# Patient Record
Sex: Female | Born: 1985 | Race: Black or African American | Hispanic: No | Marital: Single | Smoking: Never smoker
Health system: Southern US, Community
[De-identification: ages and names within clinical notes are randomized; demographics above are authoritative.]

## PROBLEM LIST (undated history)

## (undated) ENCOUNTER — Inpatient Hospital Stay (HOSPITAL_COMMUNITY): Payer: Self-pay

## (undated) DIAGNOSIS — R51 Headache: Secondary | ICD-10-CM

## (undated) DIAGNOSIS — J45909 Unspecified asthma, uncomplicated: Secondary | ICD-10-CM

## (undated) DIAGNOSIS — F32A Depression, unspecified: Secondary | ICD-10-CM

## (undated) DIAGNOSIS — F3181 Bipolar II disorder: Secondary | ICD-10-CM

## (undated) DIAGNOSIS — F419 Anxiety disorder, unspecified: Secondary | ICD-10-CM

## (undated) HISTORY — DX: Anxiety disorder, unspecified: F41.9

## (undated) HISTORY — DX: Bipolar II disorder: F31.81

## (undated) HISTORY — DX: Depression, unspecified: F32.A

## (undated) HISTORY — PX: WISDOM TOOTH EXTRACTION: SHX21

---
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
## 2006-01-21 ENCOUNTER — Ambulatory Visit: Payer: Self-pay | Admitting: Sports Medicine

## 2006-01-24 ENCOUNTER — Ambulatory Visit: Payer: Self-pay | Admitting: Family Medicine

## 2006-01-25 ENCOUNTER — Ambulatory Visit: Payer: Self-pay | Admitting: Family Medicine

## 2006-11-14 DIAGNOSIS — N912 Amenorrhea, unspecified: Secondary | ICD-10-CM | POA: Insufficient documentation

## 2007-01-01 ENCOUNTER — Telehealth: Payer: Self-pay | Admitting: *Deleted

## 2007-01-03 ENCOUNTER — Encounter (INDEPENDENT_AMBULATORY_CARE_PROVIDER_SITE_OTHER): Payer: Self-pay | Admitting: Family Medicine

## 2007-01-03 ENCOUNTER — Ambulatory Visit: Payer: Self-pay | Admitting: Family Medicine

## 2007-01-03 LAB — CONVERTED CEMR LAB
Basophils Absolute: 0 10*3/uL (ref 0.0–0.1)
Beta hcg, urine, semiquantitative: POSITIVE
Eosinophils Absolute: 0.1 10*3/uL (ref 0.0–0.7)
Hepatitis B Surface Ag: NEGATIVE
Lymphocytes Relative: 16 % (ref 12–46)
Lymphs Abs: 1.4 10*3/uL (ref 0.7–3.3)
Neutrophils Relative %: 76 % (ref 43–77)
Platelets: 277 10*3/uL (ref 150–400)
Rh Type: POSITIVE
WBC: 8.7 10*3/uL (ref 4.0–10.5)

## 2007-01-04 ENCOUNTER — Encounter (INDEPENDENT_AMBULATORY_CARE_PROVIDER_SITE_OTHER): Payer: Self-pay | Admitting: Family Medicine

## 2007-01-06 ENCOUNTER — Encounter (INDEPENDENT_AMBULATORY_CARE_PROVIDER_SITE_OTHER): Payer: Self-pay | Admitting: Family Medicine

## 2007-01-07 ENCOUNTER — Ambulatory Visit (HOSPITAL_COMMUNITY): Admission: RE | Admit: 2007-01-07 | Discharge: 2007-01-07 | Payer: Self-pay | Admitting: Family Medicine

## 2007-01-07 ENCOUNTER — Encounter (INDEPENDENT_AMBULATORY_CARE_PROVIDER_SITE_OTHER): Payer: Self-pay | Admitting: Family Medicine

## 2007-01-21 ENCOUNTER — Ambulatory Visit: Payer: Self-pay | Admitting: Family Medicine

## 2007-01-21 ENCOUNTER — Encounter (INDEPENDENT_AMBULATORY_CARE_PROVIDER_SITE_OTHER): Payer: Self-pay | Admitting: Family Medicine

## 2007-01-21 ENCOUNTER — Other Ambulatory Visit: Admission: RE | Admit: 2007-01-21 | Discharge: 2007-01-21 | Payer: Self-pay | Admitting: Family Medicine

## 2007-01-23 ENCOUNTER — Ambulatory Visit: Payer: Self-pay | Admitting: Family Medicine

## 2007-01-23 DIAGNOSIS — A5400 Gonococcal infection of lower genitourinary tract, unspecified: Secondary | ICD-10-CM | POA: Insufficient documentation

## 2007-01-23 DIAGNOSIS — A5609 Other chlamydial infection of lower genitourinary tract: Secondary | ICD-10-CM

## 2007-01-23 LAB — CONVERTED CEMR LAB: GC Probe Amp, Genital: POSITIVE — AB

## 2007-01-27 ENCOUNTER — Encounter (INDEPENDENT_AMBULATORY_CARE_PROVIDER_SITE_OTHER): Payer: Self-pay | Admitting: Family Medicine

## 2007-01-27 LAB — CONVERTED CEMR LAB: Pap Smear: NORMAL

## 2007-03-07 ENCOUNTER — Telehealth: Payer: Self-pay | Admitting: *Deleted

## 2007-04-11 ENCOUNTER — Encounter (INDEPENDENT_AMBULATORY_CARE_PROVIDER_SITE_OTHER): Payer: Self-pay | Admitting: *Deleted

## 2007-04-11 ENCOUNTER — Ambulatory Visit: Payer: Self-pay | Admitting: Family Medicine

## 2007-04-11 LAB — CONVERTED CEMR LAB
GC Probe Amp, Genital: NEGATIVE
Glucose, Bld: 76 mg/dL (ref 70–99)
Potassium: 3.9 meq/L (ref 3.5–5.3)
Sodium: 138 meq/L (ref 135–145)

## 2007-04-14 ENCOUNTER — Encounter (INDEPENDENT_AMBULATORY_CARE_PROVIDER_SITE_OTHER): Payer: Self-pay | Admitting: Family Medicine

## 2007-04-14 ENCOUNTER — Ambulatory Visit (HOSPITAL_COMMUNITY): Admission: RE | Admit: 2007-04-14 | Discharge: 2007-04-14 | Payer: Self-pay | Admitting: Family Medicine

## 2007-04-23 ENCOUNTER — Ambulatory Visit: Payer: Self-pay | Admitting: Family Medicine

## 2007-04-29 ENCOUNTER — Ambulatory Visit: Payer: Self-pay | Admitting: Family Medicine

## 2007-05-05 ENCOUNTER — Ambulatory Visit: Payer: Self-pay | Admitting: Obstetrics and Gynecology

## 2007-05-05 ENCOUNTER — Telehealth: Payer: Self-pay | Admitting: Family Medicine

## 2007-05-05 ENCOUNTER — Inpatient Hospital Stay (HOSPITAL_COMMUNITY): Admission: AD | Admit: 2007-05-05 | Discharge: 2007-05-07 | Payer: Self-pay | Admitting: Family Medicine

## 2007-06-12 ENCOUNTER — Ambulatory Visit: Payer: Self-pay | Admitting: Sports Medicine

## 2009-01-18 IMAGING — US US OB FOLLOW-UP
1 series · 14 of 23 positions shown · non-contrast
Comparison: none

OBSTETRICAL ULTRASOUND:

 This ultrasound exam was performed in the [HOSPITAL] Ultrasound Department.  The OB US report was generated in the AS system, and faxed to the ordering physician.  This report is also available in [REDACTED] PACS.

[Series 1: us ob re-eval · 14 of 23 slices shown]
[im 1/23]
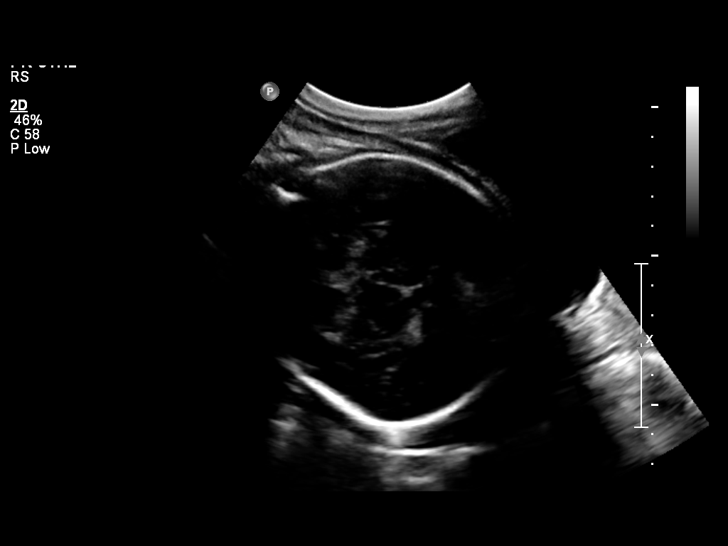
[im 3/23]
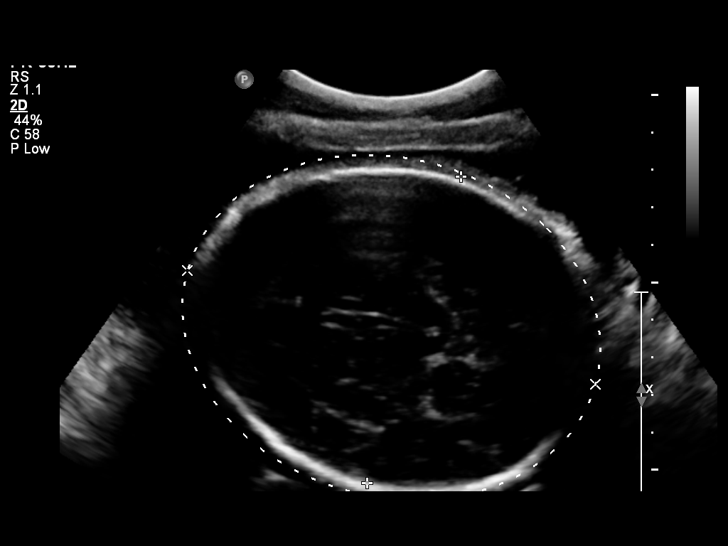
[im 5/23]
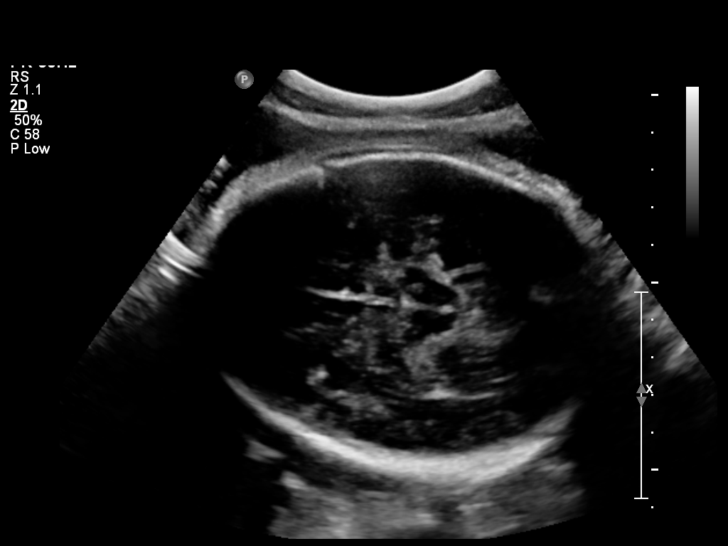
[im 6/23]
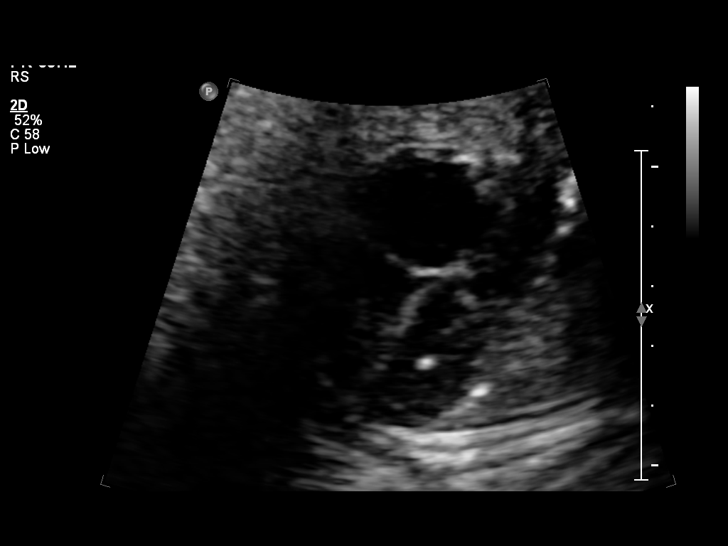
[im 8/23]
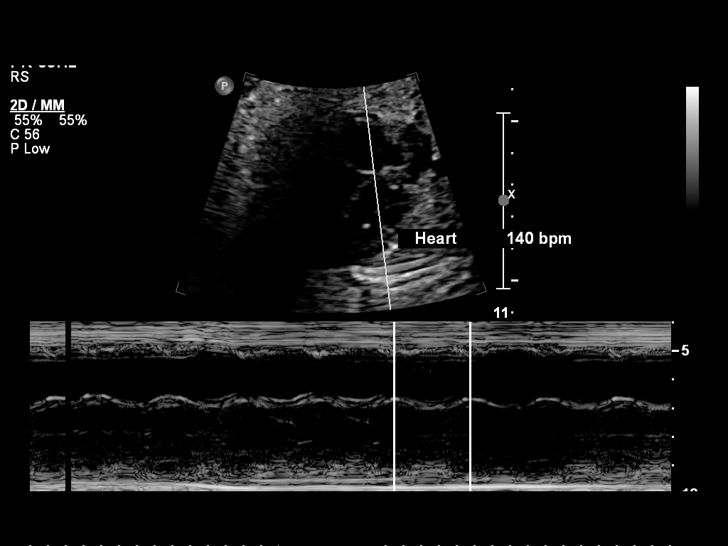
[im 10/23]
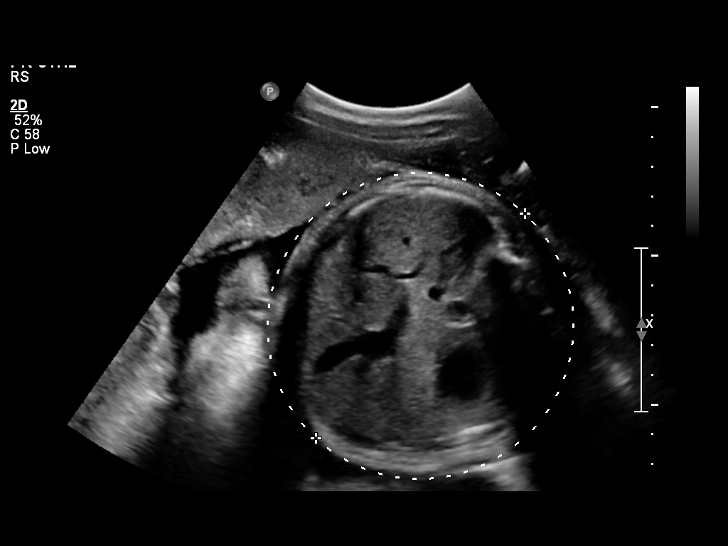
[im 11/23]
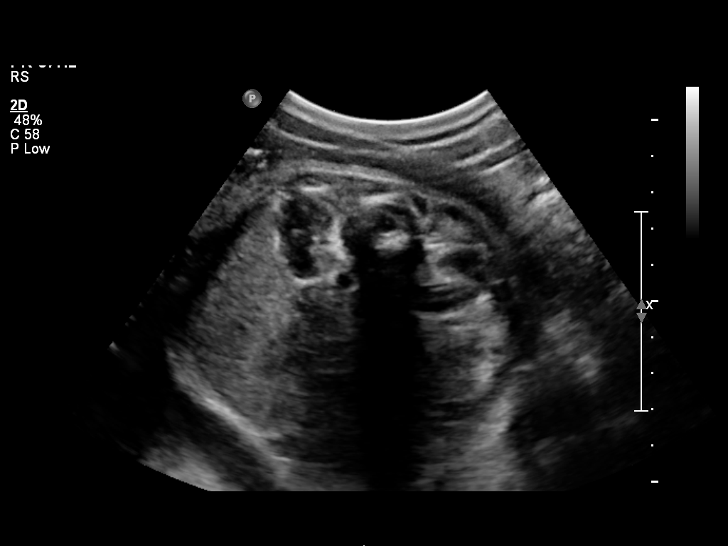
[im 13/23]
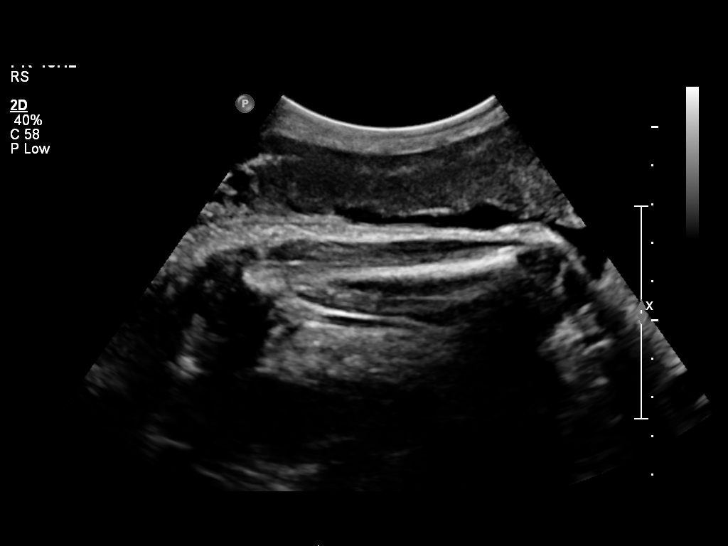
[im 14/23]
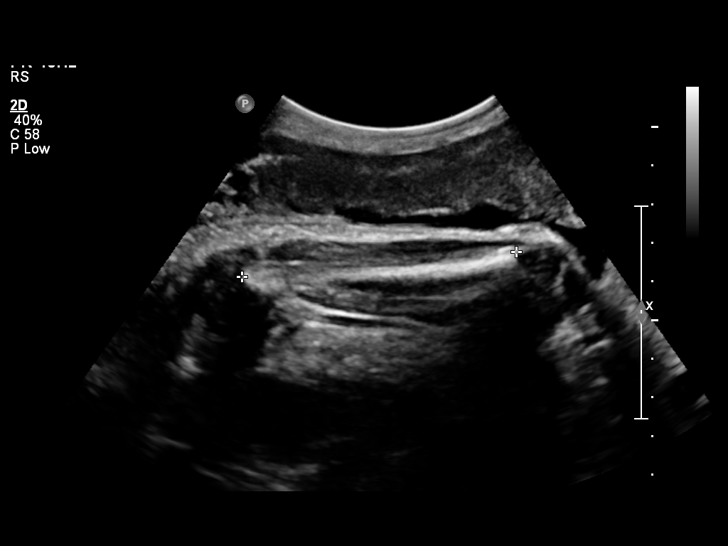
[im 16/23]
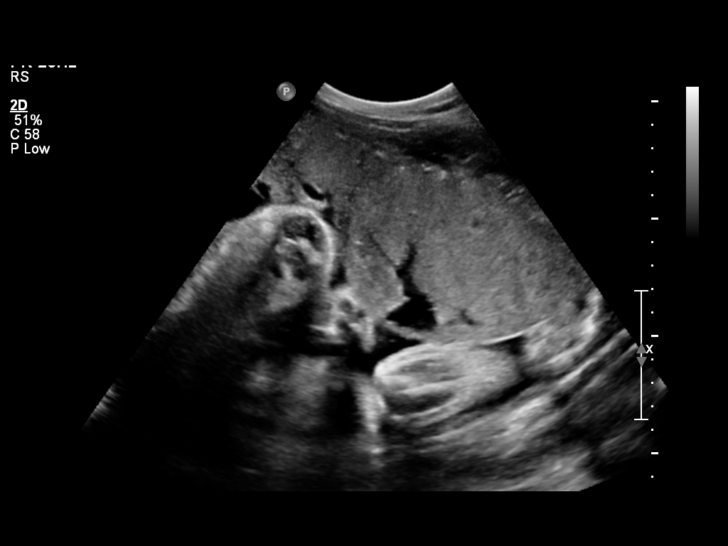
[im 18/23]
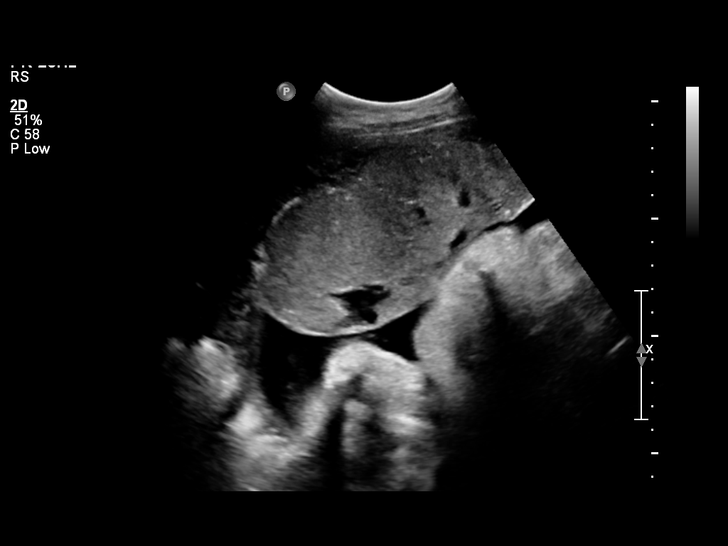
[im 19/23]
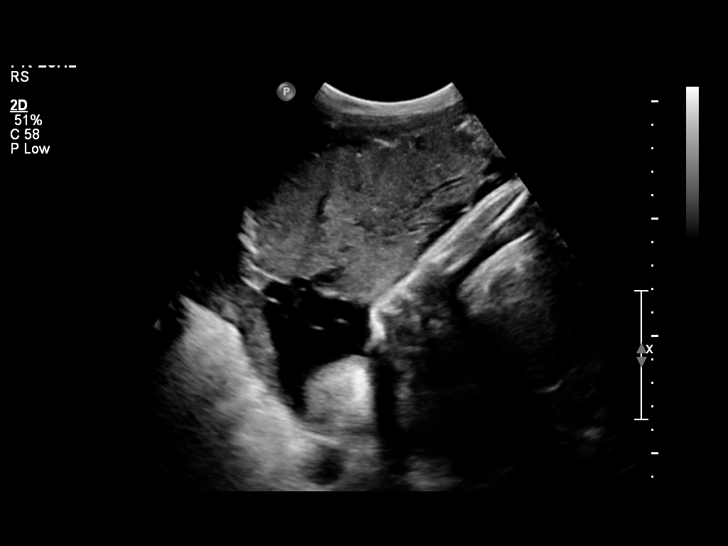
[im 21/23]
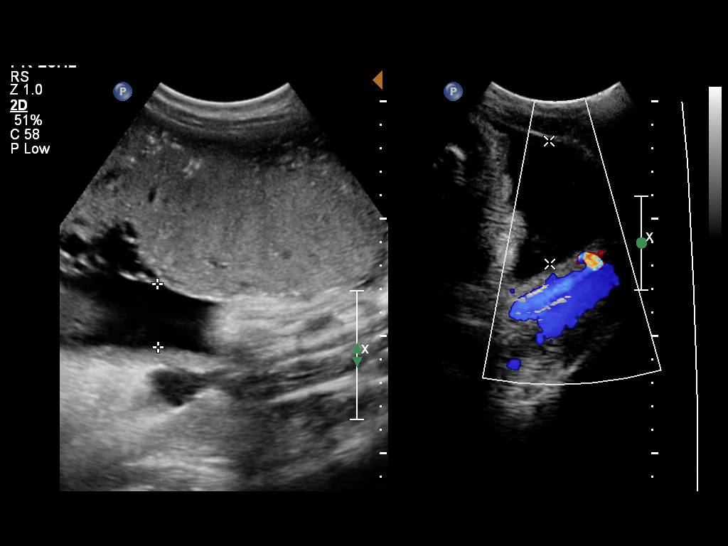
[im 23/23]
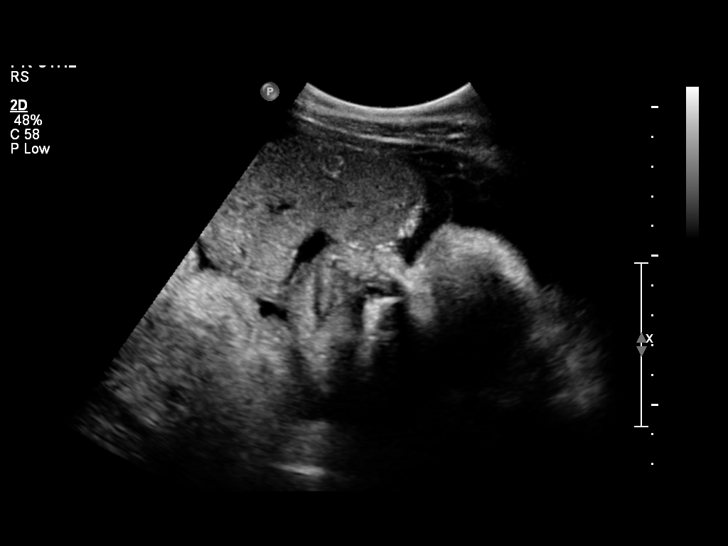

[14 of 23 positions shown; findings below may reference images not displayed]

IMPRESSION: See AS Obstetric US report.

## 2009-02-18 ENCOUNTER — Ambulatory Visit: Payer: Self-pay | Admitting: Family Medicine

## 2009-02-18 ENCOUNTER — Encounter: Payer: Self-pay | Admitting: Family Medicine

## 2009-02-18 LAB — CONVERTED CEMR LAB
Basophils Absolute: 0 10*3/uL (ref 0.0–0.1)
Beta hcg, urine, semiquantitative: POSITIVE
Eosinophils Relative: 1 % (ref 0–5)
HCT: 35.9 % — ABNORMAL LOW (ref 36.0–46.0)
Hepatitis B Surface Ag: NEGATIVE
Lymphocytes Relative: 25 % (ref 12–46)
Lymphs Abs: 1.5 10*3/uL (ref 0.7–4.0)
Neutro Abs: 3.8 10*3/uL (ref 1.7–7.7)
Neutrophils Relative %: 65 % (ref 43–77)
Platelets: 210 10*3/uL (ref 150–400)
RDW: 13.9 % (ref 11.5–15.5)
Rubella: 32.6 intl units/mL — ABNORMAL HIGH
WBC: 5.8 10*3/uL (ref 4.0–10.5)

## 2009-02-25 ENCOUNTER — Encounter (INDEPENDENT_AMBULATORY_CARE_PROVIDER_SITE_OTHER): Payer: Self-pay | Admitting: Family Medicine

## 2009-02-25 ENCOUNTER — Ambulatory Visit: Payer: Self-pay | Admitting: Family Medicine

## 2009-02-25 ENCOUNTER — Encounter: Payer: Self-pay | Admitting: Family Medicine

## 2009-02-25 LAB — CONVERTED CEMR LAB
Chlamydia, DNA Probe: NEGATIVE
Whiff Test: NEGATIVE

## 2009-03-01 ENCOUNTER — Encounter: Payer: Self-pay | Admitting: Family Medicine

## 2009-03-01 ENCOUNTER — Ambulatory Visit (HOSPITAL_COMMUNITY): Admission: RE | Admit: 2009-03-01 | Discharge: 2009-03-01 | Payer: Self-pay | Admitting: Family Medicine

## 2009-03-03 ENCOUNTER — Telehealth: Payer: Self-pay | Admitting: *Deleted

## 2009-03-17 ENCOUNTER — Ambulatory Visit: Payer: Self-pay | Admitting: Family Medicine

## 2009-03-17 ENCOUNTER — Encounter: Payer: Self-pay | Admitting: Family Medicine

## 2009-03-31 ENCOUNTER — Telehealth: Payer: Self-pay | Admitting: Family Medicine

## 2009-04-01 ENCOUNTER — Telehealth: Payer: Self-pay | Admitting: *Deleted

## 2009-04-29 ENCOUNTER — Encounter: Payer: Self-pay | Admitting: Family Medicine

## 2009-04-29 ENCOUNTER — Ambulatory Visit: Payer: Self-pay | Admitting: Family Medicine

## 2009-05-02 LAB — CONVERTED CEMR LAB
HCT: 37.3 % (ref 36.0–46.0)
MCHC: 33.5 g/dL (ref 30.0–36.0)
MCV: 87.4 fL (ref 78.0–100.0)
Platelets: 233 10*3/uL (ref 150–400)
WBC: 5.6 10*3/uL (ref 4.0–10.5)

## 2009-05-11 ENCOUNTER — Encounter: Payer: Self-pay | Admitting: Family Medicine

## 2009-05-11 ENCOUNTER — Ambulatory Visit: Payer: Self-pay | Admitting: Family Medicine

## 2009-05-24 ENCOUNTER — Encounter: Payer: Self-pay | Admitting: Family Medicine

## 2009-05-24 ENCOUNTER — Ambulatory Visit: Payer: Self-pay | Admitting: Family Medicine

## 2009-06-07 ENCOUNTER — Ambulatory Visit: Payer: Self-pay | Admitting: Family Medicine

## 2009-06-07 ENCOUNTER — Encounter: Payer: Self-pay | Admitting: Family Medicine

## 2009-07-01 ENCOUNTER — Ambulatory Visit: Payer: Self-pay | Admitting: Family Medicine

## 2009-07-01 ENCOUNTER — Encounter: Payer: Self-pay | Admitting: Family Medicine

## 2009-07-01 LAB — CONVERTED CEMR LAB: Chlamydia, DNA Probe: NEGATIVE

## 2009-07-06 ENCOUNTER — Inpatient Hospital Stay (HOSPITAL_COMMUNITY): Admission: AD | Admit: 2009-07-06 | Discharge: 2009-07-06 | Payer: Self-pay | Admitting: Family Medicine

## 2009-07-08 ENCOUNTER — Encounter: Payer: Self-pay | Admitting: Family Medicine

## 2009-07-08 ENCOUNTER — Ambulatory Visit: Payer: Self-pay | Admitting: Family Medicine

## 2009-07-10 ENCOUNTER — Inpatient Hospital Stay (HOSPITAL_COMMUNITY): Admission: AD | Admit: 2009-07-10 | Discharge: 2009-07-12 | Payer: Self-pay | Admitting: Family Medicine

## 2009-07-10 ENCOUNTER — Ambulatory Visit: Payer: Self-pay | Admitting: Obstetrics and Gynecology

## 2009-10-18 ENCOUNTER — Telehealth: Payer: Self-pay | Admitting: Family Medicine

## 2009-10-19 ENCOUNTER — Ambulatory Visit: Payer: Self-pay | Admitting: Family Medicine

## 2009-10-19 DIAGNOSIS — B86 Scabies: Secondary | ICD-10-CM

## 2010-03-04 ENCOUNTER — Emergency Department (HOSPITAL_COMMUNITY): Admission: EM | Admit: 2010-03-04 | Discharge: 2010-03-04 | Payer: Self-pay | Admitting: Family Medicine

## 2010-10-17 NOTE — Assessment & Plan Note (Signed)
Summary: rash/Abie/Alm   Vital Signs:  Patient profile:   25 year old female Height:      66 inches Weight:      150.56 pounds BMI:     24.39 Temp:     98.2 degrees F oral Pulse rate:   87 / minute BP sitting:   127 / 80  (right arm) Cuff size:   regular  Vitals Entered By: Garen Grams LPN (October 19, 2009 3:32 PM) CC: rash on arms and chest Is Patient Diabetic? No Pain Assessment Patient in pain? no        Primary Care Provider:  Ancil Boozer  MD  CC:  rash on arms and chest.  History of Present Illness: 1) Rash: Erythematous papules, itchy, diffuse on chest, arms, hands, web spaces, abdomen x 2 weeks. Son here with similar rash, but other household contacts w/o rash. Denies fever, change in lotion or detergent, URI symptoms, lymphadenopathy , weight change, oral lesions, new meds.   Current Medications (verified): 1)  Previte Rx 1 Mg Tabs (Prenatal Ca Carb-B6-B12-Fa) .Marland Kitchen.. 1 Prenatal Vitamin By Mouth Daily 2)  Permethrin 5 % Crea (Permethrin) .... Apply Cream From Head To Toe; Leave On For 8-14 Hours Before Washing Off With Water; May Reapply in 1 Week If Live Mites Appear. Disp 60g 3)  Hydroxyzine Hcl 10 Mg Tabs (Hydroxyzine Hcl) .... One Tab By Mouth Q 8 Hrs As Needed For Itching  Allergies (verified): No Known Drug Allergies  Physical Exam  General:  alert and no acute distress.   Eyes:  no conjunctivitis  Mouth:  no oral lesions  Neck:  no lymphadenopathy   Skin:  diffuse erythematous, excoriated papules on arms chest, abdomen in various stages of healing. few papules in webspaces. no obvious burrows.    Impression & Recommendations:  Problem # 1:  SCABIES (ICD-133.0) Assessment New  Concern for scabies given presentation, contact in home w/ similar symptoms. Will treat with permethrin. Follow up 2 weeks. Second application if not improving in 10 days. Hydroxyzine for itching. Advised regarding care of linens and clothes. HAndout given.   Orders: [facility]- Est  Level  3 ([address])  Complete Medication List: 1)  Previte Rx 1 Mg Tabs (Prenatal ca carb-b6-b12-fa) .Marland Kitchen.. 1 prenatal vitamin by mouth daily 2)  Permethrin 5 % Crea (Permethrin) .... Apply cream from head to toe; leave on for 8-14 hours before washing off with water; may reapply in 1 week if live mites appear. disp 60g 3)  Hydroxyzine Hcl 10 Mg Tabs (Hydroxyzine hcl) .... One tab by mouth q 8 hrs as needed for itching  Patient Instructions: 1)  Wash all clothes, linens etc in hot water and rinse in hot water. 2)  Apply cream to yourself and to [facility], as directed. Wash off in the morning. 3)  If not improved in 10 days can reapply.  4)  Follow up in 2 weeks if not improving  5)  You can take hydroxyzine for itching as needed.  Prescriptions: HYDROXYZINE HCL 10 MG TABS (HYDROXYZINE HCL) one tab by mouth q 8 hrs as needed for itching  #30 x 0   Entered and Authorized by:   Bobby Rumpf  MD   Signed by:   Bobby Rumpf  MD on 10/19/2009   Method used:   Electronically to        [facility]  [address] [phone]* (retail)       [address]  [name], [place]  [id number]       Ph: [phone] or [phone]       Fax: [phone]   RxID:   [id number] PERMETHRIN 5 % CREA (PERMETHRIN) Apply cream from head to toe; leave on for 8-14 hours before washing off with water; may reapply in 1 week if live mites appear. Disp 60g  #60 x 1   Entered and Authorized by:   Bobby Rumpf  MD   Signed by:   Bobby Rumpf  MD on 10/19/2009   Method used:   [facility]        [facility]  [address] [phone][facility] (retail)       [address]       [place], [place]  [id number]       Ph: [phone] or [phone]       Fax: [phone]   RxID:   [id number]

## 2010-10-17 NOTE — Progress Notes (Signed)
Summary: triage   Phone Note Call from Patient Call back at [phone]   Caller: Patient Summary of Call: Baby has rash and seems like everytime she picks up the baby she gets bumps has them on her arms and stomach & now on chest. Initial call taken by: Clydell Hakim,  October 18, 2009 1:53 PM  Follow-up for Phone Call        states she took the child to ED  Sat for this they could not tell what it was. told to see [place]. baby has appt tomorrow. she does nt want to wait until then. placed mom & child in 3pm work in slot. aware of wait Follow-up by: Golden Circle RN,  October 18, 2009 2:03 PM

## 2010-12-03 LAB — POCT PREGNANCY, URINE

## 2010-12-21 LAB — CBC
HCT: 42 % (ref 36.0–46.0)
Hemoglobin: 14.1 g/dL (ref 12.0–15.0)
MCV: 89.9 fL (ref 78.0–100.0)
Platelets: 257 10*3/uL (ref 150–400)
RDW: 13.6 % (ref 11.5–15.5)
WBC: 6.7 10*3/uL (ref 4.0–10.5)

## 2010-12-24 LAB — GLUCOSE, CAPILLARY: Glucose-Capillary: 119 mg/dL — ABNORMAL HIGH (ref 70–99)

## 2010-12-29 ENCOUNTER — Inpatient Hospital Stay (INDEPENDENT_AMBULATORY_CARE_PROVIDER_SITE_OTHER)
Admission: RE | Admit: 2010-12-29 | Discharge: 2010-12-29 | Disposition: A | Discharge status: Home / Self Care | Payer: Self-pay | Source: Ambulatory Visit | Attending: Emergency Medicine | Admitting: Emergency Medicine

## 2010-12-29 ENCOUNTER — Ambulatory Visit: Payer: Self-pay

## 2010-12-29 DIAGNOSIS — H1045 Other chronic allergic conjunctivitis: Secondary | ICD-10-CM

## 2011-01-02 ENCOUNTER — Emergency Department (HOSPITAL_COMMUNITY)
Admission: EM | Admit: 2011-01-02 | Discharge: 2011-01-03 | Disposition: A | Discharge status: Home / Self Care | Payer: Medicaid Other | Attending: Emergency Medicine | Admitting: Emergency Medicine

## 2011-01-02 DIAGNOSIS — H11419 Vascular abnormalities of conjunctiva, unspecified eye: Secondary | ICD-10-CM | POA: Insufficient documentation

## 2011-01-02 DIAGNOSIS — H5789 Other specified disorders of eye and adnexa: Secondary | ICD-10-CM | POA: Insufficient documentation

## 2011-01-02 DIAGNOSIS — H1010 Acute atopic conjunctivitis, unspecified eye: Secondary | ICD-10-CM | POA: Insufficient documentation

## 2011-04-27 ENCOUNTER — Ambulatory Visit (INDEPENDENT_AMBULATORY_CARE_PROVIDER_SITE_OTHER): Payer: Medicaid Other | Admitting: Family Medicine

## 2011-04-27 VITALS — BP 120/84 | HR 93 | Temp 98.6°F | Ht 66.0 in | Wt 144.0 lb

## 2011-04-27 DIAGNOSIS — K219 Gastro-esophageal reflux disease without esophagitis: Secondary | ICD-10-CM

## 2011-04-27 DIAGNOSIS — R079 Chest pain, unspecified: Secondary | ICD-10-CM | POA: Insufficient documentation

## 2011-04-27 HISTORY — DX: Chest pain, unspecified: R07.9

## 2011-04-27 MED ORDER — OMEPRAZOLE 40 MG PO CPDR
40.0000 mg | DELAYED_RELEASE_CAPSULE | Freq: Every day | ORAL | Status: AC
Start: 2011-04-27 — End: 2011-06-07

## 2011-04-27 NOTE — Progress Notes (Signed)
  Subjective:    Patient ID: Sara Manning, female    DOB: [birth year], 25 y.o.   MRN: [id number]  HPI Ms. Sara Manning is a 25 y.o. G2P2 mother who was referred to our clinic for work-up for chest pain that was discovered through a questionnaire the dentist's office. Ms. Sara Manning states that she has intermittent severe chest pain. The most recent episode was in June came on spontaneously as the patient was lying down to sleep. She rates is >10/10, lasting 2-3 minutes in duration with spontaneous remission followed by 2 episodes of short relapse. The pain is centrally located and non-radiating. She denies sweating, nausea, vomiting, jaw pain, back pain, anxiety, racing heart beat or any other symptoms. The episodes happen at night when lying in bed and have occurred a few times during the past year. The first episode was approximately 1 year ago. Of note the patient, did attempt to use her sister's inhaler of an unknown medication to relieve the symptoms. She cannot remember it is helped. She has never sought medical attention and usually notes that she goes to sleep to relieve it. She denies any family history of cardiac disease.    Review of Systems Positive for right sided jaw pain secondary to dental work yesterday and a sore throat of three days duration. Otherwise negative unless stated above.     Objective:   Physical Exam  Constitutional: She appears well-developed and well-nourished. No distress.  HENT:  Head: Normocephalic and atraumatic.  Right Ear: External ear normal.  Left Ear: External ear normal.  Nose: Nose normal.  Mouth/Throat: Oropharynx is clear and moist. No oropharyngeal exudate.       Right sided facial swelling near parotid gland, non-tender, decreased opening of mandible 2/2 swelling  Eyes: Conjunctivae and EOM are normal. Pupils are equal, round, and reactive to light.  Neck: Normal range of motion. No tracheal deviation present. No thyromegaly present.  Cardiovascular: Normal  rate, regular rhythm and normal heart sounds.  Exam reveals no gallop and no friction rub.   No murmur heard. Pulmonary/Chest: Effort normal and breath sounds normal. She has no wheezes. She exhibits no tenderness.  Abdominal: Soft. Bowel sounds are normal. She exhibits no distension and no mass. There is no tenderness. There is no rebound.  Skin: No rash noted.   BP 120/84  Pulse 93  Temp(Src) 98.6 F (37 C) (Oral)  Ht 5\' 6"  (1.676 m)  Wt 144 lb (65.318 kg)  BMI 23.24 kg/m2       Assessment & Plan:  Ms. Sara Manning is a 25 y.o. G33P2 F with intermittent chest pain that is most likely GERD and is medically stable for removal of dental caries. The pharyngitis is potentially related to acid reflux and gives no indication of a bacterial infection. The patient was also counseled on the dangers of using medications not prescribed to her.

## 2011-04-27 NOTE — Patient Instructions (Signed)
Dr. Ms. Dahlia Client,   Thank you for coming in today. I think that your chest pain is the result of acid reflux. Therefore, I am giving you a 4 week prescription of medication that I hope will help you. Please take them as instructed by the pharmacy. I am attaching information about reflux that is important for you to read. If you have sharp chest pain in the future, these are signs that should make you go to the Emergency Department: Pain lasting longer than 20 minutes, Associated sweating, nausea, vomiting, pain that makes you pass out, pain the prevents you from breathing.   Please come back in one month if the pain continues. If it does not return, then please return in 6 months for an annual physical.   Take Care,   Dr. Clinton Sawyer     .

## 2011-04-30 DIAGNOSIS — K219 Gastro-esophageal reflux disease without esophagitis: Secondary | ICD-10-CM | POA: Insufficient documentation

## 2011-04-30 NOTE — Assessment & Plan Note (Addendum)
Most likely cause based upon history and physical is GERD. She does not warrant further work-up for cardiac source at this time. The patient was told to seek medical care if chest pain does recur that lasts longer than 20 minutes, is associated with sweating, nausea, and vomiting or causes loss of consciousness.

## 2011-04-30 NOTE — Assessment & Plan Note (Signed)
We will start a 4 week prescription of omeprazole in an attempt to prevent any recurrent episodes of chest pain. The patient was given information on diet and behavior to reduce the impact of GERD. Please follow-up in 4 weeks if pain recurs.

## 2011-06-22 ENCOUNTER — Ambulatory Visit (INDEPENDENT_AMBULATORY_CARE_PROVIDER_SITE_OTHER): Payer: Self-pay | Admitting: Family Medicine

## 2011-06-22 ENCOUNTER — Encounter: Payer: Self-pay | Admitting: Family Medicine

## 2011-06-22 VITALS — BP 116/56 | HR 87 | Temp 98.2°F | Wt 144.0 lb

## 2011-06-22 DIAGNOSIS — F3132 Bipolar disorder, current episode depressed, moderate: Secondary | ICD-10-CM

## 2011-06-22 MED ORDER — QUETIAPINE FUMARATE 50 MG PO TABS: 50.0000 mg | ORAL_TABLET | Freq: Every day | ORAL | Status: DC

## 2011-06-22 NOTE — Patient Instructions (Signed)
Thank you for coming in today. Try the seroquel at night. It will help you sleep. Give me 1 week to put up with the side effects. The goal of taking medicines is to help you feel better and to deal better with you kids and family.  Make an appointment with Dr. Denyse Amass next week.  Make an appointment with the [facility] for November 21st.  If you feel like hurting yourself or others call 911 or call the clinic or go to the Emergency Room.

## 2011-06-22 NOTE — Assessment & Plan Note (Addendum)
I am strongly suspicious that Sara Manning has bipolar disorder. She is opposed to the idea that she may have bipolar disorder. She is fearful that this will make her "crazy". We had a lengthy discussion with more than 30 minutes of face-to-face time, where we agreed to call this "depression". She is fearful of many of the bipolared medications as her family has been on this and they act "drugged".  We discussed that the goal of medications is to help her feel better and take better care of her children.  She agrees to start Seroquel at a low dose in the evenings.  Warned her about sedation and she agrees to try for one week.  Plan to return to clinic with me in less than one week. Also will make an appointment with mood disorder clinic for November 21. Contract for safety verbally regarding homicidality. Will discuss social workers at next visit. If no show will call child protective services as I am concerned about Sara Manning.  Discussed this case and plan with Dr. Pascal Lux

## 2011-06-22 NOTE — Progress Notes (Signed)
Ms. Dorsainvil presents to clinic today for vitamins and irritability. This is been present for many months but worsened over the past 4-5 months.  She came to clinic today because she is so irritable that she cannot get along with her mother and a short tempered with her children.  She is fearful of bipolar disorder and states that she does not have it.   PHQ9 was extremely positive with 27 with extremely difficult.  Notes some passive homicidal ideation with no plans and has coping mechanisms such as removing herself from a room.  Mood disorder questionnaire was positive for bipolar and she notes that this is a serious problem.  Bipolar spectrum disorder scale is positive.    PMH reviewed. FH Reviewed. Mother and one sister have bipolar. Another sister has schizophrenia SH: No tobacco alcohol or drug use. ROS as above otherwise neg Medications reviewed.   Exam:  BP 116/56  Pulse 87  Temp(Src) 98.2 F (36.8 C) (Oral)  Wt 144 lb (65.318 kg) Gen: Well NAD Psych: Flat affect, tearful at times, poor eye contact. Speech a bit slow. Thought process is linear and goal-directed. Judgment is fair. No delusions or hallucinations expressed. No suicidal ideation. As noted above some passive homicidal ideation. No plan and has reasons not to hurt anyone.

## 2011-06-29 ENCOUNTER — Ambulatory Visit (INDEPENDENT_AMBULATORY_CARE_PROVIDER_SITE_OTHER): Payer: Self-pay | Admitting: Family Medicine

## 2011-06-29 ENCOUNTER — Encounter: Payer: Self-pay | Admitting: Family Medicine

## 2011-06-29 VITALS — BP 120/78 | HR 88 | Wt 146.5 lb

## 2011-06-29 DIAGNOSIS — F3132 Bipolar disorder, current episode depressed, moderate: Secondary | ICD-10-CM

## 2011-06-29 LAB — CBC
HCT: 36.4
Hemoglobin: 12.7
Hemoglobin: 7.9 — CL
Hemoglobin: 8.2 — ABNORMAL LOW
MCHC: 34.7
MCHC: 34.8
MCV: 87.7
Platelets: 191
RBC: 2.54 — ABNORMAL LOW
RBC: 2.68 — ABNORMAL LOW
RDW: 13.7
RDW: 14
WBC: 9.4
WBC: 9.6

## 2011-06-29 LAB — TYPE AND SCREEN: ABO/RH(D): O POS

## 2011-06-29 LAB — RPR: RPR Ser Ql: NONREACTIVE

## 2011-06-29 MED ORDER — QUETIAPINE FUMARATE 200 MG PO TABS: 200.0000 mg | ORAL_TABLET | Freq: Every day | ORAL | Status: DC

## 2011-06-29 NOTE — Progress Notes (Signed)
Seroquel is helping Sara Manning to sleep. She becomes very sedated after taking the medication. She is able to get up in the morning and take care of her children.  Irritibility is a bit improved. No SI/HI.   She is willing to continue taking Seroquel at higher dose.  PMH reviewed.  ROS as above otherwise neg Medications reviewed.  Exam:  BP 120/78  Pulse 88  Wt 146 lb 8 oz (66.452 kg) Gen: Well NAD Psych: Affect is improved today. Improved eye contact is speech is normal linear thought process is also a linear and goal-directed. No SI/HI no hallucinations or delusions

## 2011-06-29 NOTE — Patient Instructions (Signed)
Thank you for coming in today. Start taking 2 of the 50mg  seroquel pills at night until you run out.  Then start taking 1 of the 200mg  pills at night. Let me know if you feel bad or too sleepy.  The sleepiness gets better with this medication.  Your grumpiness should get better slowly.  Come see me in 2-3 weeks.  LET A DOCTOR KNOW IF YOU THINK ABOUT HURTING YOURSELF OR OTHERS.

## 2011-07-02 ENCOUNTER — Encounter: Payer: Self-pay | Admitting: Family Medicine

## 2011-07-02 NOTE — Assessment & Plan Note (Addendum)
Improved today I think. Her Seroquel dose of 50 mg and it wasn't really enough to affect her mood much. She is doing well and 50 mg. Plan to increase to 100 mg at night.  In 2 weeks will increase again to 200 mg. Patient will followup with me in 2-3 weeks. Warned her red flag signs or symptoms for suicidal  or homicidal ideation

## 2011-07-12 ENCOUNTER — Ambulatory Visit (INDEPENDENT_AMBULATORY_CARE_PROVIDER_SITE_OTHER): Payer: Self-pay | Admitting: Family Medicine

## 2011-07-12 ENCOUNTER — Encounter: Payer: Self-pay | Admitting: Family Medicine

## 2011-07-12 VITALS — BP 118/84 | HR 84 | Ht 67.0 in | Wt 148.0 lb

## 2011-07-12 DIAGNOSIS — F3132 Bipolar disorder, current episode depressed, moderate: Secondary | ICD-10-CM

## 2011-07-12 NOTE — Assessment & Plan Note (Signed)
Improved subjectively the patient and to me.  She is tolerating Seroquel well at a therapeutic dose.  Plan to continue current therapy and follow up in 2-3 weeks.  Red flags reviewed.
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
## 2011-07-12 NOTE — Patient Instructions (Signed)
Thank you for coming in today. Please continue the 200mg  dose of Seroquel.  If you need a refill let me know 5 days in advance.  Make an appointment with me in 2-3 weeks and make sure you have that appointment with Dr. Pascal Lux in the [facility].  If you feel like hurting yourself or others let me know. Sugar free sour candy is great for dry mouth.

## 2011-07-17 ENCOUNTER — Ambulatory Visit: Payer: Self-pay | Admitting: Family Medicine

## 2011-07-20 ENCOUNTER — Ambulatory Visit (INDEPENDENT_AMBULATORY_CARE_PROVIDER_SITE_OTHER): Payer: Self-pay | Admitting: Family Medicine

## 2011-07-20 ENCOUNTER — Encounter: Payer: Self-pay | Admitting: Family Medicine

## 2011-07-20 VITALS — BP 119/74 | HR 80 | Temp 98.0°F | Ht 67.0 in | Wt 155.9 lb

## 2011-07-20 DIAGNOSIS — K029 Dental caries, unspecified: Secondary | ICD-10-CM | POA: Insufficient documentation

## 2011-07-20 DIAGNOSIS — K047 Periapical abscess without sinus: Secondary | ICD-10-CM

## 2011-07-20 MED ORDER — HYDROCODONE-ACETAMINOPHEN 5-500 MG PO CAPS: 1.0000 | ORAL_CAPSULE | Freq: Four times a day (QID) | ORAL | Status: AC | PRN

## 2011-07-20 MED ORDER — PENICILLIN V POTASSIUM 250 MG PO TABS: 250.0000 mg | ORAL_TABLET | Freq: Four times a day (QID) | ORAL | Status: DC

## 2011-07-20 NOTE — Progress Notes (Signed)
  Subjective:    Patient ID: Sara Manning, female    DOB: 29-Mar-1986, 25 y.o.   MRN: [id number]  HPI Significant pain on Left side with mandibular pain worse than maxillary pain.  No fever or selling.  Ammenorhea so no LMP but is not sexually active.      Review of Systems     Objective:   Physical Exam No nodes Tender to percussion of last mandibular molar on left Minor tenderness to percussion of last two maxillary molars on Left       Assessment & Plan:

## 2011-07-20 NOTE — Patient Instructions (Addendum)
The antibiotic is at the pharmacy.  Stay on the penicillin until seen by the dentist.  There is one refill The pain medicine is mainly for the first few days until the antibiotics kick in.  After that you can use tylenol or advil for pain Make sure you see the dentist soon.

## 2011-07-20 NOTE — Assessment & Plan Note (Signed)
Pen VK and pain meds.  Must see dentist for definitive treatment.

## 2011-07-30 ENCOUNTER — Telehealth: Payer: Self-pay | Admitting: Family Medicine

## 2011-07-30 ENCOUNTER — Ambulatory Visit: Payer: Self-pay | Admitting: Family Medicine

## 2011-07-30 NOTE — Telephone Encounter (Signed)
Sara Manning called to cancel her appointment for today because her ride can't bring her.  She was not satisfied with any other appointment times that were offered and asked if Dr. Denyse Amass could call her back.

## 2011-07-30 NOTE — Telephone Encounter (Signed)
Unable to reach pt, recording says" out of service area".Sara Manning

## 2011-08-08 ENCOUNTER — Telehealth: Payer: Self-pay | Admitting: Psychology

## 2011-08-08 ENCOUNTER — Ambulatory Visit: Payer: Self-pay | Admitting: Psychology

## 2011-08-08 NOTE — Telephone Encounter (Signed)
Called patient to discuss appointment scheduled for [facility] at 2:00 today.  Patient was scheduled by new front office person who wasn't aware I handled my own scheduling.  There is no [facility] in the afternoon.  Left message with patient's mother that her appt is canceled.  Asked her to call me back to reschedule at her earliest convenience.

## 2011-08-13 ENCOUNTER — Telehealth: Payer: Self-pay | Admitting: Psychology

## 2011-08-13 NOTE — Telephone Encounter (Signed)
Returning VM message Sara Manning left before Thanksgiving holiday.  Left VM.

## 2011-08-13 NOTE — Telephone Encounter (Signed)
Stephanine left another VM to schedule an appt.  Called her back to schedule and had to leave a VM.

## 2011-08-15 NOTE — Telephone Encounter (Signed)
Finally connected to schedule an appt.  Put her in a follow-up slot since Dr. Denyse Amass already assessed and started treatment AND because first new patient slot was not until mid-January.  December 19th at 9:30.    I told her about the nature of the [facility] clinic, specifically, who would be present and that it would focus on her medications.  She wondered why she had to come at all.  Explained that Dr. Denyse Amass wanted to make sure he was headed in the right direction with regards to diagnosis and treatment.  If she is unable to attend the appt, she agreed she would call and stated an understanding that I would have difficulty rescheduling her in that clinic if she did not show.

## 2011-08-16 ENCOUNTER — Ambulatory Visit: Payer: Self-pay | Admitting: Psychology

## 2011-09-05 ENCOUNTER — Ambulatory Visit (INDEPENDENT_AMBULATORY_CARE_PROVIDER_SITE_OTHER): Payer: Self-pay | Admitting: Psychology

## 2011-09-05 DIAGNOSIS — F3132 Bipolar disorder, current episode depressed, moderate: Secondary | ICD-10-CM
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
## 2011-09-13 ENCOUNTER — Ambulatory Visit: Payer: Self-pay | Admitting: Family Medicine

## 2011-09-13 ENCOUNTER — Other Ambulatory Visit: Payer: Self-pay | Admitting: Family Medicine

## 2011-09-13 DIAGNOSIS — F3132 Bipolar disorder, current episode depressed, moderate: Secondary | ICD-10-CM

## 2011-09-13 MED ORDER — QUETIAPINE FUMARATE 200 MG PO TABS: 200.0000 mg | ORAL_TABLET | Freq: Every day | ORAL | Status: DC

## 2011-09-13 NOTE — Telephone Encounter (Signed)
Fwd. To PCP 

## 2011-09-13 NOTE — Telephone Encounter (Signed)
Pt states she is out of seraquil and needs refill - she had to resch her appt today due to her ride having to go to work

## 2011-09-13 NOTE — Telephone Encounter (Signed)
Refilled Seroquel by [facility].

## 2011-09-14 NOTE — Telephone Encounter (Signed)
Pt informed. Fleeger, Jessica Dawn  

## 2011-09-28 ENCOUNTER — Ambulatory Visit (INDEPENDENT_AMBULATORY_CARE_PROVIDER_SITE_OTHER): Payer: Self-pay | Admitting: Family Medicine

## 2011-09-28 ENCOUNTER — Encounter: Payer: Self-pay | Admitting: Family Medicine

## 2011-09-28 ENCOUNTER — Telehealth: Payer: Self-pay | Admitting: *Deleted

## 2011-09-28 VITALS — BP 115/72 | HR 73 | Ht 67.0 in | Wt 153.0 lb

## 2011-09-28 DIAGNOSIS — F3132 Bipolar disorder, current episode depressed, moderate: Secondary | ICD-10-CM

## 2011-09-28 MED ORDER — LAMOTRIGINE 25 MG PO TABS: ORAL_TABLET | ORAL | Status: DC

## 2011-09-28 NOTE — Patient Instructions (Signed)
Thank you for coming in today. Please continue the Seroquel at night. Start lamictal daily for 2 weeks.  After 2 weeks take it twice a day.  Watch out for unexpected rashes.  If you have a rash let me know ASAP.  Let me know if you feel like hurting yourself or others.

## 2011-09-28 NOTE — Telephone Encounter (Signed)
Patient had office visit today with Dr. Denyse Amass.  Completed form to change PCP from Dr. Durene Cal to Dr. Denyse Amass.  Called patient and reviewed chart.  Patient has been followed by Dr. Denyse Amass for bipolar disorder and has never seen Dr. Durene Cal.  Discussed with Dr. Denyse Amass.  Will change PCP in Epic.  Gaylene Brooks, RN

## 2011-09-28 NOTE — Progress Notes (Signed)
Sara Manning is a 26 year old woman who presents to clinic today to followup her bipolar depressive disorder.  In the interim she has been seen in mood disorder clinic.  She felt immediate did not go as well as she had hoped.  She continues to be resistant to the idea that she may have bipolar disorder and does not feel like she has a good therapeutic relationship with Dr. Pascal Lux or Dr. Kathrynn Running.   She has been on and off the Seroquel in the past and is currently on.  She's taking 200 mg at night.  She notes that when she takes Seroquel she is less irritable with her family and she sleeps better.  She denies any suicidal ideation but one moment she did have thoughts of hurting her mother but she took herself out of that situation.  She denies any delusions or hallucinations.   PMH reviewed.  ROS as above otherwise neg Medications reviewed. Current Outpatient Prescriptions  Medication Sig Dispense Refill  . QUEtiapine (SEROQUEL) 200 MG tablet Take 1 tablet (200 mg total) by mouth at bedtime.  30 tablet  1  . lamoTRIgine (LAMICTAL) 25 MG tablet Take 1 pill daily for 2 weeks. Then take 1 pill twice a day for weeks 3-4.  45 tablet  0    Exam:  BP 115/72  Pulse 73  Ht 5\' 7"  (1.702 m)  Wt 153 lb (69.4 kg)  BMI 23.96 kg/m2 Gen: Well NAD Lungs: CTABL Nl WOB Heart: RRR no MRG Psych: Alert and oriented x3.  Slightly depressed affect with some psychomotor retardation.   Speech is normal and thought process is linear and goal-directed.  No suicidality or current homicidality appreciated.  No hallucinations or delusions.

## 2011-09-28 NOTE — Assessment & Plan Note (Signed)
Current bipolar disorder with depression.  She does not like Seroquel due to weight gain.  Currently stable but not at goal.   Plan to continue Seroquel and add Lamictal.  We'll start at 25 mg weekly for 2 weeks increase to 25 mg twice a day and wakes 3-4.  My hope is that Lamictal will help with stabilization and improvement of depression symptoms. We'll followup in one month or sooner if needed.  Red flag precautions reviewed with patient expresses understanding.

## 2011-09-30 ENCOUNTER — Emergency Department (INDEPENDENT_AMBULATORY_CARE_PROVIDER_SITE_OTHER)
Admission: EM | Admit: 2011-09-30 | Discharge: 2011-09-30 | Disposition: A | Discharge status: Home / Self Care | Payer: Medicaid Other | Source: Home / Self Care | Attending: Emergency Medicine | Admitting: Emergency Medicine

## 2011-09-30 ENCOUNTER — Encounter (HOSPITAL_COMMUNITY): Payer: Self-pay | Admitting: *Deleted

## 2011-09-30 DIAGNOSIS — H1045 Other chronic allergic conjunctivitis: Secondary | ICD-10-CM

## 2011-09-30 DIAGNOSIS — H1012 Acute atopic conjunctivitis, left eye: Secondary | ICD-10-CM

## 2011-09-30 MED ORDER — LORATADINE 10 MG PO TABS: 10.0000 mg | ORAL_TABLET | Freq: Every day | ORAL | Status: DC

## 2011-09-30 NOTE — ED Provider Notes (Signed)
History     CSN: [id number]  Arrival date & time 09/30/11  1606   First MD Initiated Contact with Patient 09/30/11 1612      Chief Complaint  Patient presents with  . Eye Drainage  . Facial Swelling    (Consider location/radiation/quality/duration/timing/severity/associated sxs/prior treatment) HPI Comments: Swelling, itching and watering of Lt eye "for a couple of months." Is worse in the evenings. Has only tried warm compresses for symptoms. Denies nasal congestion or sneezing. No mucus or crusting of lashes.    Past Medical History  Diagnosis Date  . Bipolar 2 disorder     History reviewed. No pertinent past surgical history.  Family History  Problem Relation Age of Onset  . Bipolar disorder Mother   . Bipolar disorder Sister   . Schizophrenia Sister     History  Substance Use Topics  . Smoking status: Never Smoker   . Smokeless tobacco: Not on file  . Alcohol Use: No    OB History    Grav Para Term Preterm Abortions TAB SAB Ect Mult Living                  Review of Systems  Constitutional: Negative for fever and chills.  HENT: Negative for ear pain, congestion, rhinorrhea and sneezing.   Eyes: Positive for discharge and itching. Negative for pain and redness.  Respiratory: Negative for cough.     Allergies  Review of patient's allergies indicates no known allergies.  Home Medications   Current Outpatient Rx  Name Route Sig Dispense Refill  . LAMOTRIGINE 25 MG PO TABS  Take 1 pill daily for 2 weeks. Then take 1 pill twice a day for weeks 3-4. 45 tablet 0  . LORATADINE 10 MG PO TABS Oral Take 1 tablet (10 mg total) by mouth daily. 30 tablet 0  . QUETIAPINE FUMARATE 200 MG PO TABS Oral Take 1 tablet (200 mg total) by mouth at bedtime. 30 tablet 1    There were no vitals taken for this visit.  Physical Exam  Nursing note and vitals reviewed. Constitutional: She appears well-developed and well-nourished. No distress.  HENT:  Head: Normocephalic  and atraumatic.  Right Ear: Tympanic membrane, external ear and ear canal normal.  Left Ear: Tympanic membrane, external ear and ear canal normal.  Nose: Mucosal edema (bilat moderately edematous and pale) present.  Mouth/Throat: Uvula is midline, oropharynx is clear and moist and mucous membranes are normal. No oropharyngeal exudate, posterior oropharyngeal edema or posterior oropharyngeal erythema.  Eyes: Conjunctivae, EOM and lids are normal.  Neck: Neck supple.  Cardiovascular: Normal rate, regular rhythm and normal heart sounds.   Pulmonary/Chest: Effort normal and breath sounds normal. No respiratory distress.  Lymphadenopathy:    She has no cervical adenopathy.  Neurological: She is alert.  Skin: Skin is warm and dry.  Psychiatric: She has a normal mood and affect.    ED Course  Procedures (including critical care time)  Labs Reviewed - No data to display No results found.   1. Allergic conjunctivitis of left eye       MDM          Melody Comas, PA 09/30/11 1827

## 2011-09-30 NOTE — ED Notes (Signed)
Left eye with swelling itching onset x months

## 2011-10-01 NOTE — ED Provider Notes (Signed)
Medical screening examination/treatment/procedure(s) were performed by non-physician practitioner and as supervising physician I was immediately available for consultation/collaboration.   [facility]; MD   Sharin Grave, MD 10/01/11 1013

## 2011-10-31 ENCOUNTER — Encounter: Payer: Self-pay | Admitting: Family Medicine

## 2011-10-31 ENCOUNTER — Ambulatory Visit (INDEPENDENT_AMBULATORY_CARE_PROVIDER_SITE_OTHER): Payer: Medicaid Other | Admitting: Family Medicine

## 2011-10-31 ENCOUNTER — Encounter: Payer: Self-pay | Admitting: *Deleted

## 2011-10-31 VITALS — BP 120/78 | Temp 98.1°F | Ht 67.0 in | Wt 157.0 lb

## 2011-10-31 DIAGNOSIS — F3132 Bipolar disorder, current episode depressed, moderate: Secondary | ICD-10-CM

## 2011-10-31 DIAGNOSIS — K089 Disorder of teeth and supporting structures, unspecified: Secondary | ICD-10-CM

## 2011-10-31 DIAGNOSIS — K0889 Other specified disorders of teeth and supporting structures: Secondary | ICD-10-CM | POA: Insufficient documentation

## 2011-10-31 MED ORDER — LAMOTRIGINE 25 MG PO TABS: ORAL_TABLET | ORAL | Status: DC

## 2011-10-31 MED ORDER — TRAMADOL HCL 50 MG PO TABS: 50.0000 mg | ORAL_TABLET | Freq: Three times a day (TID) | ORAL | Status: AC | PRN

## 2011-10-31 MED ORDER — QUETIAPINE FUMARATE 100 MG PO TABS: 100.0000 mg | ORAL_TABLET | Freq: Every day | ORAL | Status: DC

## 2011-10-31 MED ORDER — PENICILLIN V POTASSIUM 500 MG PO TABS: 500.0000 mg | ORAL_TABLET | Freq: Four times a day (QID) | ORAL | Status: DC

## 2011-10-31 NOTE — Assessment & Plan Note (Signed)
Currently doing much better. Plan to titrate Lamictal up to 50 mg twice a day while titrating Seroquel down to 100 mg daily. Discussed rash and suicidal and homicidal ideation the patient expresses understanding. Plan to followup in one month. Goal treatment doses between 100 and  200 mg divided twice daily.

## 2011-10-31 NOTE — Telephone Encounter (Signed)
Opened in error

## 2011-10-31 NOTE — Patient Instructions (Signed)
Thank you for coming in today. 1) Depression: I am decreasing the seroquel to 100mg . Take that every night.  I am increasing the lamictal to 25mg  2 pills twice a day.  2) Dental Pain: Take the penicillin 4x a day and tramadol as needed. Use tylenol for the usual pain and tramadol for extreme pain.  Please call the dentist back. See what they need from [place].   Keep an eye out for unexpected rashes. If you develop one stop lamictal and call me.  Let me know if you feel like hurting yourself or others.

## 2011-10-31 NOTE — Assessment & Plan Note (Signed)
Dental pain due to impacted wisdom tooth. Will require extraction, is currently on the waiting list for likely the dental surgeon.  We'll provide penicillin 4 times a day for infection suppression and tramadol as needed for pain. Plan to followup in one month or sooner if needed.

## 2011-10-31 NOTE — Progress Notes (Signed)
Sara Manning is a 26 y.o. female who presents to [facility] today for   1) bipolar depression: In the interim Lamictal was added. Currently patient is taking 25 mg of Lamictal twice a day. Additionally she is taking Seroquel 200 mg at night. The goal the last visit was to titrate Lamictal while titrating down Seroquel. She wishes to discontinue Seroquel 2 to weight gain. Subjectively she feels much better and notes less irritability. Additionally she is sleeping well and not having any homicidal or suicidal ideation.    2) dental pain due to impacted wisdom tooth. Has had one tooth extracted at the dentist already however has been transferred from one dentist to another and is on the waiting list again for dental extraction. She notes extreme pain especially at night that is not relieved by Tylenol. She denies any fevers chills or significant come her mouth swelling.   PMH reviewed. Significant for bipolar disorder ROS as above otherwise neg Medications reviewed. Current Outpatient Prescriptions  Medication Sig Dispense Refill  . lamoTRIgine (LAMICTAL) 25 MG tablet 2 pills by mouth twice a day.  180 tablet  0  . loratadine (CLARITIN) 10 MG tablet Take 1 tablet (10 mg total) by mouth daily.  30 tablet  0  . QUEtiapine (SEROQUEL) 100 MG tablet Take 1 tablet (100 mg total) by mouth at bedtime.  30 tablet  1  . traMADol (ULTRAM) 50 MG tablet Take 1 tablet (50 mg total) by mouth every 8 (eight) hours as needed for pain.  60 tablet  0  . penicillin v potassium (VEETID) 250 MG tablet Take 1 tablet (250 mg total) by mouth 4 (four) times daily.  40 tablet  1  . penicillin v potassium (VEETID) 500 MG tablet Take 1 tablet (500 mg total) by mouth 4 (four) times daily.  180 tablet  0  . DISCONTD: lamoTRIgine (LAMICTAL) 25 MG tablet Take 1 pill daily for 2 weeks. Then take 1 pill twice a day for weeks 3-4.  45 tablet  0  . DISCONTD: QUEtiapine (SEROQUEL) 200 MG tablet Take 1 tablet (200 mg total) by mouth at  bedtime.  30 tablet  1    Exam:  BP 120/78  Temp(Src) 98.1 F (36.7 C) (Oral)  Ht 5\' 7"  (1.702 m)  Wt 157 lb (71.215 kg)  BMI 24.59 kg/m2 Gen: Well NAD HEENT: EOMI,  MMM impacted left lower wisdom tooth with the gum erythema but no fluctuant areas noted. Tooth is tender to touch Psych: Alert and oriented affect is normal speech is normal thought process is linear and goal-directed area no delusions hallucinations suicidal or homicidal ideation expressed.  Patient smiles today for the first time.

## 2011-11-12 ENCOUNTER — Telehealth: Payer: Self-pay | Admitting: Family Medicine

## 2011-11-12 NOTE — Telephone Encounter (Signed)
Called pt. Waiting for call back. Which medications is pt taking? Need to find out more information. Lorenda Hatchet, Renato Battles

## 2011-11-12 NOTE — Telephone Encounter (Signed)
Patient is calling to let Dr. Denyse Amass know that she has a rash under her arm that she thinks might be from one of the medication he prescribed.

## 2011-11-14 NOTE — Telephone Encounter (Signed)
Patient states she started Lamictal and Seroquel about a month ago.  The rash started  2 weeks ago. Area  under arm to the side of breast  Just a bit smaller than her hand. Describes as bumps and itches.   Denies any  generalized rash elsewhere.   Also started penicillin on 02/13. States she was given 180 tabs.  Will contact MD

## 2011-11-14 NOTE — Telephone Encounter (Signed)
Pt is calling back to speak to nurse

## 2011-11-14 NOTE — Telephone Encounter (Signed)
Consulted with Dr. Denyse Amass  and he states to stop Lamictal and penicillin. Come in for visit tomorrow with Dr. Denyse Amass. Patient notified . Appointment scheduled.

## 2011-11-15 ENCOUNTER — Telehealth: Payer: Self-pay | Admitting: Family Medicine

## 2011-11-15 ENCOUNTER — Encounter: Payer: Self-pay | Admitting: Family Medicine

## 2011-11-15 ENCOUNTER — Ambulatory Visit (INDEPENDENT_AMBULATORY_CARE_PROVIDER_SITE_OTHER): Payer: Medicaid Other | Admitting: Family Medicine

## 2011-11-15 ENCOUNTER — Ambulatory Visit: Payer: Medicaid Other | Admitting: Family Medicine

## 2011-11-15 VITALS — BP 124/76 | Temp 97.8°F | Ht 67.0 in | Wt 158.0 lb

## 2011-11-15 DIAGNOSIS — F3132 Bipolar disorder, current episode depressed, moderate: Secondary | ICD-10-CM

## 2011-11-15 DIAGNOSIS — R21 Rash and other nonspecific skin eruption: Secondary | ICD-10-CM | POA: Insufficient documentation

## 2011-11-15 LAB — POCT SKIN KOH: Skin KOH, POC: NEGATIVE

## 2011-11-15 MED ORDER — TRIAMCINOLONE ACETONIDE 0.1 % EX CREA: TOPICAL_CREAM | Freq: Two times a day (BID) | CUTANEOUS | Status: DC

## 2011-11-15 NOTE — Telephone Encounter (Signed)
Did not make it in today. Rash is getting better. Will try to get in this afternoon or tomorrow.

## 2011-11-15 NOTE — Progress Notes (Signed)
Patient ID: Sara Manning, female   DOB: 07-Oct-1985, 26 y.o.   MRN: [id number] Sara Manning is a 26 y.o. female who presents to [facility] today for rash inferior to left axilla. Rash started 2 weeks ago and is itchy. She has been putting cornstarch on it which has helped some. She scratches the rash a lot.  She called the clinic yesterday and was asked to stop both of her Lamictal and penicillin and come in.  She denies any fevers chills mucocutaneous involvement or feeling bad.   PMH reviewed. Significant for bipolar disorder ROS as above otherwise neg Medications reviewed. Current Outpatient Prescriptions  Medication Sig Dispense Refill  . loratadine (CLARITIN) 10 MG tablet Take 1 tablet (10 mg total) by mouth daily.  30 tablet  0  . QUEtiapine (SEROQUEL) 100 MG tablet Take 1 tablet (100 mg total) by mouth at bedtime.  30 tablet  1  . lamoTRIgine (LAMICTAL) 25 MG tablet 2 pills by mouth twice a day.  180 tablet  0  . penicillin v potassium (VEETID) 250 MG tablet Take 1 tablet (250 mg total) by mouth 4 (four) times daily.  40 tablet  1  . penicillin v potassium (VEETID) 500 MG tablet Take 1 tablet (500 mg total) by mouth 4 (four) times daily.  180 tablet  0  . triamcinolone cream (KENALOG) 0.1 % Apply topically 2 (two) times daily.  30 g  0    Exam:  BP 124/76  Temp(Src) 97.8 F (36.6 C) (Oral)  Ht 5\' 7"  (1.702 m)  Wt 158 lb (71.668 kg)  BMI 24.75 kg/m2 Gen: Well NAD HEENT: EOMI,  MMM, normal mucosa no lesions Lungs: CTABL Nl WOB Heart: RRR no MRG Abd: NABS, NT, ND Exts: Non edematous BL  LE, warm and well perfused.  Skin: Hypopigmented excoriated plaque inferior to left axilla.  No other lesions. Nontender Psych; pleasant smiling happy, children. Process linear and goal-directed speech is normal affect is normal.  Skin KOH showed no evidence of fungus, or yeast.

## 2011-11-15 NOTE — Patient Instructions (Signed)
Thank you for coming in today. Please Stop but save the lamictal.  Stop Penicillin.  Continue taking seroquel 200mg  at bedtime.  Stop putting corn starch on your rash.  See me in 1 month or sooner if you worsen.

## 2011-11-15 NOTE — Assessment & Plan Note (Signed)
Isolated rash likely dermatitis very unlikely to be drug eruption or due to Lamictal.  Patient has no new cutaneous involvement the rash is small and she has no systemic signs or symptoms. Plan to hold Lamictal and penicillin, and treat with triamcinolone.  If no improvement or worsening asked patient to return.  Discussed with the specific signs or symptoms of Stevens-Johnson such as fevers chills feeling bad or involvement of her mouth, eyes, anus, or vagina. She expresses understanding.

## 2011-11-15 NOTE — Assessment & Plan Note (Signed)
This rash is a setback for the treatment of her bipolar disorder. Lamictal appeared to be working.   Will stop Lamictal for at least one month. We'll continue Seroquel and increase to 200 mg in the interim.  Followup in 2-4 weeks, or sooner if worse.

## 2011-11-19 ENCOUNTER — Other Ambulatory Visit: Payer: Self-pay | Admitting: Family Medicine

## 2011-11-20 ENCOUNTER — Telehealth: Payer: Self-pay | Admitting: *Deleted

## 2011-11-20 NOTE — Telephone Encounter (Signed)
Refill request

## 2011-11-20 NOTE — Telephone Encounter (Signed)
PA required for Quetiapine Fumarate. Form placed in MD box.

## 2011-11-21 NOTE — Telephone Encounter (Signed)
Approval received and pharmacy notified. 

## 2011-11-21 NOTE — Telephone Encounter (Signed)
Form filled out and placed in to be faxed box.  

## 2011-11-23 NOTE — Telephone Encounter (Signed)
Refill request

## 2011-11-29 ENCOUNTER — Ambulatory Visit: Payer: Medicaid Other | Admitting: Family Medicine

## 2011-12-03 ENCOUNTER — Encounter: Payer: Self-pay | Admitting: Family Medicine

## 2011-12-03 ENCOUNTER — Ambulatory Visit (INDEPENDENT_AMBULATORY_CARE_PROVIDER_SITE_OTHER): Payer: Self-pay | Admitting: Family Medicine

## 2011-12-03 VITALS — BP 127/85 | HR 86 | Temp 97.8°F | Ht 67.0 in | Wt 166.0 lb

## 2011-12-03 DIAGNOSIS — K0889 Other specified disorders of teeth and supporting structures: Secondary | ICD-10-CM

## 2011-12-03 DIAGNOSIS — K089 Disorder of teeth and supporting structures, unspecified: Secondary | ICD-10-CM

## 2011-12-03 MED ORDER — ACETAMINOPHEN-CODEINE #3 300-30 MG PO TABS: 1.0000 | ORAL_TABLET | ORAL | Status: AC | PRN

## 2011-12-03 NOTE — Patient Instructions (Signed)
Follow-up with the dentist. If you start having any fevers and chills, upset stomach, nausea, come back and see [place].

## 2011-12-06 NOTE — Assessment & Plan Note (Signed)
No signs of abscess today.  Does have poor dental hygeine with multiple caries. No need for antibiotics today. Plan to treat pain with Tylenol #3.  Encouraged to follow-up with dentist.

## 2011-12-06 NOTE — Progress Notes (Signed)
  Subjective:    Patient ID: Sara Manning, female    DOB: February 23, 1986, 26 y.o.   MRN: [id number]  HPI  1.  Left sided face pain:  Present for 1-2 weeks.  Has been told she had dental infection in past and was on PCN. She is currently taking Tramadol but states this does not help pain.  She has appt set up to see a dentist in coming weeks but doesn't think she can make it to then due to her pain.  No fevers or chills.  No nausea or vomiting.  No drainage from teeth.  Eating and drinking well.    Review of Systems See HPI above for review of systems.       Objective:   Physical Exam    BP 127/85  Pulse 86  Temp(Src) 97.8 F (36.6 C) (Oral)  Ht 5\' 7"  (1.702 m)  Wt 166 lb (75.297 kg)  BMI 26.00 kg/m2 Gen:  Patient sitting on exam table, appears stated age in no acute distress Head: Normocephalic atraumatic Eyes: EOMI, PERRL, sclera and conjunctiva non-erythematous Nose:  Nares patent Mouth: Mucosa membranes moist. Tonsils +2, nonenlarged, non-erythematous.  Unable to identify any areas of fluctuance, abscess, or infection.  TTP along Left maxillary sinus and just inferior to this.   Ears:  Clear with good TM's BL Neck: No cervical lymphadenopathy noted Heart:  RRR, no murmurs auscultated. Pulm:  Clear to auscultation bilaterally with good air movement.  No wheezes or rales noted.         Assessment & Plan:

## 2011-12-11 ENCOUNTER — Emergency Department (INDEPENDENT_AMBULATORY_CARE_PROVIDER_SITE_OTHER)
Admission: EM | Admit: 2011-12-11 | Discharge: 2011-12-11 | Disposition: A | Discharge status: Home / Self Care | Payer: Medicaid Other | Source: Home / Self Care | Attending: Family Medicine | Admitting: Family Medicine

## 2011-12-11 ENCOUNTER — Encounter (HOSPITAL_COMMUNITY): Payer: Self-pay | Admitting: *Deleted

## 2011-12-11 DIAGNOSIS — R51 Headache: Secondary | ICD-10-CM

## 2011-12-11 MED ORDER — HYDROCODONE-ACETAMINOPHEN 5-325 MG PO TABS: ORAL_TABLET | ORAL | Status: AC
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
## 2011-12-24 ENCOUNTER — Other Ambulatory Visit: Payer: Self-pay | Admitting: Family Medicine

## 2012-01-16 ENCOUNTER — Ambulatory Visit: Payer: Medicaid Other | Admitting: Family Medicine

## 2012-01-28 ENCOUNTER — Other Ambulatory Visit: Payer: Self-pay | Admitting: Family Medicine

## 2012-02-13 ENCOUNTER — Ambulatory Visit: Payer: Medicaid Other | Admitting: Family Medicine

## 2012-02-28 ENCOUNTER — Ambulatory Visit (INDEPENDENT_AMBULATORY_CARE_PROVIDER_SITE_OTHER): Payer: Medicaid Other | Admitting: Family Medicine

## 2012-02-28 ENCOUNTER — Encounter: Payer: Self-pay | Admitting: Family Medicine

## 2012-02-28 VITALS — BP 123/84 | HR 81 | Ht 67.0 in | Wt 178.9 lb

## 2012-02-28 DIAGNOSIS — F3132 Bipolar disorder, current episode depressed, moderate: Secondary | ICD-10-CM

## 2012-02-28 DIAGNOSIS — K089 Disorder of teeth and supporting structures, unspecified: Secondary | ICD-10-CM

## 2012-02-28 DIAGNOSIS — K0889 Other specified disorders of teeth and supporting structures: Secondary | ICD-10-CM

## 2012-02-28 MED ORDER — LAMOTRIGINE 100 MG PO TABS: 100.0000 mg | ORAL_TABLET | Freq: Every day | ORAL | Status: DC

## 2012-02-28 MED ORDER — HYDROCODONE-ACETAMINOPHEN 5-500 MG PO TABS: 1.0000 | ORAL_TABLET | Freq: Three times a day (TID) | ORAL | Status: AC | PRN

## 2012-02-28 MED ORDER — QUETIAPINE FUMARATE 25 MG PO TABS: 25.0000 mg | ORAL_TABLET | Freq: Every day | ORAL | Status: DC

## 2012-02-28 NOTE — Patient Instructions (Addendum)
Thank you for coming in today. Please go back to the dentist about your pain.  In the mean time Vicodin 30 tablets with 1 refill. Try to use this med as a backup.   For bipolar. Decrease Seroquel to 25mg  at night.  Increase lamictal to 100mg  daily.  Come back in 1 month.  You may need a different medicine than Seroquel if your mood changes.

## 2012-02-29 ENCOUNTER — Encounter: Payer: Self-pay | Admitting: Family Medicine

## 2012-02-29 NOTE — Assessment & Plan Note (Signed)
Dental pain. I'm not sure where this is coming from. She may have retained bit of wisdom tooth or a dental abscess that I cannot see easily.  Plan to use Vicodin and have patient followup with her dentist

## 2012-02-29 NOTE — Assessment & Plan Note (Signed)
Doing well of bipolar disorder. However patient has gained approximately 25 pounds on Lamictal.  I feel that we need Lamictal is reasonable her. Plan to decrease Seroquel to 25 or 50 mg and increase Lamictal to 100 mg. Followup in one month or sooner. May need to switch to Abilify if manic symptoms that develop off Seroquel

## 2012-02-29 NOTE — Progress Notes (Signed)
Sara Manning is a 26 y.o. female who presents to [facility] today for   1) bipolar: Patient is doing much better with bipolar control currently. She is much was irritable with her family and is much happier.  She is sleeping well and not having any manic symptoms such as pressured speech more activities delusions or hallucinations.  She denies any SI/HI.  She is currently taking 50 mg of Lamictal and 100 mg of Seroquel.   However she has gained quite a bit of weight and is unhappy with Seroquel for this reason. Her appetite has been dramatically increased on Seroquel.  2) tooth pain: Patient had extraction of wisdom teeth approximately 1 or 2 months ago. She is continued pain of the right lower jaw since then and has run out of pain medicine.  She has an appointment with her dentist in the end of July.  She feels well without any fevers chills or trouble breathing or swallowing.   PMH: Reviewed significant for bipolar disorder History  Substance Use Topics  . Smoking status: Never Smoker   . Smokeless tobacco: Not on file  . Alcohol Use: No   ROS as above  Medications reviewed. Current Outpatient Prescriptions  Medication Sig Dispense Refill  . lamoTRIgine (LAMICTAL) 100 MG tablet Take 1 tablet (100 mg total) by mouth daily.  30 tablet  2  . QUEtiapine (SEROQUEL) 25 MG tablet Take 1 tablet (25 mg total) by mouth at bedtime.  30 tablet  1  . triamcinolone cream (KENALOG) 0.1 % Apply topically 2 (two) times daily.  30 g  0  . HYDROcodone-acetaminophen (VICODIN) 5-500 MG per tablet Take 1 tablet by mouth every 8 (eight) hours as needed for pain.  30 tablet  1  . loratadine (CLARITIN) 10 MG tablet Take 1 tablet (10 mg total) by mouth daily.  30 tablet  0    Exam:  BP 123/84  Pulse 81  Ht 5\' 7"  (1.702 m)  Wt 178 lb 14.4 oz (81.149 kg)  BMI 28.02 kg/m2 Wt Readings from Last 3 Encounters:  02/28/12 178 lb 14.4 oz (81.149 kg)  12/03/11 166 lb (75.297 kg)  11/15/11 158 lb (71.668 kg)    Gen:  Well NAD HEENT: EOMI,  MMM, no obvious fractured teeth or large dental caries or abscess of the oral cavity.  No masses of the neck or jaw. Lungs: CTABL Nl WOB Heart: RRR no MRG Abd: NABS, NT, ND Exts: Non edematous BL  LE, warm and well perfused.  Psych: Happy and smiling affect,. Normal thought process and speech. No SI/HI  No results found for this or any previous visit (from the past 72 hour(s)).

## 2012-04-09 ENCOUNTER — Encounter: Payer: Self-pay | Admitting: Family Medicine

## 2012-04-09 ENCOUNTER — Ambulatory Visit (INDEPENDENT_AMBULATORY_CARE_PROVIDER_SITE_OTHER): Payer: Self-pay | Admitting: Family Medicine

## 2012-04-09 VITALS — BP 122/78 | HR 88 | Temp 98.7°F | Ht 66.0 in | Wt 176.0 lb

## 2012-04-09 DIAGNOSIS — K0889 Other specified disorders of teeth and supporting structures: Secondary | ICD-10-CM

## 2012-04-09 DIAGNOSIS — K089 Disorder of teeth and supporting structures, unspecified: Secondary | ICD-10-CM

## 2012-04-09 DIAGNOSIS — R110 Nausea: Secondary | ICD-10-CM

## 2012-04-09 MED ORDER — HYDROCODONE-ACETAMINOPHEN 5-325 MG PO TABS: 1.0000 | ORAL_TABLET | Freq: Four times a day (QID) | ORAL | Status: DC | PRN

## 2012-04-09 MED ORDER — IBUPROFEN 800 MG PO TABS: 800.0000 mg | ORAL_TABLET | Freq: Three times a day (TID) | ORAL | Status: AC | PRN

## 2012-04-09 MED ORDER — HYDROCODONE-ACETAMINOPHEN 5-325 MG PO TABS: 1.0000 | ORAL_TABLET | Freq: Four times a day (QID) | ORAL | Status: AC | PRN

## 2012-04-09 MED ORDER — HYDROCODONE-ACETAMINOPHEN 10-325 MG PO TABS: 1.0000 | ORAL_TABLET | Freq: Three times a day (TID) | ORAL | Status: DC | PRN

## 2012-04-09 MED ORDER — PROMETHAZINE HCL 12.5 MG PO TABS: 12.5000 mg | ORAL_TABLET | Freq: Three times a day (TID) | ORAL | Status: DC | PRN

## 2012-04-09 NOTE — Progress Notes (Signed)
  Subjective:    Patient ID: Sara Manning, female    DOB: 08-24-1986, 26 y.o.   MRN: [id number]  HPI  Headache - bilateral frontal, 4 days duration, present upon wakening, associated with tooth pain on the right side, associated with vomiting that was tinged with blood, history of having wisdom teeth pulled several months ago and since that time claims to have right sided tooth pain specifically located in the 2nd molar; alleviated by oxycodone which was prescribed by Dr. Denyse Amass;  She denies swelling in her face and fever   Review of Systems  All other systems reviewed and are negative.       Objective:   Physical Exam  Constitutional: She is oriented to person, place, and time. She appears well-developed and well-nourished. No distress.  HENT:  Mouth/Throat: Uvula is midline, oropharynx is clear and moist and mucous membranes are normal. Normal dentition. No dental abscesses, uvula swelling or dental caries.       No tenderness to palpation of molars  Neurological: She is alert and oriented to person, place, and time. No cranial nerve deficit.  Skin: Skin is warm and dry. She is not diaphoretic.  Psychiatric: She has a normal mood and affect. Her behavior is normal.    BP 122/78  Pulse 88  Temp 98.7 F (37.1 C) (Oral)  Ht 5\' 6"  (1.676 m)  Wt 176 lb (79.833 kg)  BMI 28.41 kg/m2       Assessment & Plan:  26 year old female with subacute right molar pain of undetermined origin.

## 2012-04-09 NOTE — Patient Instructions (Signed)
Dear Mrs. Beshara,   Thank you for coming to clinic today. Please read below regarding the issues that we discussed.   1. Dental Pain - This is likely the cause of your headache. I did not see any evidence of abscess or cavity today. I will give you a prescription for pain medication. Ibuprofen should be taken every 8 hours with Norco for break through pain. Long term use of opioids like Norco or Vicodin is not a good solution. Please schedule an appointment with your dentist.   2. Nausea and vomiting - This is likely due to the headache. Please dry he is in next time you had nausea.   Please follow up in clinic in 4 weeks . Please call earlier if you have any questions or concerns.   Sincerely,   Dr. Clinton Sawyer

## 2012-04-10 NOTE — Assessment & Plan Note (Signed)
There is no evidence on physical exam of an abscess, tooth fracture, or frank tooth decay.This needs to be evaluated by a dentist, and I don't feel comfortable having her on daily narcotics for dental pain of unknown origin when she has a dentist. I prescribed her 20 Norco for breakthrough pain and ibuprofen for pain control. She agreed to follow up with the dentist.

## 2012-04-28 ENCOUNTER — Ambulatory Visit: Payer: Self-pay | Admitting: Family Medicine

## 2012-04-30 ENCOUNTER — Ambulatory Visit (INDEPENDENT_AMBULATORY_CARE_PROVIDER_SITE_OTHER): Payer: Medicaid Other | Admitting: Family Medicine

## 2012-04-30 ENCOUNTER — Encounter: Payer: Self-pay | Admitting: Family Medicine

## 2012-04-30 VITALS — BP 133/89 | HR 84 | Ht 66.0 in | Wt 175.0 lb

## 2012-04-30 DIAGNOSIS — F3132 Bipolar disorder, current episode depressed, moderate: Secondary | ICD-10-CM

## 2012-04-30 MED ORDER — QUETIAPINE FUMARATE 25 MG PO TABS: 25.0000 mg | ORAL_TABLET | Freq: Every day | ORAL | Status: DC

## 2012-04-30 MED ORDER — LAMOTRIGINE 100 MG PO TABS: 100.0000 mg | ORAL_TABLET | Freq: Every day | ORAL | Status: DC

## 2012-04-30 NOTE — Patient Instructions (Addendum)
Thank you for coming in today, it was nice to meet you I have sent in refills for your medications For your tooth pain you may try over the counter ibuprofen (advil, motrin) I will see you back in 2-3 months

## 2012-05-04 NOTE — Progress Notes (Signed)
  Subjective:    Patient ID: Sara Manning, female    DOB: January 03, 1986, 26 y.o.   MRN: [id number]  HPI Patient her to meet new physician and follow up for   1. Bipolar disorder:  Has been out of medications for a couple of days.  Was being followed in [facility], however she says that "they play too many head games" and does not want to go back there. She is not followed by other psychiatrist/psychologist. She feels like her lamictal and seroquel have been working well to keep her symptoms under control.  She does not feel over sedated on medication.  She denies difficulty sleeping, money management or thoughts of hurting herself or anyone else.    Review of Systems Per HPI    Objective:   Physical Exam  Constitutional: She appears well-nourished. No distress.  Cardiovascular: Normal rate and regular rhythm.   Pulmonary/Chest: Effort normal and breath sounds normal.  Psychiatric: She has a normal mood and affect. Her behavior is normal. Judgment and thought content normal.          Assessment & Plan:

## 2012-05-04 NOTE — Assessment & Plan Note (Addendum)
Stable with last change in medication.  Weight stable as well, with decrease in seroquel. No current symptoms of mania.  F/u in 2-3 months or sooner as needed.

## 2012-06-11 ENCOUNTER — Encounter: Payer: Self-pay | Admitting: Family Medicine

## 2012-06-11 ENCOUNTER — Ambulatory Visit (INDEPENDENT_AMBULATORY_CARE_PROVIDER_SITE_OTHER): Payer: Medicaid Other | Admitting: Family Medicine

## 2012-06-11 VITALS — BP 110/75 | HR 66 | Temp 98.0°F | Ht 66.0 in | Wt 169.0 lb

## 2012-06-11 DIAGNOSIS — R51 Headache: Secondary | ICD-10-CM | POA: Insufficient documentation

## 2012-06-11 DIAGNOSIS — G43909 Migraine, unspecified, not intractable, without status migrainosus: Secondary | ICD-10-CM

## 2012-06-11 DIAGNOSIS — Z23 Encounter for immunization: Secondary | ICD-10-CM

## 2012-06-11 DIAGNOSIS — R519 Headache, unspecified: Secondary | ICD-10-CM | POA: Insufficient documentation

## 2012-06-11 MED ORDER — KETOROLAC TROMETHAMINE 30 MG/ML IJ SOLN
30.0000 mg | Freq: Once | INTRAMUSCULAR | Status: AC
  Administered 2012-06-11: 30 mg via INTRAMUSCULAR

## 2012-06-11 MED ORDER — DIPHENHYDRAMINE HCL 50 MG/ML IJ SOLN
25.0000 mg | Freq: Once | INTRAMUSCULAR | Status: AC
  Administered 2012-06-11: 25 mg via INTRAMUSCULAR

## 2012-06-11 MED ORDER — DEXAMETHASONE SODIUM PHOSPHATE 10 MG/ML IJ SOLN
10.0000 mg | Freq: Once | INTRAMUSCULAR | Status: AC
  Administered 2012-06-11: 10 mg via INTRAMUSCULAR

## 2012-06-11 NOTE — Progress Notes (Signed)
Patient ID: LANGSTON SUMMERFIELD, female   DOB: 09/08/1986, 26 y.o.   MRN: [id number] SUBJECTIVE: KIANTE PETROVICH is a 26 y.o. female who complains of headaches for 2 month(s) off and on. Description of pain: squeezing pain, bilateral in the frontal area. Duration of individual headaches: 1-2  day(s), frequency weekly. Associated symptoms: light sensitivity and nausea. Pain relief: unable to obtain relief with OTC meds, lying in a darkened room and sleep. Precipitating factors: patient is aware of none. She denies a history of recent head injury.  Prior neurological history: negative for no neurological problems. Review of Systems - no TIA or stroke-like symptoms, vision changes, fever, chills, sinus pressure, congestion.  Scheduled Meds:   . dexamethasone  10 mg Intramuscular Once  . diphenhydrAMINE  25 mg Intramuscular Once  . ketorolac  30 mg Intramuscular Once   Continuous Infusions:  PRN Meds:   OBJECTIVE: BP 110/75  Pulse 66  Temp 98 F (36.7 C) (Oral)  Ht 5\' 6"  (1.676 m)  Wt 169 lb (76.658 kg)  BMI 27.28 kg/m2  Appearance: alert, well appearing, and in no distress and Prefers to keep eyes closed. Neurological Exam: alert, oriented, normal speech, no focal findings or movement disorder noted, neck supple without rigidity, cranial nerves II through XII intact, motor and sensory grossly normal bilaterally, normal muscle tone, no tremors, strength 5/5, Romberg sign negative, normal gait and station, Funduscopic exam: unable to visualize disc.

## 2012-06-11 NOTE — Assessment & Plan Note (Signed)
ASSESSMENT: Features sound like mixed tension and migraine headache, however she has no known hx of migraines.  Denies increased stress, sinus congestion, vision changes.  PLAN: Recommendations: lie in darkened room and apply cold packs prn for pain and patient reassured that neurodiagnostic workup not indicated from benign H & P. Given injections of toradol, decadron, and benadryl.  Instructed to return if worsening or unresolving.

## 2012-07-02 ENCOUNTER — Encounter: Payer: Self-pay | Admitting: Family Medicine

## 2012-07-02 ENCOUNTER — Ambulatory Visit (INDEPENDENT_AMBULATORY_CARE_PROVIDER_SITE_OTHER): Payer: Medicaid Other | Admitting: Family Medicine

## 2012-07-02 VITALS — BP 114/78 | HR 70 | Temp 98.1°F | Ht 67.0 in | Wt 170.0 lb

## 2012-07-02 DIAGNOSIS — K089 Disorder of teeth and supporting structures, unspecified: Secondary | ICD-10-CM

## 2012-07-02 DIAGNOSIS — K0889 Other specified disorders of teeth and supporting structures: Secondary | ICD-10-CM

## 2012-07-02 MED ORDER — IBUPROFEN 600 MG PO TABS: 600.0000 mg | ORAL_TABLET | Freq: Three times a day (TID) | ORAL | Status: DC | PRN

## 2012-07-02 MED ORDER — PENICILLIN V POTASSIUM 250 MG PO TABS: 250.0000 mg | ORAL_TABLET | Freq: Three times a day (TID) | ORAL | Status: DC

## 2012-07-02 NOTE — Patient Instructions (Addendum)
You could have an early infection in tooth. I do not see any bad signs currently. You may take antibiotic if worsens. You can try motrin for pain, this is anti-inflammatory. Make sure to get into the dentist ASAP.  Dental Pain A tooth ache may be caused by cavities (tooth decay). Cavities expose the nerve of the tooth to air and hot or cold temperatures. It may come from an infection or abscess (also called a boil or furuncle) around your tooth. It is also often caused by dental caries (tooth decay). This causes the pain you are having. DIAGNOSIS  Your caregiver can diagnose this problem by exam. TREATMENT   If caused by an infection, it may be treated with medications which kill germs (antibiotics) and pain medications as prescribed by your caregiver. Take medications as directed.  Only take over-the-counter or prescription medicines for pain, discomfort, or fever as directed by your caregiver.  Whether the tooth ache today is caused by infection or dental disease, you should see your dentist as soon as possible for further care. SEEK MEDICAL CARE IF: The exam and treatment you received today has been provided on an emergency basis only. This is not a substitute for complete medical or dental care. If your problem worsens or new problems (symptoms) appear, and you are unable to meet with your dentist, call or return to this location. SEEK IMMEDIATE MEDICAL CARE IF:   You have a fever.  You develop redness and swelling of your face, jaw, or neck.  You are unable to open your mouth.  You have severe pain uncontrolled by pain medicine. MAKE SURE YOU:   Understand these instructions.  Will watch your condition.  Will get help right away if you are not doing well or get worse. Document Released: 09/03/2005 Document Revised: 11/26/2011 Document Reviewed: 04/21/2008 [employer] Patient Information 2013 [place], [place].

## 2012-07-02 NOTE — Progress Notes (Signed)
  Subjective:    Patient ID: Sara Manning, female    DOB: Apr 23, 1986, 26 y.o.   MRN: [id number]  HPI  1. Dental pain. C/o pain in right upper and lower gums for past 2 weeks. This is a recurrent complaint. She has not seen dentist yet but states her dentist appt is scheduled in 2 weeks, cannot remember date. Has not had any trauma or recent dental procedures. Wisdom teeth removed years ago. Using OTC pain meds recently.  Review of Systems She denies fever, swelling, trouble swallowing, pain with chewing, bleeding, headache, jaw pain, ear pain.     Objective:   Physical Exam  Vitals reviewed. Constitutional: She is oriented to person, place, and time. She appears well-developed and well-nourished. No distress.  HENT:  Head: Normocephalic and atraumatic.  Right Ear: External ear normal.  Left Ear: External ear normal.  Nose: Nose normal.  Mouth/Throat: Oropharynx is clear and moist. No oropharyngeal exudate.       Gumline looks normal. No tenderness or erythema on exam. No abscess noted. No odor. Dentition appears unremarkable.  Sinuses and jawline nontender.   Neck: Neck supple. No thyromegaly present.  Lymphadenopathy:    She has no cervical adenopathy.  Neurological: She is alert and oriented to person, place, and time. No cranial nerve deficit. Coordination normal.  Skin: No rash noted.  Psychiatric: She has a normal mood and affect.        Assessment & Plan:

## 2012-07-02 NOTE — Assessment & Plan Note (Signed)
No obvious etiology. I see this is a recurrent chief complaint. Advised patient she may use the penicillin in case of occult infection, and motrin for pain as needed. Reinforced need to see dentist, she states she has appt in 2 weeks. Red flags for emergency care are fever, swelling, trouble swallowing, etc.

## 2012-08-08 ENCOUNTER — Telehealth: Payer: Self-pay | Admitting: Family Medicine

## 2012-08-08 NOTE — Telephone Encounter (Signed)
Called emergency line requesting appointment to evaluate an ankle injury. Advised patient if this is urgent or an emergency, she should be evaluated at urgent care or if more serious then go to the ED.

## 2012-08-09 ENCOUNTER — Emergency Department (HOSPITAL_COMMUNITY)
Admission: EM | Admit: 2012-08-09 | Discharge: 2012-08-09 | Disposition: A | Discharge status: Home / Self Care | Payer: Medicaid Other | Attending: Emergency Medicine | Admitting: Emergency Medicine

## 2012-08-09 ENCOUNTER — Encounter (HOSPITAL_COMMUNITY): Payer: Self-pay | Admitting: Emergency Medicine

## 2012-08-09 ENCOUNTER — Emergency Department (HOSPITAL_COMMUNITY): Payer: Medicaid Other

## 2012-08-09 DIAGNOSIS — L02619 Cutaneous abscess of unspecified foot: Secondary | ICD-10-CM | POA: Insufficient documentation

## 2012-08-09 DIAGNOSIS — S93601A Unspecified sprain of right foot, initial encounter: Secondary | ICD-10-CM

## 2012-08-09 DIAGNOSIS — Y939 Activity, unspecified: Secondary | ICD-10-CM | POA: Insufficient documentation

## 2012-08-09 DIAGNOSIS — L03119 Cellulitis of unspecified part of limb: Secondary | ICD-10-CM | POA: Insufficient documentation

## 2012-08-09 DIAGNOSIS — IMO0002 Reserved for concepts with insufficient information to code with codable children: Secondary | ICD-10-CM | POA: Insufficient documentation

## 2012-08-09 DIAGNOSIS — Y929 Unspecified place or not applicable: Secondary | ICD-10-CM | POA: Insufficient documentation

## 2012-08-09 DIAGNOSIS — S93609A Unspecified sprain of unspecified foot, initial encounter: Secondary | ICD-10-CM | POA: Insufficient documentation

## 2012-08-09 DIAGNOSIS — L03115 Cellulitis of right lower limb: Secondary | ICD-10-CM

## 2012-08-09 DIAGNOSIS — Z79899 Other long term (current) drug therapy: Secondary | ICD-10-CM | POA: Insufficient documentation

## 2012-08-09 DIAGNOSIS — F3189 Other bipolar disorder: Secondary | ICD-10-CM | POA: Insufficient documentation

## 2012-08-09 DIAGNOSIS — X500XXA Overexertion from strenuous movement or load, initial encounter: Secondary | ICD-10-CM | POA: Insufficient documentation

## 2012-08-09 DIAGNOSIS — S90811A Abrasion, right foot, initial encounter: Secondary | ICD-10-CM

## 2012-08-09 MED ORDER — CEPHALEXIN 500 MG PO CAPS: 500.0000 mg | ORAL_CAPSULE | Freq: Four times a day (QID) | ORAL | Status: DC

## 2012-08-09 MED ORDER — HYDROCODONE-ACETAMINOPHEN 5-500 MG PO TABS: 1.0000 | ORAL_TABLET | Freq: Four times a day (QID) | ORAL | Status: DC | PRN

## 2012-08-09 NOTE — ED Provider Notes (Deleted)
History     CSN: [id number]  Arrival date & time 08/09/12  1335   First MD Initiated Contact with Patient 08/09/12 1430      Chief Complaint  Patient presents with  . Foot Pain    (Consider location/radiation/quality/duration/timing/severity/associated sxs/prior treatment) HPI  Past Medical History  Diagnosis Date  . Bipolar 2 disorder     History reviewed. No pertinent past surgical history.  Family History  Problem Relation Age of Onset  . Bipolar disorder Mother   . Bipolar disorder Sister   . Schizophrenia Sister     History  Substance Use Topics  . Smoking status: Never Smoker   . Smokeless tobacco: Not on file  . Alcohol Use: No    OB History    Grav Para Term Preterm Abortions TAB SAB Ect Mult Living                  Review of Systems  Allergies  Review of patient's allergies indicates no known allergies.  Home Medications   Current Outpatient Rx  Name  Route  Sig  Dispense  Refill  . DIPHENHYDRAMINE-APAP (SLEEP) 25-500 MG PO TABS   Oral   Take 1 tablet by mouth at bedtime as needed. For sleep and pain         . IBUPROFEN 600 MG PO TABS   Oral   Take 1 tablet (600 mg total) by mouth every 8 (eight) hours as needed for pain.   30 tablet   0   . LAMOTRIGINE 100 MG PO TABS   Oral   Take 1 tablet (100 mg total) by mouth daily.   30 tablet   2     BP 136/81  Pulse 83  Temp 98.2 F (36.8 C) (Oral)  Resp 16  SpO2 98%  Physical Exam  ED Course  Procedures (including critical care time)  Labs Reviewed - No data to display Dg Foot Complete Right  08/09/2012  *RADIOLOGY REPORT*  Clinical Data: Trauma yesterday with pain along lateral anterior aspect.  RIGHT FOOT COMPLETE - 3+ VIEW  Comparison: None.  Findings: No acute fracture or dislocation.  There may be mild lateral soft tissue swelling along the fifth metatarsal shaft.  IMPRESSION: No acute osseous abnormality.   Original Report Authenticated By: Jeronimo Greaves, M.D.      No  diagnosis found.    MDM  No fracture on the xray.  There appears to be slight erythema over the abrasion that extends into the foot.  This may be cellulitis and I will treat as such.  To return prn if worsens.          Geoffery Lyons, MD 08/09/12 [id number]

## 2012-08-09 NOTE — ED Notes (Signed)
Pt c/o right foot pain after twisting foot yesterday; swelling and wound noted

## 2012-08-09 NOTE — ED Provider Notes (Signed)
History   This chart was scribed for Geoffery Lyons, MD, by Frederik Pear, ER scribe. The patient was seen in room TR07C/TR07C and the patient's care was started at 1430.    CSN: [id number]  Arrival date & time 08/09/12  1335   First MD Initiated Contact with Patient 08/09/12 1430      Chief Complaint  Patient presents with  . Foot Pain    (Consider location/radiation/quality/duration/timing/severity/associated sxs/prior treatment) HPI Comments: Sara Manning is a 26 y.o. female who presents to the Emergency Department complaining of right ankle and foot pain with associated swelling and an abrasion after she twisted it and rubbed against a concrete. She was ambulatory after she twisted it.       Past Medical History  Diagnosis Date  . Bipolar 2 disorder     History reviewed. No pertinent past surgical history.  Family History  Problem Relation Age of Onset  . Bipolar disorder Mother   . Bipolar disorder Sister   . Schizophrenia Sister     History  Substance Use Topics  . Smoking status: Never Smoker   . Smokeless tobacco: Not on file  . Alcohol Use: No    OB History    Grav Para Term Preterm Abortions TAB SAB Ect Mult Living                  Review of Systems A complete 10 system review of systems was obtained and all systems are negative except as noted in the HPI and PMH.   Allergies  Review of patient's allergies indicates no known allergies.  Home Medications   Current Outpatient Rx  Name  Route  Sig  Dispense  Refill  . DIPHENHYDRAMINE-APAP (SLEEP) 25-500 MG PO TABS   Oral   Take 1 tablet by mouth at bedtime as needed. For sleep and pain         . IBUPROFEN 600 MG PO TABS   Oral   Take 1 tablet (600 mg total) by mouth every 8 (eight) hours as needed for pain.   30 tablet   0   . LAMOTRIGINE 100 MG PO TABS   Oral   Take 1 tablet (100 mg total) by mouth daily.   30 tablet   2     BP 136/81  Pulse 83  Temp 98.2 F (36.8 C) (Oral)   Resp 16  SpO2 98%  Physical Exam  Nursing note and vitals reviewed. Constitutional: She is oriented to person, place, and time. She appears well-developed and well-nourished. No distress.  HENT:  Head: Normocephalic and atraumatic.  Eyes: EOM are normal. Pupils are equal, round, and reactive to light.  Neck: Normal range of motion. Neck supple. No tracheal deviation present.  Cardiovascular: Normal rate.   Pulmonary/Chest: Effort normal. No respiratory distress.  Abdominal: Soft. She exhibits no distension.  Musculoskeletal: Normal range of motion. She exhibits no edema.       There is ecchymosis and swelling over the lateral aspect of her right foot. She has a small abrasion near the distal 5th metatarsal. She is tender to palpation along the lateral aspect.  Neurological: She is alert and oriented to person, place, and time.  Skin: Skin is warm and dry.  Psychiatric: She has a normal mood and affect. Her behavior is normal.    ED Course  Procedures (including critical care time)  DIAGNOSTIC STUDIES: Oxygen Saturation is 98% on room air, normal by my interpretation.    COORDINATION OF CARE:  13:55- Discussed planned course of treatment with the patient, including a right foot X-ray, who is agreeable at this time.  14:57- Recheck- Discussed writing her a prescription for pain medication and antibiotics. Recommended that she keep it wrapped with an ace bandage.  Labs Reviewed - No data to display Dg Foot Complete Right  08/09/2012  *RADIOLOGY REPORT*  Clinical Data: Trauma yesterday with pain along lateral anterior aspect.  RIGHT FOOT COMPLETE - 3+ VIEW  Comparison: None.  Findings: No acute fracture or dislocation.  There may be mild lateral soft tissue swelling along the fifth metatarsal shaft.  IMPRESSION: No acute osseous abnormality.   Original Report Authenticated By: Jeronimo Greaves, M.D.      No diagnosis found.    MDM  Xrays negative.  Will treat as a sprain.        I personally performed the services described in this documentation, which was scribed in my presence. The recorded information has been reviewed and is accurate.         Geoffery Lyons, MD 08/20/12 2312

## 2012-08-26 ENCOUNTER — Ambulatory Visit: Payer: Medicaid Other

## 2012-09-25 ENCOUNTER — Ambulatory Visit: Payer: Medicaid Other | Admitting: Family Medicine

## 2012-10-02 ENCOUNTER — Encounter: Payer: Self-pay | Admitting: Family Medicine

## 2012-10-02 ENCOUNTER — Ambulatory Visit (INDEPENDENT_AMBULATORY_CARE_PROVIDER_SITE_OTHER): Payer: Medicaid Other | Admitting: Family Medicine

## 2012-10-02 VITALS — BP 113/73 | HR 71 | Temp 98.2°F | Ht 67.0 in | Wt 162.0 lb

## 2012-10-02 DIAGNOSIS — F3132 Bipolar disorder, current episode depressed, moderate: Secondary | ICD-10-CM

## 2012-10-02 DIAGNOSIS — R21 Rash and other nonspecific skin eruption: Secondary | ICD-10-CM

## 2012-10-02 MED ORDER — HYDROCORTISONE 2.5 % EX OINT: TOPICAL_OINTMENT | Freq: Two times a day (BID) | CUTANEOUS | Status: DC

## 2012-10-02 NOTE — Patient Instructions (Signed)
It was nice to meet you today!  I'm not sure what's causing your rash. I sent in an ointment which you can apply twice per day to see if it helps.  Schedule an appointment on the way out, to see Dr. Durene Cal to talk about your mood and follow up on the rash. Try to be seen within 1-2 weeks.  Call our clinic if you have any problems or concerns.  If you have any thoughts of hurting yourself or anyone else, go to the ER to stay safe.  Be well, Dr. Pollie Meyer

## 2012-10-03 NOTE — Progress Notes (Signed)
Patient ID: Sara Manning, female   DOB: Jul 09, 1986, 27 y.o.   MRN: [id number]  CC: Sara Manning is a 27 y.o. female here to discuss a rash under her arms.  HPI:  The rash has been present for several months (she was seen here for it) but last week it started itching badly and burning when she scratched, so she decided to come in today to be seen. She has not started any new medications (even when the rash started). It is located beneath her armpits bilaterally but the left side is worse than the right. No other rashes. No fevers. No involvement in her genital region. She doe snot have a history of eczema but does report a history of being sensitive to scented soaps previously in her life. She was given triamcinolone cream and this helped, but it has been a long time since she used it.  Bipolar disorder: has been off her seroquel & lamictal for 3 months. Denies SI/HI. Does have some moods, she reports.  ROS: See HPI, otherwise reviewed and negative.  PHYSICAL EXAM: BP 113/73  Pulse 71  Temp 98.2 F (36.8 C) (Oral)  Ht 5\' 7"  (1.702 m)  Wt 162 lb (73.483 kg)  BMI 25.37 kg/m2 Gen: NAD, pleasant, cooperative Skin: hyperpigmented irritated rash present just inferior to bilateral axillae. Size L>R. Some slight excoriations in the periphery of the rash but without signs of superinfection.  Psych: normal range of affect, denies SI/HI

## 2012-10-03 NOTE — Assessment & Plan Note (Signed)
Unclear etiology of this rash. Has been present for many months now but acutely worsened last week, with increased itching. Unlikely to be secondary to lamictal since pt has been out for 3 mos. Will rx topical hydrocortisone 2.5% cream since triamcinolone has helped in past. Pt is to f/u with her PCP to discuss bipolar disorder and f/u on rash within 1-2 weeks.

## 2012-10-03 NOTE — Assessment & Plan Note (Signed)
Has been off meds (seroquel & lamictal) for about 3 months. Reports some moodiness. Denies SI/HI. Will have pt return to see PCP within 1-2 weeks to discuss restarting medications. Instructed her to go to the ER if she has any SI/HI.

## 2012-10-29 ENCOUNTER — Encounter: Payer: Medicaid Other | Admitting: Family Medicine

## 2012-10-29 NOTE — Progress Notes (Signed)
This encounter was created in error - please disregard.

## 2013-02-05 ENCOUNTER — Encounter: Payer: Self-pay | Admitting: Family Medicine

## 2013-02-05 ENCOUNTER — Ambulatory Visit (INDEPENDENT_AMBULATORY_CARE_PROVIDER_SITE_OTHER): Payer: Medicaid Other | Admitting: Family Medicine

## 2013-02-05 VITALS — BP 134/95 | HR 99 | Ht 67.0 in | Wt 170.6 lb

## 2013-02-05 DIAGNOSIS — F3132 Bipolar disorder, current episode depressed, moderate: Secondary | ICD-10-CM

## 2013-02-05 MED ORDER — ARIPIPRAZOLE 5 MG PO TABS: 5.0000 mg | ORAL_TABLET | Freq: Every day | ORAL | Status: DC

## 2013-02-05 NOTE — Progress Notes (Signed)
Subjective:   # Bipolar disorder  Review of records show patietn diagnosed in 06/2011 with bipolar. She was started on Seroquel but had drastic weight gain (>25 lbs on high doses) so she was weaned down on dose and Lamictal was added. Patient seen in Mood disorder clinic and recommendation made to consider Abilify if patient did not tolerate seroquel.   Today, Patient admits that she has been on/off of her medicine since October of 2012. First she weaned herself off of seroquel but complains she did not sleep well at all though it helped with her mood in daytime. Then she ran out of lamictal and still had some seroquel refills as of 2 weeks ago so she has been taking 50 mg of seroquel nightly. She thinks she has had increased appetite during this time but has been sleeping better and her mood has improved. She was encouraged to restart taking medication by her boyfriend. Smiles more on medicine. Less foul language on medicine. She complains of Bad temper, gets frustrated easily, poor relationship with mother and sister.  Doesn't enjoy being around people except for kids. All of these are better when on medication. Sometimes affects relationships with kids-gets angry but doesn't her providing for them and has never physically harmed them. Does not use harsh words, tries to get away from them.   PHQ9 shows score of 15 but patient . 3s for appetite (on med), feeling bad about herself, and trouble concentrating. Once again, main symptom is irritability/anger.   ROS--See HPI, denies recent weight gain. denies SI/HI Past Medical History-bipolar, GERD Reviewed problem list.  Medications- reviewed and updated Chief complaint-noted  Objective: BP 134/95  Pulse 99  Ht 5\' 7"  (1.702 m)  Wt 170 lb 9.6 oz (77.384 kg)  BMI 26.71 kg/m2 Gen: NAD, resting comfortably on table  CV: RRR no murmurs rubs or gallops Lungs: CTAB no crackles, wheeze, rhonchi Skin: warm, dry Neuro: grossly normal, moves all  extremities Psych: no pressured speech or grandiose thoughts, not distracted easily. Does seem somewhat irritable.   Assessment/Plan:  DBP elevated but not a regular occurrence so will follow up at next visit.

## 2013-02-05 NOTE — Assessment & Plan Note (Addendum)
Per previous mood disorder recommendations, will start patient on abilify 5 mg and see back in 2 weeks to titrate up to 10mg  hopefully. Did not tolerate seroquel due to weight gain and lamictal does not help sleep. Hopeful she does not gain weight and gets help with abilify for sleep. Discussed side effects.   If patient is well controlled on abilify, will continue treatment, if needs altered regimen, would have low threshold to rerefer to mood disorder clinic.

## 2013-02-05 NOTE — Patient Instructions (Signed)
We are going to try a new medicine Abilify which hopefully will help you sleep better than the lamictal did and hopefully not cause weight gain like the seroquel did.  I want to see you back in 10-14 days to see how you are doing and go up on the medicine if needed.  It is possible you may have some tremors or abnormal movements with this medicine but it isnt as likely if starting low on the dose. Let me know at the next visit.  You also may experience slight anxiety, nausea, dry mouth. Weight gain is possible but less likely than with the seroquel.  If you ever have thoughts of hurting yourself, let us know immediately.   Thanks for seeing me and ill see you in 10-14 days,  Dr. Ria Comment.

## 2013-02-27 ENCOUNTER — Ambulatory Visit: Payer: Self-pay | Admitting: Family Medicine

## 2013-08-05 ENCOUNTER — Ambulatory Visit: Payer: Self-pay | Admitting: Family Medicine

## 2013-09-17 NOTE — L&D Delivery Note (Signed)
Agree with above assessment

## 2013-09-17 NOTE — L&D Delivery Note (Signed)
Delivery Note At 5:50 PM a viable female was delivered via  (Presentation: LOA ).  APGAR: 9,9 ; weight 9 lbs 3.1 oz.   Placenta status: Intact.  Cord:  3 vessel with the following complications: None.  Cord pH: N/A  Anesthesia:  N/A Episiotomy:  N/A  Lacerations:  None  Suture Repair: N/A Est. Blood Loss (mL): 300 cc  Mom to postpartum.  Baby to Couplet care / Skin to Skin.  Gildardo Cranker 05/30/2014, 6:05 PM

## 2013-10-08 ENCOUNTER — Encounter: Payer: Self-pay | Admitting: Family Medicine

## 2013-10-08 ENCOUNTER — Ambulatory Visit (INDEPENDENT_AMBULATORY_CARE_PROVIDER_SITE_OTHER): Payer: Medicaid Other | Admitting: Family Medicine

## 2013-10-08 VITALS — BP 118/82 | HR 81 | Temp 98.4°F | Ht 67.0 in | Wt 166.0 lb

## 2013-10-08 DIAGNOSIS — F3132 Bipolar disorder, current episode depressed, moderate: Secondary | ICD-10-CM

## 2013-10-08 DIAGNOSIS — Z3480 Encounter for supervision of other normal pregnancy, unspecified trimester: Secondary | ICD-10-CM | POA: Insufficient documentation

## 2013-10-08 DIAGNOSIS — R112 Nausea with vomiting, unspecified: Secondary | ICD-10-CM

## 2013-10-08 DIAGNOSIS — Z3201 Encounter for pregnancy test, result positive: Secondary | ICD-10-CM

## 2013-10-08 LAB — POCT URINE PREGNANCY: PREG TEST UR: POSITIVE

## 2013-10-08 MED ORDER — ONDANSETRON 4 MG PO TBDP: 4.0000 mg | ORAL_TABLET | Freq: Three times a day (TID) | ORAL | Status: DC | PRN

## 2013-10-08 MED ORDER — PRENATAL VITAMINS 28-0.8 MG PO TABS: 1.0000 | ORAL_TABLET | Freq: Every day | ORAL | Status: DC

## 2013-10-08 NOTE — Progress Notes (Signed)
  Sara ConchStephen Zackrey Dyar, Sara Manning Phone: [phone]  Subjective:  Chief complaint-noted  Nausea Patient presents with nausea for 2 weeks. Trouble with solids or liquids. Fruits seem to be the only thing she can keep down. Did have some trouble in pregnancy in late 1st trimester with similar nausea/vomiting. No new medications or foods lately. No one has been sick around her. Unknown LMP-does not have periods regularly.  ROS-No diarrhea/constipation. No fevers/chills.   Past Medical History-bipolar on abilify, GERD  Medications- reviewed and updated Current Outpatient Prescriptions  Medication Sig Dispense Refill  . ARIPiprazole (ABILIFY) 5 MG tablet Take 1 tablet (5 mg total) by mouth daily.  30 tablet  0  . ondansetron (ZOFRAN-ODT) 4 MG disintegrating tablet Take 1 tablet (4 mg total) by mouth every 8 (eight) hours as needed for nausea or vomiting.  30 tablet  1  . Prenatal Vit-Fe Fumarate-FA (PRENATAL VITAMINS) 28-0.8 MG TABS Take 1 each by mouth daily.  30 tablet  11   No current facility-administered medications for this visit.    Objective: BP 118/82  Pulse 81  Temp(Src) 98.4 F (36.9 C) (Oral)  Ht 5\' 7"  (1.702 m)  Wt 166 lb (75.297 kg)  BMI 25.99 kg/m2 Gen: NAD, resting comfortably on table HEENT: very mildly dry mucus membranes Skin: good capillary refill Abdomen: unable to palpate fundus, unable to obtain fetal heart tones. soft/nontender/nondistended.  Ext: no edema  Results for orders placed in visit on 10/08/13 (from the past 24 hour(s))  POCT URINE PREGNANCY     Status: None   Collection Time    10/08/13  4:40 PM      Result Value Range   Preg Test, Ur Positive      Assessment/Plan:  Nausea-positive pregnancy test. Will start zofran to be used prn.   Supervision of other normal pregnancy Likely the cause of nausea.  Started PNV. Started zofran for nausea.  Scheduled dating ultrasound. Unknown LMP, suspect this is early as no heart tones noted.  Will let Rinaldo CloudKim Mcbane  know so can be scheduled for initial prenatal ASAP.     Orders Placed This Encounter  Procedures  . US OB Comp Less 14 Wks    Standing Status: Future     Number of Occurrences:      Standing Expiration Date: 12/06/2014    Order Specific Question:  Reason for exam:    Answer:  positive pregnancy test. Does not have regular periods. does not know LMP.    Order Specific Question:  Preferred imaging location?    [facility]  . POCT urine pregnancy    Meds ordered this encounter  Medications  . Prenatal Vit-Fe Fumarate-FA (PRENATAL VITAMINS) 28-0.8 MG TABS    Sig: Take 1 each by mouth daily.    Dispense:  30 tablet    Refill:  11  . ondansetron (ZOFRAN-ODT) 4 MG disintegrating tablet    Sig: Take 1 tablet (4 mg total) by mouth every 8 (eight) hours as needed for nausea or vomiting.    Dispense:  30 tablet    Refill:  1

## 2013-10-08 NOTE — Assessment & Plan Note (Signed)
Likely the cause of nausea.  Started PNV. Started zofran for nausea.  Scheduled dating ultrasound. Unknown LMP, suspect this is early as no heart tones noted.  Will let Rinaldo CloudKim Mcbane know so can be scheduled for initial prenatal ASAP.

## 2013-10-08 NOTE — Patient Instructions (Addendum)
Start a prenatal vitamin immediately.   Take the zofran as needed for nausea.   We will call you with a new OB visit.   We will set up your ultrasound and call you.   Dr. Durene CalHunter

## 2013-10-08 NOTE — Assessment & Plan Note (Signed)
Will need to be discussed at initial prenatal whether to continue. At this point, believe should be continued or seen by psych.

## 2013-10-12 ENCOUNTER — Ambulatory Visit (HOSPITAL_COMMUNITY): Payer: Medicaid Other

## 2013-10-16 ENCOUNTER — Ambulatory Visit (HOSPITAL_COMMUNITY)
Admission: RE | Admit: 2013-10-16 | Discharge: 2013-10-16 | Disposition: A | Discharge status: Home / Self Care | Payer: Medicaid Other | Source: Ambulatory Visit | Attending: Family Medicine | Admitting: Family Medicine

## 2013-10-16 DIAGNOSIS — Z3480 Encounter for supervision of other normal pregnancy, unspecified trimester: Secondary | ICD-10-CM

## 2013-10-16 DIAGNOSIS — Z3689 Encounter for other specified antenatal screening: Secondary | ICD-10-CM | POA: Insufficient documentation

## 2013-10-22 ENCOUNTER — Telehealth: Payer: Self-pay | Admitting: Family Medicine

## 2013-10-22 MED ORDER — ONDANSETRON 4 MG PO TBDP: 4.0000 mg | ORAL_TABLET | Freq: Three times a day (TID) | ORAL | Status: DC | PRN

## 2013-10-22 NOTE — Telephone Encounter (Signed)
Pt is still having morning sickness. She has used all her medicine Can dr call in some more?

## 2013-10-22 NOTE — Telephone Encounter (Signed)
Pt is aware of this. Jazmin Hartsell,CMA  

## 2013-10-22 NOTE — Telephone Encounter (Signed)
Done.  Please inform patient.  Thanks.

## 2013-11-04 ENCOUNTER — Other Ambulatory Visit: Payer: Medicaid Other

## 2013-11-06 ENCOUNTER — Other Ambulatory Visit (INDEPENDENT_AMBULATORY_CARE_PROVIDER_SITE_OTHER): Payer: Medicaid Other

## 2013-11-06 DIAGNOSIS — Z331 Pregnant state, incidental: Secondary | ICD-10-CM

## 2013-11-06 LAB — HIV ANTIBODY (ROUTINE TESTING W REFLEX): HIV: NONREACTIVE

## 2013-11-06 NOTE — Progress Notes (Signed)
PRENATAL LABS DONE TODAY Sara Manning 

## 2013-11-07 LAB — OBSTETRIC PANEL
Antibody Screen: NEGATIVE
Basophils Absolute: 0 10*3/uL (ref 0.0–0.1)
Basophils Relative: 0 % (ref 0–1)
Eosinophils Absolute: 0.1 10*3/uL (ref 0.0–0.7)
Eosinophils Relative: 1 % (ref 0–5)
HCT: 35.8 % — ABNORMAL LOW (ref 36.0–46.0)
Hemoglobin: 12.5 g/dL (ref 12.0–15.0)
Hepatitis B Surface Ag: NEGATIVE
LYMPHS PCT: 26 % (ref 12–46)
Lymphs Abs: 1.8 10*3/uL (ref 0.7–4.0)
MCH: 28.7 pg (ref 26.0–34.0)
MCHC: 34.9 g/dL (ref 30.0–36.0)
MCV: 82.3 fL (ref 78.0–100.0)
Monocytes Absolute: 0.6 10*3/uL (ref 0.1–1.0)
Monocytes Relative: 9 % (ref 3–12)
Neutro Abs: 4.5 10*3/uL (ref 1.7–7.7)
Neutrophils Relative %: 64 % (ref 43–77)
Platelets: 261 10*3/uL (ref 150–400)
RBC: 4.35 MIL/uL (ref 3.87–5.11)
RDW: 14.5 % (ref 11.5–15.5)
RUBELLA: 2.51 {index} — AB (ref <0.90)
Rh Type: POSITIVE
WBC: 7.1 10*3/uL (ref 4.0–10.5)

## 2013-11-07 LAB — SICKLE CELL SCREEN: Sickle Cell Screen: NEGATIVE

## 2013-11-08 LAB — CULTURE, OB URINE

## 2013-11-11 ENCOUNTER — Encounter: Payer: Self-pay | Admitting: Family Medicine

## 2013-11-11 ENCOUNTER — Other Ambulatory Visit (HOSPITAL_COMMUNITY)
Admission: RE | Admit: 2013-11-11 | Discharge: 2013-11-11 | Disposition: A | Discharge status: Home / Self Care | Payer: Medicaid Other | Source: Ambulatory Visit | Attending: Family Medicine | Admitting: Family Medicine

## 2013-11-11 ENCOUNTER — Ambulatory Visit (INDEPENDENT_AMBULATORY_CARE_PROVIDER_SITE_OTHER): Payer: Medicaid Other | Admitting: Family Medicine

## 2013-11-11 VITALS — BP 116/80 | Temp 98.5°F | Wt 166.0 lb

## 2013-11-11 DIAGNOSIS — F3132 Bipolar disorder, current episode depressed, moderate: Secondary | ICD-10-CM

## 2013-11-11 DIAGNOSIS — Z113 Encounter for screening for infections with a predominantly sexual mode of transmission: Secondary | ICD-10-CM | POA: Insufficient documentation

## 2013-11-11 DIAGNOSIS — Z01419 Encounter for gynecological examination (general) (routine) without abnormal findings: Secondary | ICD-10-CM | POA: Insufficient documentation

## 2013-11-11 DIAGNOSIS — Z3480 Encounter for supervision of other normal pregnancy, unspecified trimester: Secondary | ICD-10-CM

## 2013-11-11 MED ORDER — TRIAMCINOLONE ACETONIDE 0.1 % EX CREA: TOPICAL_CREAM | CUTANEOUS | Status: DC

## 2013-11-11 NOTE — Assessment & Plan Note (Addendum)
Has not taken Abilify for the past 6 months.  May need to see psychiatrist at this point, but denies any SI/HI or maniac Sx.  Information given to pt on the [facility] and to call and make an appointment for evaluation.

## 2013-11-11 NOTE — Patient Instructions (Signed)
Pregnancy - First Trimester  During sexual intercourse, millions of sperm go into the vagina. Only 1 sperm will penetrate and fertilize the female egg while it is in the Fallopian tube. One week later, the fertilized egg implants into the wall of the uterus. An embryo begins to develop into a baby. At 6 to 8 weeks, the eyes and face are formed and the heartbeat can be seen on ultrasound. At the end of 12 weeks (first trimester), all the baby's organs are formed. Now that you are pregnant, you will want to do everything you can to have a healthy baby. Two of the most important things are to get good prenatal care and follow your caregiver's instructions. Prenatal care is all the medical care you receive before the baby's birth. It is given to prevent, find, and treat problems during the pregnancy and childbirth.  PRENATAL EXAMS  · During prenatal visits, your weight, blood pressure, and urine are checked. This is done to make sure you are healthy and progressing normally during the pregnancy.  · A pregnant woman should gain 25 to 35 pounds during the pregnancy. However, if you are overweight or underweight, your caregiver will advise you regarding your weight.  · Your caregiver will ask and answer questions for you.  · Blood work, cervical cultures, other necessary tests, and a Pap test are done during your prenatal exams. These tests are done to check on your health and the probable health of your baby. Tests are strongly recommended and done for HIV with your permission. This is the virus that causes AIDS. These tests are done because medicines can be given to help prevent your baby from being born with this infection should you have been infected without knowing it. Blood work is also used to find out your blood type, previous infections, and follow your blood levels (hemoglobin).  · Low hemoglobin (anemia) is common during pregnancy. Iron and vitamins are given to help prevent this. Later in the pregnancy, blood  tests for diabetes will be done along with any other tests if any problems develop.  · You may need other tests to make sure you and the baby are doing well.  CHANGES DURING THE FIRST TRIMESTER   Your body goes through many changes during pregnancy. They vary from person to person. Talk to your caregiver about changes you notice and are concerned about. Changes can include:  · Your menstrual period stops.  · The egg and sperm carry the genes that determine what you look like. Genes from you and your partner are forming a baby. The female genes determine whether the baby is a boy or a girl.  · Your body increases in girth and you may feel bloated.  · Feeling sick to your stomach (nauseous) and throwing up (vomiting). If the vomiting is uncontrollable, call your caregiver.  · Your breasts will begin to enlarge and become tender.  · Your nipples may stick out more and become darker.  · The need to urinate more. Painful urination may mean you have a bladder infection.  · Tiring easily.  · Loss of appetite.  · Cravings for certain kinds of food.  · At first, you may gain or lose a couple of pounds.  · You may have changes in your emotions from day to day (excited to be pregnant or concerned something may go wrong with the pregnancy and baby).  · You may have more vivid and strange dreams.  HOME CARE INSTRUCTIONS   ·   It is very important to avoid all smoking, alcohol and non-prescribed drugs during your pregnancy. These affect the formation and growth of the baby. Avoid chemicals while pregnant to ensure the delivery of a healthy infant.  · Start your prenatal visits by the 12th week of pregnancy. They are usually scheduled monthly at first, then more often in the last 2 months before delivery. Keep your caregiver's appointments. Follow your caregiver's instructions regarding medicine use, blood and lab tests, exercise, and diet.  · During pregnancy, you are providing food for you and your baby. Eat regular, well-balanced  meals. Choose foods such as meat, fish, milk and other low fat dairy products, vegetables, fruits, and whole-grain breads and cereals. Your caregiver will tell you of the ideal weight gain.  · You can help morning sickness by keeping soda crackers at the bedside. Eat a couple before arising in the morning. You may want to use the crackers without salt on them.  · Eating 4 to 5 small meals rather than 3 large meals a day also may help the nausea and vomiting.  · Drinking liquids between meals instead of during meals also seems to help nausea and vomiting.  · A physical sexual relationship may be continued throughout pregnancy if there are no other problems. Problems may be early (premature) leaking of amniotic fluid from the membranes, vaginal bleeding, or belly (abdominal) pain.  · Exercise regularly if there are no restrictions. Check with your caregiver or physical therapist if you are unsure of the safety of some of your exercises. Greater weight gain will occur in the last 2 trimesters of pregnancy. Exercising will help:  · Control your weight.  · Keep you in shape.  · Prepare you for labor and delivery.  · Help you lose your pregnancy weight after you deliver your baby.  · Wear a good support or jogging bra for breast tenderness during pregnancy. This may help if worn during sleep too.  · Ask when prenatal classes are available. Begin classes when they are offered.  · Do not use hot tubs, steam rooms, or saunas.  · Wear your seat belt when driving. This protects you and your baby if you are in an accident.  · Avoid raw meat, uncooked cheese, cat litter boxes, and soil used by cats throughout the pregnancy. These carry germs that can cause birth defects in the baby.  · The first trimester is a good time to visit your dentist for your dental health. Getting your teeth cleaned is okay. Use a softer toothbrush and brush gently during pregnancy.  · Ask for help if you have financial, counseling, or nutritional needs  during pregnancy. Your caregiver will be able to offer counseling for these needs as well as refer you for other special needs.  · Do not take any medicines or herbs unless told by your caregiver.  · Inform your caregiver if there is any mental or physical domestic violence.  · Make a list of emergency phone numbers of family, friends, hospital, and police and fire departments.  · Write down your questions. Take them to your prenatal visit.  · Do not douche.  · Do not cross your legs.  · If you have to stand for long periods of time, rotate you feet or take small steps in a circle.  · You may have more vaginal secretions that may require a sanitary pad. Do not use tampons or scented sanitary pads.  MEDICINES AND DRUG USE IN PREGNANCY  ·   Take prenatal vitamins as directed. The vitamin should contain 1 milligram of folic acid. Keep all vitamins out of reach of children. Only a couple vitamins or tablets containing iron may be fatal to a baby or young child when ingested.  · Avoid use of all medicines, including herbs, over-the-counter medicines, not prescribed or suggested by your caregiver. Only take over-the-counter or prescription medicines for pain, discomfort, or fever as directed by your caregiver. Do not use aspirin, ibuprofen, or naproxen unless directed by your caregiver.  · Let your caregiver also know about herbs you may be using.  · Alcohol is related to a number of birth defects. This includes fetal alcohol syndrome. All alcohol, in any form, should be avoided completely. Smoking will cause low birth rate and premature babies.  · Street or illegal drugs are very harmful to the baby. They are absolutely forbidden. A baby born to an addicted mother will be addicted at birth. The baby will go through the same withdrawal an adult does.  · Let your caregiver know about any medicines that you have to take and for what reason you take them.  SEEK MEDICAL CARE IF:   You have any concerns or worries during your  pregnancy. It is better to call with your questions if you feel they cannot wait, rather than worry about them.  SEEK IMMEDIATE MEDICAL CARE IF:   · An unexplained oral temperature above 102° F (38.9° C) develops, or as your caregiver suggests.  · You have leaking of fluid from the vagina (birth canal). If leaking membranes are suspected, take your temperature and inform your caregiver of this when you call.  · There is vaginal spotting or bleeding. Notify your caregiver of the amount and how many pads are used.  · You develop a bad smelling vaginal discharge with a change in the color.  · You continue to feel sick to your stomach (nauseated) and have no relief from remedies suggested. You vomit blood or coffee ground-like materials.  · You lose more than 2 pounds of weight in 1 week.  · You gain more than 2 pounds of weight in 1 week and you notice swelling of your face, hands, feet, or legs.  · You gain 5 pounds or more in 1 week (even if you do not have swelling of your hands, face, legs, or feet).  · You get exposed to German measles and have never had them.  · You are exposed to fifth disease or chickenpox.  · You develop belly (abdominal) pain. Round ligament discomfort is a common non-cancerous (benign) cause of abdominal pain in pregnancy. Your caregiver still must evaluate this.  · You develop headache, fever, diarrhea, pain with urination, or shortness of breath.  · You fall or are in a car accident or have any kind of trauma.  · There is mental or physical violence in your home.  Document Released: 08/28/2001 Document Revised: 05/28/2012 Document Reviewed: 03/01/2009  [employer]® Patient Information ©2014 [employer].

## 2013-11-11 NOTE — Progress Notes (Signed)
Subjective:    Sara Manning is being seen today for her first obstetrical visit.  This is not a planned pregnancy. She is at 4322w5d gestation. Her obstetrical history is significant for non-compliance. Relationship with FOB: significant other, living together. Patient does intend to breast feed. Pregnancy history fully reviewed.  Menstrual History: OB History   Grav Para Term Preterm Abortions TAB SAB Ect Mult Living   3 2 2  0 0 0 0 0 0 2      Menarche age: 28 y/o   No LMP recorded. Patient is pregnant.    The following portions of the patient's history were reviewed and updated as appropriate: allergies, current medications, past family history, past medical history, past social history, past surgical history and problem list.  Review of Systems Pertinent items are noted in HPI.    Objective:    BP 116/80  Temp(Src) 98.5 F (36.9 C)  Wt 166 lb (75.297 kg) General appearance: alert, cooperative and appears stated age Head: Normocephalic, without obvious abnormality, atraumatic Throat: lips, mucosa, and tongue normal; teeth and gums normal Lungs: clear to auscultation bilaterally Pelvic: external genitalia normal, no adnexal masses or tenderness, no cervical motion tenderness, positive findings: cervical erosion, rectovaginal septum normal, uterus normal size, shape, and consistency and vagina normal without discharge Extremities: extremities normal, atraumatic, no cyanosis or edema Skin: + Maculopapular rash with excoriations along the lateral chest wall B/L    Assessment:    Pregnancy at 13 and 5/7 weeks    Plan:    Initial labs drawn. Prenatal vitamins. Problem list reviewed and updated. AFP3 discussed: declined. Role of ultrasound in pregnancy discussed; fetal survey: declined. Amniocentesis discussed: declined. Follow up in 4 weeks. Contact Dermatitis vs eczema along her bra line.  Kenalog 0.1% cream called in along with recommended Eucerin daily. Bipolar  Depression - Pt denies hx of bipolar depression, however, does have previous S/Sx of this.  PHQ 9 of 18 today, denies SI/HI, information given to her for the [facility] for further evaluation.  Has not been taking Abilify for over 5 months now and do not recommend restarting right now.   Pap Smear and GC/Chlamydia obtained today  Pregnancy home form given out and submitted.  50% of 60 min visit spent on counseling and coordination of care.

## 2013-11-12 LAB — CERVICOVAGINAL ANCILLARY ONLY
CHLAMYDIA, DNA PROBE: NEGATIVE
Neisseria Gonorrhea: NEGATIVE

## 2013-12-03 ENCOUNTER — Inpatient Hospital Stay (HOSPITAL_COMMUNITY)
Admission: AD | Admit: 2013-12-03 | Discharge: 2013-12-03 | Disposition: A | Discharge status: Home / Self Care | Payer: Medicaid Other | Source: Ambulatory Visit | Attending: Obstetrics & Gynecology | Admitting: Obstetrics & Gynecology

## 2013-12-03 ENCOUNTER — Telehealth: Payer: Self-pay | Admitting: *Deleted

## 2013-12-03 ENCOUNTER — Telehealth: Payer: Self-pay | Admitting: Family Medicine

## 2013-12-03 ENCOUNTER — Encounter (HOSPITAL_COMMUNITY): Payer: Self-pay | Admitting: *Deleted

## 2013-12-03 DIAGNOSIS — O99891 Other specified diseases and conditions complicating pregnancy: Secondary | ICD-10-CM | POA: Insufficient documentation

## 2013-12-03 DIAGNOSIS — R350 Frequency of micturition: Secondary | ICD-10-CM | POA: Insufficient documentation

## 2013-12-03 DIAGNOSIS — O9989 Other specified diseases and conditions complicating pregnancy, childbirth and the puerperium: Principal | ICD-10-CM

## 2013-12-03 DIAGNOSIS — N949 Unspecified condition associated with female genital organs and menstrual cycle: Secondary | ICD-10-CM

## 2013-12-03 DIAGNOSIS — R109 Unspecified abdominal pain: Secondary | ICD-10-CM | POA: Insufficient documentation

## 2013-12-03 HISTORY — DX: Headache: R51

## 2013-12-03 LAB — URINALYSIS, ROUTINE W REFLEX MICROSCOPIC
Bilirubin Urine: NEGATIVE
Glucose, UA: NEGATIVE mg/dL
Hgb urine dipstick: NEGATIVE
Ketones, ur: NEGATIVE mg/dL
Leukocytes, UA: NEGATIVE
Nitrite: NEGATIVE
Protein, ur: NEGATIVE mg/dL
SPECIFIC GRAVITY, URINE: 1.02 (ref 1.005–1.030)
Urobilinogen, UA: 0.2 mg/dL (ref 0.0–1.0)
pH: 6 (ref 5.0–8.0)

## 2013-12-03 MED ORDER — ONDANSETRON 4 MG PO TBDP: 4.0000 mg | ORAL_TABLET | Freq: Three times a day (TID) | ORAL | Status: DC | PRN

## 2013-12-03 NOTE — Telephone Encounter (Signed)
~  16 week OB called stating that her abdomen has been hurting about one month, but recently getting worse.  States " I cant even get up to walk without feeling like I am going to fall".  Pt rates 10 on pain scale.  Denies vaginal bleeding.  Pt wanted to wait till appt here on 3/24, advised that since she is having that much pain she needed to go to MAU now.  Pt is agreeable and will get dressed and leave now.  Tarin Johndrow, Maryjo RochesterJessica Dawn

## 2013-12-03 NOTE — Telephone Encounter (Signed)
Pt called and needs a refill on her Zofran. jw °

## 2013-12-03 NOTE — MAU Provider Note (Signed)
History     CSN: [id number]  Arrival date and time: 12/03/13 1610   First Provider Initiated Contact with Patient 12/03/13 1639      Chief Complaint  Patient presents with  . Abdominal Pain   HPI  Pt is a 28 yo G3P2002 at [redacted]w[redacted]d wks IUP here with report of lower abd pain for the past month, has become worse in past week. Denies vaginal bleeding or vaginal discharge. Pain is worse when walking.    Past Medical History  Diagnosis Date  . Bipolar 2 disorder   . [id number](478.2)     Past Surgical History  Procedure Laterality Date  . Wisdom tooth extraction      Family History  Problem Relation Age of Onset  . Bipolar disorder Mother   . Diabetes Mother   . Bipolar disorder Sister   . Schizophrenia Sister     History  Substance Use Topics  . Smoking status: Never Smoker   . Smokeless tobacco: Never Used  . Alcohol Use: No    Allergies: No Known Allergies  Prescriptions prior to admission  Medication Sig Dispense Refill  . ondansetron (ZOFRAN-ODT) 4 MG disintegrating tablet Take 1 tablet (4 mg total) by mouth every 8 (eight) hours as needed for nausea or vomiting.  30 tablet  3  . Prenatal Vit-Fe Fumarate-FA (PRENATAL VITAMINS) 28-0.8 MG TABS Take 1 each by mouth daily.  30 tablet  11  . triamcinolone cream (KENALOG) 0.1 % Apply to affected area two times per day for 7 days and then as needed.  454 g  0    Review of Systems  Constitutional: Negative for fever.  Gastrointestinal: Positive for abdominal pain (lower pelvic pressure with urination).  Genitourinary: Negative for dysuria, hematuria and flank pain.   Physical Exam   Blood pressure 112/69, pulse 80, temperature 98.5 F (36.9 C), temperature source Oral, resp. rate 18, height 5\' 7"  (1.702 m), weight 76.386 kg (168 lb 6.4 oz).  Physical Exam  Constitutional: She is oriented to person, place, and time. She appears well-developed and well-nourished. No distress.  HENT:  Head: Normocephalic.  Neck:  Normal range of motion. Neck supple.  Cardiovascular: Normal rate, regular rhythm and normal heart sounds.   Respiratory: Effort normal and breath sounds normal.  GI: Soft. There is no tenderness.  Genitourinary: No bleeding around the vagina. No vaginal discharge found.  Neurological: She is alert and oriented to person, place, and time.  Skin: Skin is warm and dry.   Dilation: Closed Effacement (%): Thick Cervical Position: Posterior Exam by:: Roney Marion, CNM  MAU Course  Procedures  Results for orders placed during the hospital encounter of 12/03/13 (from the past 24 hour(s))  URINALYSIS, ROUTINE W REFLEX MICROSCOPIC     Status: None   Collection Time    12/03/13  4:15 PM      Result Value Ref Range   Color, Urine YELLOW  YELLOW   APPearance CLEAR  CLEAR   Specific Gravity, Urine 1.020  1.005 - 1.030   pH 6.0  5.0 - 8.0   Glucose, UA NEGATIVE  NEGATIVE mg/dL   Hgb urine dipstick NEGATIVE  NEGATIVE   Bilirubin Urine NEGATIVE  NEGATIVE   Ketones, ur NEGATIVE  NEGATIVE mg/dL   Protein, ur NEGATIVE  NEGATIVE mg/dL   Urobilinogen, UA 0.2  0.0 - 1.0 mg/dL   Nitrite NEGATIVE  NEGATIVE   Leukocytes, UA NEGATIVE  NEGATIVE     Assessment and Plan  28 yo G3P2002 at  828w5d wks IUP Round Ligament Pain  Plan: Discharge to home Pregnancy precautions [facility].   [facility],WALIDAH 12/03/2013, 4:41 PM

## 2013-12-03 NOTE — Telephone Encounter (Signed)
Refilled. Please let patient know. Also, please consider forwarding future refills to her current Ob provider, Dr. Paulina FusiHess.

## 2013-12-03 NOTE — MAU Note (Signed)
Lower abd pain for the past month, has become worse last week.  Denies bleeding or discharge.  Pain is worse when walking.  Urinary frequency.

## 2013-12-03 NOTE — Discharge Instructions (Signed)
ROUND LIGAMENT PAIN ° °Round ligament pain is most common during the second trimester. Women may have a sharp pain in their abdomen or hip area that is either on one side or both. Some women even report pain that extends into the groin area. Round ligament pain is considered a normal part of pregnancy as your body goes through many different changes. ° °What Causes Round Ligament Pain? ° °The round ligament supports the uterus and stretches during pregnancy.  It connects the front portion of the uterus to the groin. These ligaments contract and relax like muscles, but much more slowly. Any movement (including going from a sitting position to standing position quickly, laughing, or coughing) that stretches these ligaments, by making the ligaments contract quickly, can cause a woman to experience pain.  Round ligament pain should only last for a few seconds. ° °What Can Be Done To Alleviate Round Ligament Pain? ° °Rest is one of the best ways to help with this kind of pain. Changing positions slowly allows the ligaments to stretch more gradually and can help alleviate any pain. If you know that you are going to sneeze, cough, or laugh you can bend and flex your hips, which can reduce the pull on the ligaments. ° °If you are having consistent round ligament pain your health care provider may recommend daily stretching exercises.  The most common exercise is done by placing your hands and knees on the floor, lowering your head to the floor, and keeping your bottom in the air. ° °When Should I Call My Health Care Provider? ° °If the pain persists after resting, or it is accompanied by severe pain, you would want to notify your health care provider. If the pain lasts for more than a few minutes you should contact your health care provider immediately. You would also want to notify your health care provider if the pain is accompanied by any bleeding, cramping, fever, chills, nausea, vomiting, or change in vaginal  discharge. °

## 2013-12-08 ENCOUNTER — Ambulatory Visit (INDEPENDENT_AMBULATORY_CARE_PROVIDER_SITE_OTHER): Payer: Medicaid Other | Admitting: Family Medicine

## 2013-12-08 VITALS — BP 111/68 | Temp 98.2°F | Wt 167.0 lb

## 2013-12-08 DIAGNOSIS — O219 Vomiting of pregnancy, unspecified: Secondary | ICD-10-CM

## 2013-12-08 DIAGNOSIS — Z3480 Encounter for supervision of other normal pregnancy, unspecified trimester: Secondary | ICD-10-CM

## 2013-12-08 DIAGNOSIS — F3132 Bipolar disorder, current episode depressed, moderate: Secondary | ICD-10-CM

## 2013-12-08 MED ORDER — DOXYLAMINE-PYRIDOXINE 10-10 MG PO TBEC: 1.0000 | DELAYED_RELEASE_TABLET | Freq: Three times a day (TID) | ORAL | Status: DC | PRN

## 2013-12-08 NOTE — Patient Instructions (Signed)
Nice seeing you today Sara Manning, the number for the [facility] is [phone].  You may get your anatomy ultrasound in the next 2-3 weeks at your convenience.   Thanks, Dr. Torre Pikus  

## 2013-12-08 NOTE — Assessment & Plan Note (Signed)
Has not called [facility] to schedule appointment, information given again to pt.

## 2013-12-08 NOTE — Progress Notes (Signed)
Subjective:    Sara Manning is being seen today for ob f/u. Having some nausea and occasional vomiting but otherwise denies any vaginal bleeding, abdominal pain, loss of fluid, HA, blurred vision, worsening LE edema.   Menstrual History: OB History   Grav Para Term Preterm Abortions TAB SAB Ect Mult Living   3 2 2  0 0 0 0 0 0 2      The following portions of the patient's history were reviewed and updated as appropriate: allergies, current medications, past family history, past medical history, past social history, past surgical history and problem list.  Review of Systems Pertinent items are noted in HPI.    Objective:    BP 111/68  Temp(Src) 98.2 F (36.8 C)  Wt 167 lb (75.751 kg) General appearance: alert, cooperative and appears stated age Cardio: RRR Lung: CTA Extremities: no LE edema B/L   Assessment:    Pregnancy at 16.3 wks   Plan:    Follow up in 4 weeks. Bipolar Depression - Given # for [facility] again Anatomy scan between 17-20 weeks ordered Doxylamine/B6 given for nausea

## 2013-12-16 ENCOUNTER — Encounter: Payer: Self-pay | Admitting: *Deleted

## 2013-12-22 ENCOUNTER — Telehealth: Payer: Self-pay | Admitting: Family Medicine

## 2013-12-22 ENCOUNTER — Ambulatory Visit (HOSPITAL_COMMUNITY)
Admission: RE | Admit: 2013-12-22 | Discharge: 2013-12-22 | Disposition: A | Discharge status: Home / Self Care | Payer: Medicaid Other | Source: Ambulatory Visit | Attending: Family Medicine | Admitting: Family Medicine

## 2013-12-22 DIAGNOSIS — O219 Vomiting of pregnancy, unspecified: Secondary | ICD-10-CM

## 2013-12-22 DIAGNOSIS — Z3689 Encounter for other specified antenatal screening: Secondary | ICD-10-CM | POA: Insufficient documentation

## 2013-12-22 DIAGNOSIS — Z3480 Encounter for supervision of other normal pregnancy, unspecified trimester: Secondary | ICD-10-CM

## 2013-12-22 MED ORDER — DOXYLAMINE-PYRIDOXINE 10-10 MG PO TBEC: 1.0000 | DELAYED_RELEASE_TABLET | Freq: Three times a day (TID) | ORAL | Status: DC | PRN

## 2013-12-22 NOTE — Telephone Encounter (Signed)
Refilled her Doxylamine, please let pt know.  Thanks, Sara FirstBryan R. Paulina FusiHess, DO [facility] 12/22/2013, 5:08 PM

## 2013-12-22 NOTE — Telephone Encounter (Signed)
Patient informed. 

## 2013-12-22 NOTE — Telephone Encounter (Signed)
Will Forward to MD

## 2013-12-22 NOTE — Telephone Encounter (Signed)
Pt is being seen by Dr. Paulina FusiHess for her pregnancy and would like some nausea medication called in to her pharmacy. jw

## 2014-01-08 ENCOUNTER — Ambulatory Visit (INDEPENDENT_AMBULATORY_CARE_PROVIDER_SITE_OTHER): Payer: Medicaid Other | Admitting: Family Medicine

## 2014-01-08 VITALS — BP 114/66 | Temp 98.2°F | Wt 171.0 lb

## 2014-01-08 DIAGNOSIS — O4410 Placenta previa with hemorrhage, unspecified trimester: Secondary | ICD-10-CM

## 2014-01-08 DIAGNOSIS — O4400 Placenta previa specified as without hemorrhage, unspecified trimester: Secondary | ICD-10-CM

## 2014-01-08 MED ORDER — PROMETHAZINE HCL 25 MG PO TABS: 25.0000 mg | ORAL_TABLET | Freq: Three times a day (TID) | ORAL | Status: DC | PRN

## 2014-01-08 NOTE — Patient Instructions (Signed)
Nice seeing you today Laureen AbrahamsShakia, the number for the [facility] is [phone].  You may get your anatomy ultrasound in the next 2-3 weeks at your convenience.   Thanks, Dr. Paulina FusiHess

## 2014-01-08 NOTE — Progress Notes (Signed)
Subjective:    Sara Manning is being seen today for ob f/u. Having some nausea and occasional vomiting but otherwise denies any vaginal bleeding, abdominal pain, loss of fluid, HA, blurred vision, worsening LE edema.   Menstrual History: OB History   Grav Para Term Preterm Abortions TAB SAB Ect Mult Living   3 2 2  0 0 0 0 0 0 2      The following portions of the patient's history were reviewed and updated as appropriate: allergies, current medications, past family history, past medical history, past social history, past surgical history and problem list.  Review of Systems Pertinent items are noted in HPI.    Objective:    BP 114/66  Temp(Src) 98.2 F (36.8 C)  Wt 171 lb (77.565 kg) General appearance: alert, cooperative and appears stated age Cardio: RRR Lung: CTA Extremities: no LE edema B/L   Assessment:    Pregnancy at 20.6  wks   Plan:    Follow up in 4 weeks. Bipolar Depression - Given # for [facility] again Reordered Anatomy scan for concern for previa and full detail of anatomy of fetus per report from 3/24 Switch to phenergan from Doxylamine for better control of nausea.   Sara FirstBryan R. Paulina FusiHess, DO [facility] 01/08/2014, 11:43 AM

## 2014-01-21 ENCOUNTER — Ambulatory Visit (HOSPITAL_COMMUNITY): Payer: Medicaid Other

## 2014-01-26 ENCOUNTER — Ambulatory Visit (HOSPITAL_COMMUNITY)
Admission: RE | Admit: 2014-01-26 | Discharge: 2014-01-26 | Disposition: A | Discharge status: Home / Self Care | Payer: Medicaid Other | Source: Ambulatory Visit | Attending: Family Medicine | Admitting: Family Medicine

## 2014-01-26 ENCOUNTER — Telehealth: Payer: Self-pay | Admitting: Family Medicine

## 2014-01-26 DIAGNOSIS — O4410 Placenta previa with hemorrhage, unspecified trimester: Secondary | ICD-10-CM | POA: Insufficient documentation

## 2014-01-26 DIAGNOSIS — Z3689 Encounter for other specified antenatal screening: Secondary | ICD-10-CM | POA: Insufficient documentation

## 2014-01-26 DIAGNOSIS — O4400 Placenta previa specified as without hemorrhage, unspecified trimester: Secondary | ICD-10-CM

## 2014-01-26 NOTE — Telephone Encounter (Signed)
Pt called and would like a letter stating that she can not push or pull anything at work. jw

## 2014-01-26 NOTE — Telephone Encounter (Signed)
Not my patient; I have never seen her.  [facility]

## 2014-02-09 ENCOUNTER — Encounter: Payer: Medicaid Other | Admitting: Family Medicine

## 2014-02-25 ENCOUNTER — Inpatient Hospital Stay (HOSPITAL_COMMUNITY)
Admission: AD | Admit: 2014-02-25 | Discharge: 2014-02-25 | Disposition: A | Discharge status: Home / Self Care | Payer: Medicaid Other | Source: Ambulatory Visit | Attending: Obstetrics & Gynecology | Admitting: Obstetrics & Gynecology

## 2014-02-25 ENCOUNTER — Encounter (HOSPITAL_COMMUNITY): Payer: Self-pay | Admitting: General Practice

## 2014-02-25 DIAGNOSIS — M25559 Pain in unspecified hip: Secondary | ICD-10-CM | POA: Insufficient documentation

## 2014-02-25 DIAGNOSIS — N949 Unspecified condition associated with female genital organs and menstrual cycle: Secondary | ICD-10-CM | POA: Insufficient documentation

## 2014-02-25 DIAGNOSIS — M79606 Pain in leg, unspecified: Secondary | ICD-10-CM

## 2014-02-25 DIAGNOSIS — R1084 Generalized abdominal pain: Secondary | ICD-10-CM

## 2014-02-25 DIAGNOSIS — IMO0001 Reserved for inherently not codable concepts without codable children: Secondary | ICD-10-CM | POA: Insufficient documentation

## 2014-02-25 DIAGNOSIS — M79609 Pain in unspecified limb: Secondary | ICD-10-CM

## 2014-02-25 DIAGNOSIS — O9989 Other specified diseases and conditions complicating pregnancy, childbirth and the puerperium: Principal | ICD-10-CM

## 2014-02-25 DIAGNOSIS — O99891 Other specified diseases and conditions complicating pregnancy: Secondary | ICD-10-CM

## 2014-02-25 LAB — URINALYSIS, ROUTINE W REFLEX MICROSCOPIC
Bilirubin Urine: NEGATIVE
GLUCOSE, UA: NEGATIVE mg/dL
Hgb urine dipstick: NEGATIVE
KETONES UR: NEGATIVE mg/dL
LEUKOCYTES UA: NEGATIVE
NITRITE: NEGATIVE
Protein, ur: NEGATIVE mg/dL
Specific Gravity, Urine: 1.025 (ref 1.005–1.030)
Urobilinogen, UA: 1 mg/dL (ref 0.0–1.0)
pH: 6 (ref 5.0–8.0)

## 2014-02-25 MED ORDER — BENZOCAINE-RESORCINOL 5-2 % VA CREA: TOPICAL_CREAM | Freq: Every day | VAGINAL | Status: DC

## 2014-02-25 MED ORDER — COMFORT FIT MATERNITY SUPP LG MISC: Status: DC

## 2014-02-25 MED ORDER — PROMETHAZINE HCL 25 MG PO TABS: 25.0000 mg | ORAL_TABLET | Freq: Three times a day (TID) | ORAL | Status: DC | PRN

## 2014-02-25 NOTE — MAU Provider Note (Signed)
First Provider Initiated Contact with Patient 02/25/14 2009      Chief Complaint:  Pelvic Pain   Sara Manning is  28 y.o. G3P2002 at [redacted]w[redacted]d presents complaining of pain in her right hip that extends to her right thigh.  She has had the pain for 2 weeks, it is worse with movement, and is better with rest.  Denies ctx, LOF, VB. Also c/o occ vaginal itching, no change in d/c, odor, or irritation.  Requests refill on phenergan for occasional nausea  Obstetrical/Gynecological History: OB History   Grav Para Term Preterm Abortions TAB SAB Ect Mult Living   3 2 2  0 0 0 0 0 0 2     Past Medical History: Past Medical History  Diagnosis Date  . Bipolar 2 disorder   . TMAUQJFH(545.6)     Past Surgical History: Past Surgical History  Procedure Laterality Date  . Wisdom tooth extraction      Family History: Family History  Problem Relation Age of Onset  . Bipolar disorder Mother   . Diabetes Mother   . Bipolar disorder Sister   . Schizophrenia Sister     Social History: History  Substance Use Topics  . Smoking status: Never Smoker   . Smokeless tobacco: Never Used  . Alcohol Use: No    Allergies: No Known Allergies  Meds:  Prescriptions prior to admission  Medication Sig Dispense Refill  . acetaminophen (TYLENOL) 500 MG tablet Take 1,000 mg by mouth every 6 (six) hours as needed for headache.      . Prenatal Vit-Fe Fumarate-FA (PRENATAL VITAMINS) 28-0.8 MG TABS Take 1 each by mouth daily.  30 tablet  11  . triamcinolone cream (KENALOG) 0.1 % Apply to affected area two times per day for 7 days and then as needed.  454 g  0  . [DISCONTINUED] promethazine (PHENERGAN) 25 MG tablet Take 1 tablet (25 mg total) by mouth every 8 (eight) hours as needed for nausea or vomiting.  45 tablet  0   Review of Systems   Constitutional: Negative for fever and chills Eyes: Negative for visual disturbances Respiratory: Negative for shortness of breath, dyspnea Cardiovascular: Negative  for chest pain or palpitations  Gastrointestinal: Negative for vomiting, diarrhea and constipation Genitourinary: Negative for dysuria and urgency Musculoskeletal: Negative for back pain,,Neurological: Negative for dizziness and headaches    Physical Exam  Blood pressure 126/73, pulse 91, temperature 98 F (36.7 C), temperature source Oral, resp. rate 18, height 5\' 6"  (1.676 m), weight 81.647 kg (180 lb). GENERAL: Well-developed, well-nourished female in no acute distress.  LUNGS: Clear to auscultation bilaterally.  HEART: Regular rate and rhythm. ABDOMEN: Soft, nontender, nondistended, gravid.  EXTREMITIES: Nontender, no edema, 2+ distal pulses. DTR's 2+ PELVIC EXAM: External genitalia normal appearing SSE:  Normal appearing discharge without odor; wet prep negative;  vaginal walls pink CERVICAL EXAM: Dilatation 0cm   Effacement 0%   Station -3   Presentation: cephalic FHT:  Baseline rate 145 bpm   Variability moderate  Accelerations present, reassuring for GA   Decelerations none Contractions: Every 0 mins   Labs: Results for orders placed during the hospital encounter of 02/25/14 (from the past 24 hour(s))  URINALYSIS, ROUTINE W REFLEX MICROSCOPIC   Collection Time    02/25/14  7:05 PM      Result Value Ref Range   Color, Urine YELLOW  YELLOW   APPearance CLEAR  CLEAR   Specific Gravity, Urine 1.025  1.005 - 1.030   pH 6.0  5.0 - 8.0   Glucose, UA NEGATIVE  NEGATIVE mg/dL   Hgb urine dipstick NEGATIVE  NEGATIVE   Bilirubin Urine NEGATIVE  NEGATIVE   Ketones, ur NEGATIVE  NEGATIVE mg/dL   Protein, ur NEGATIVE  NEGATIVE mg/dL   Urobilinogen, UA 1.0  0.0 - 1.0 mg/dL   Nitrite NEGATIVE  NEGATIVE   Leukocytes, UA NEGATIVE  NEGATIVE   Imaging Studies:  No results found.  Assessment: Sara Manning is  28 y.o. G3P2002 at 5742w5d presents with musculoskeletal pain .No evidence of vaginal infection  Plan: Meds ordered this encounter  Medications  . promethazine (PHENERGAN)  25 MG tablet    Sig: Take 1 tablet (25 mg total) by mouth every 8 (eight) hours as needed for nausea or vomiting.    Dispense:  30 tablet    Refill:  1    Order Specific Question:  Supervising Provider    Answer:  LEGGETT, KELLY H [[id number]]  . benzocaine-resorcinol (VAGISIL) 5-2 % vaginal cream    Sig: Place vaginally at bedtime.    Dispense:  60 g    Refill:  0    Order Specific Question:  Supervising Provider    Answer:  LEGGETT, KELLY H [[id number]]  . Elastic Bandages & Supports (COMFORT FIT MATERNITY SUPP LG) MISC    Sig: Wear during work d/t severe musculoskeletal pain associated with pregnancy    Dispense:  1 each    Refill:  0    Order Specific Question:  Supervising Provider    Answer:  LEGGETT, KELLY H [[id number]]  . Prenatal Vit-Fe Fumarate-FA (PRENATAL MULTIVITAMIN) TABS tablet    Sig: Take 1 tablet by mouth daily at 12 noon.  . promethazine (PHENERGAN) 25 MG tablet    Sig: Take 25 mg by mouth every 8 (eight) hours as needed for nausea or vomiting.     Sara Manning,Sara Manning 6/11/20158:30 PM

## 2014-02-25 NOTE — Discharge Instructions (Signed)
Musculoskeletal Pain °Musculoskeletal pain is muscle and boney aches and pains. These pains can occur in any part of the body. Your caregiver may treat you without knowing the cause of the pain. They may treat you if blood or urine tests, X-rays, and other tests were normal.  °CAUSES °There is often not a definite cause or reason for these pains. These pains may be caused by a type of germ (virus). The discomfort may also come from overuse. Overuse includes working out too hard when your body is not fit. Boney aches also come from weather changes. Bone is sensitive to atmospheric pressure changes. °HOME CARE INSTRUCTIONS  °· Ask when your test results will be ready. Make sure you get your test results. °· Only take over-the-counter or prescription medicines for pain, discomfort, or fever as directed by your caregiver. If you were given medications for your condition, do not drive, operate machinery or power tools, or sign legal documents for 24 hours. Do not drink alcohol. Do not take sleeping pills or other medications that may interfere with treatment. °· Continue all activities unless the activities cause more pain. When the pain lessens, slowly resume normal activities. Gradually increase the intensity and duration of the activities or exercise. °· During periods of severe pain, bed rest may be helpful. Lay or sit in any position that is comfortable. °· Putting ice on the injured area. °· Put ice in a bag. °· Place a towel between your skin and the bag. °· Leave the ice on for 15 to 20 minutes, 3 to 4 times a day. °· Follow up with your caregiver for continued problems and no reason can be found for the pain. If the pain becomes worse or does not go away, it may be necessary to repeat tests or do additional testing. Your caregiver may need to look further for a possible cause. °SEEK IMMEDIATE MEDICAL CARE IF: °· You have pain that is getting worse and is not relieved by medications. °· You develop chest pain  that is associated with shortness or breath, sweating, feeling sick to your stomach (nauseous), or throw up (vomit). °· Your pain becomes localized to the abdomen. °· You develop any new symptoms that seem different or that concern you. °MAKE SURE YOU:  °· Understand these instructions. °· Will watch your condition. °· Will get help right away if you are not doing well or get worse. °Document Released: 09/03/2005 Document Revised: 11/26/2011 Document Reviewed: 05/08/2013 °ExitCare® Patient Information ©2014 [employer]. ° °

## 2014-02-25 NOTE — MAU Note (Signed)
Pt reports she has been having pain in her right lower pelvic/groin area. Hurts worse when she is standing or laying was unable to get OOB this morning.

## 2014-03-08 ENCOUNTER — Ambulatory Visit (INDEPENDENT_AMBULATORY_CARE_PROVIDER_SITE_OTHER): Payer: Medicaid Other | Admitting: Family Medicine

## 2014-03-08 VITALS — BP 108/70 | HR 89 | Temp 98.8°F | Wt 180.0 lb

## 2014-03-08 DIAGNOSIS — Z3480 Encounter for supervision of other normal pregnancy, unspecified trimester: Secondary | ICD-10-CM

## 2014-03-08 DIAGNOSIS — Z3483 Encounter for supervision of other normal pregnancy, third trimester: Secondary | ICD-10-CM

## 2014-03-08 LAB — CBC WITH DIFFERENTIAL/PLATELET
BASOS ABS: 0 10*3/uL (ref 0.0–0.1)
BASOS PCT: 0 % (ref 0–1)
Eosinophils Absolute: 0.1 10*3/uL (ref 0.0–0.7)
Eosinophils Relative: 1 % (ref 0–5)
HCT: 32.6 % — ABNORMAL LOW (ref 36.0–46.0)
Hemoglobin: 11.1 g/dL — ABNORMAL LOW (ref 12.0–15.0)
LYMPHS PCT: 23 % (ref 12–46)
Lymphs Abs: 1.3 10*3/uL (ref 0.7–4.0)
MCH: 28 pg (ref 26.0–34.0)
MCHC: 34 g/dL (ref 30.0–36.0)
MCV: 82.3 fL (ref 78.0–100.0)
Monocytes Absolute: 0.4 10*3/uL (ref 0.1–1.0)
Monocytes Relative: 8 % (ref 3–12)
NEUTROS ABS: 3.7 10*3/uL (ref 1.7–7.7)
NEUTROS PCT: 68 % (ref 43–77)
Platelets: 230 10*3/uL (ref 150–400)
RBC: 3.96 MIL/uL (ref 3.87–5.11)
RDW: 13.6 % (ref 11.5–15.5)
WBC: 5.5 10*3/uL (ref 4.0–10.5)

## 2014-03-08 NOTE — MAU Provider Note (Signed)
Attestation of Attending Supervision of Advanced Practitioner (CNM/NP): Evaluation and management procedures were performed by the Advanced Practitioner under my supervision and collaboration. I have reviewed the Advanced Practitioner's note and chart, and I agree with the management and plan.  Tevita Gomer H. 4:57 PM

## 2014-03-08 NOTE — Progress Notes (Signed)
Subjective:    Sara Manning is being seen today for ob f/u.Denies nausea, vomiting, any vaginal bleeding, abdominal pain, loss of fluid, HA, blurred vision, worsening LE edema.   Menstrual History: OB History   Grav Para Term Preterm Abortions TAB SAB Ect Mult Living   3 2 2  0 0 0 0 0 0 2      The following portions of the patient's history were reviewed and updated as appropriate: allergies, current medications, past family history, past medical history, past social history, past surgical history and problem list.  Review of Systems Pertinent items are noted in HPI.    Objective:    BP 108/70  Pulse 89  Temp(Src) 98.8 F (37.1 C)  Wt 180 lb (81.647 kg) General appearance: alert, cooperative and appears stated age Cardio: RRR Lung: CTA Extremities: no LE edema B/L   Assessment:    Pregnancy at 29.2 wks   Plan:    Follow up in 2 weeks. Bipolar Depression - Has not followed up with the [facility], emphasized again to establish care here during pregnancy.  Number given two previous visits.  Repeat Anatomy Scan done and no previa seen on 5/12 US.   labwork ordered today including CBC, HIV, RPR, and needs to come back in for 1 hr GTT (ordered today for future)  F/U in 2 weeks.  Twana FirstBryan R. Paulina FusiHess, DO [facility] 03/08/2014, 4:33 PM

## 2014-03-08 NOTE — Patient Instructions (Signed)
Third Trimester of Pregnancy The third trimester is from week 29 through week 42, months 7 through 9. The third trimester is a time when the fetus is growing rapidly. At the end of the ninth month, the fetus is about 20 inches in length and weighs 6-10 pounds.  BODY CHANGES Your body goes through many changes during pregnancy. The changes vary from woman to woman.   Your weight will continue to increase. You can expect to gain 25-35 pounds (11-16 kg) by the end of the pregnancy.  You may begin to get stretch marks on your hips, abdomen, and breasts.  You may urinate more often because the fetus is moving lower into your pelvis and pressing on your bladder.  You may develop or continue to have heartburn as a result of your pregnancy.  You may develop constipation because certain hormones are causing the muscles that push waste through your intestines to slow down.  You may develop hemorrhoids or swollen, bulging veins (varicose veins).  You may have pelvic pain because of the weight gain and pregnancy hormones relaxing your joints between the bones in your pelvis. Backaches may result from overexertion of the muscles supporting your posture.  You may have changes in your hair. These can include thickening of your hair, rapid growth, and changes in texture. Some women also have hair loss during or after pregnancy, or hair that feels dry or thin. Your hair will most likely return to normal after your baby is born.  Your breasts will continue to grow and be tender. A yellow discharge may leak from your breasts called colostrum.  Your belly button may stick out.  You may feel short of breath because of your expanding uterus.  You may notice the fetus "dropping," or moving lower in your abdomen.  You may have a bloody mucus discharge. This usually occurs a few days to a week before labor begins.  Your cervix becomes thin and soft (effaced) near your due date. WHAT TO EXPECT AT YOUR PRENATAL  EXAMS  You will have prenatal exams every 2 weeks until week 36. Then, you will have weekly prenatal exams. During a routine prenatal visit:  You will be weighed to make sure you and the fetus are growing normally.  Your blood pressure is taken.  Your abdomen will be measured to track your baby's growth.  The fetal heartbeat will be listened to.  Any test results from the previous visit will be discussed.  You may have a cervical check near your due date to see if you have effaced. At around 36 weeks, your caregiver will check your cervix. At the same time, your caregiver will also perform a test on the secretions of the vaginal tissue. This test is to determine if a type of bacteria, Group B streptococcus, is present. Your caregiver will explain this further. Your caregiver may ask you:  What your birth plan is.  How you are feeling.  If you are feeling the baby move.  If you have had any abnormal symptoms, such as leaking fluid, bleeding, severe headaches, or abdominal cramping.  If you have any questions. Other tests or screenings that may be performed during your third trimester include:  Blood tests that check for low iron levels (anemia).  Fetal testing to check the health, activity level, and growth of the fetus. Testing is done if you have certain medical conditions or if there are problems during the pregnancy. FALSE LABOR You may feel small, irregular contractions that   eventually go away. These are called Braxton Hicks contractions, or false labor. Contractions may last for hours, days, or even weeks before true labor sets in. If contractions come at regular intervals, intensify, or become painful, it is best to be seen by your caregiver.  SIGNS OF LABOR   Menstrual-like cramps.  Contractions that are 5 minutes apart or less.  Contractions that start on the top of the uterus and spread down to the lower abdomen and back.  A sense of increased pelvic pressure or back  pain.  A watery or bloody mucus discharge that comes from the vagina. If you have any of these signs before the 37th week of pregnancy, call your caregiver right away. You need to go to the hospital to get checked immediately. HOME CARE INSTRUCTIONS   Avoid all smoking, herbs, alcohol, and unprescribed drugs. These chemicals affect the formation and growth of the baby.  Follow your caregiver's instructions regarding medicine use. There are medicines that are either safe or unsafe to take during pregnancy.  Exercise only as directed by your caregiver. Experiencing uterine cramps is a good sign to stop exercising.  Continue to eat regular, healthy meals.  Wear a good support bra for breast tenderness.  Do not use hot tubs, steam rooms, or saunas.  Wear your seat belt at all times when driving.  Avoid raw meat, uncooked cheese, cat litter boxes, and soil used by cats. These carry germs that can cause birth defects in the baby.  Take your prenatal vitamins.  Try taking a stool softener (if your caregiver approves) if you develop constipation. Eat more high-fiber foods, such as fresh vegetables or fruit and whole grains. Drink plenty of fluids to keep your urine clear or pale yellow.  Take warm sitz baths to soothe any pain or discomfort caused by hemorrhoids. Use hemorrhoid cream if your caregiver approves.  If you develop varicose veins, wear support hose. Elevate your feet for 15 minutes, 3-4 times a day. Limit salt in your diet.  Avoid heavy lifting, wear low heal shoes, and practice good posture.  Rest a lot with your legs elevated if you have leg cramps or low back pain.  Visit your dentist if you have not gone during your pregnancy. Use a soft toothbrush to brush your teeth and be gentle when you floss.  A sexual relationship may be continued unless your caregiver directs you otherwise.  Do not travel far distances unless it is absolutely necessary and only with the approval  of your caregiver.  Take prenatal classes to understand, practice, and ask questions about the labor and delivery.  Make a trial run to the hospital.  Pack your hospital bag.  Prepare the baby's nursery.  Continue to go to all your prenatal visits as directed by your caregiver. SEEK MEDICAL CARE IF:  You are unsure if you are in labor or if your water has broken.  You have dizziness.  You have mild pelvic cramps, pelvic pressure, or nagging pain in your abdominal area.  You have persistent nausea, vomiting, or diarrhea.  You have a bad smelling vaginal discharge.  You have pain with urination. SEEK IMMEDIATE MEDICAL CARE IF:   You have a fever.  You are leaking fluid from your vagina.  You have spotting or bleeding from your vagina.  You have severe abdominal cramping or pain.  You have rapid weight loss or gain.  You have shortness of breath with chest pain.  You notice sudden or extreme swelling   of your face, hands, ankles, feet, or legs.  You have not felt your baby move in over an hour.  You have severe headaches that do not go away with medicine.  You have vision changes. Document Released: 08/28/2001 Document Revised: 09/08/2013 Document Reviewed: 11/04/2012 ExitCare Patient Information 2015 [employer]. This information is not intended to replace advice given to you by your health care Dan Scearce. Make sure you discuss any questions you have with your health care Clare Fennimore.  

## 2014-03-09 ENCOUNTER — Telehealth: Payer: Self-pay | Admitting: Family Medicine

## 2014-03-09 ENCOUNTER — Encounter: Payer: Self-pay | Admitting: *Deleted

## 2014-03-09 LAB — RPR

## 2014-03-09 LAB — HIV ANTIBODY (ROUTINE TESTING W REFLEX): HIV: NONREACTIVE

## 2014-03-09 NOTE — Telephone Encounter (Signed)
Pt was seen yesterday by Dr. Paulina FusiHess and forgot her note for work. Can we put this up front for pickup and also call her when ready for pickup. jw

## 2014-03-09 NOTE — Telephone Encounter (Signed)
Wrote letter out and informed patient that it is ready for pick up.

## 2014-03-10 ENCOUNTER — Inpatient Hospital Stay (HOSPITAL_COMMUNITY)
Admission: AD | Admit: 2014-03-10 | Discharge: 2014-03-10 | Disposition: A | Discharge status: Home / Self Care | Payer: Medicaid Other | Source: Ambulatory Visit | Attending: Obstetrics & Gynecology | Admitting: Obstetrics & Gynecology

## 2014-03-10 ENCOUNTER — Encounter (HOSPITAL_COMMUNITY): Payer: Self-pay

## 2014-03-10 DIAGNOSIS — F319 Bipolar disorder, unspecified: Secondary | ICD-10-CM | POA: Diagnosis not present

## 2014-03-10 DIAGNOSIS — Y92410 Unspecified street and highway as the place of occurrence of the external cause: Secondary | ICD-10-CM | POA: Diagnosis not present

## 2014-03-10 DIAGNOSIS — Z043 Encounter for examination and observation following other accident: Secondary | ICD-10-CM

## 2014-03-10 DIAGNOSIS — O368190 Decreased fetal movements, unspecified trimester, not applicable or unspecified: Secondary | ICD-10-CM | POA: Insufficient documentation

## 2014-03-10 DIAGNOSIS — R109 Unspecified abdominal pain: Secondary | ICD-10-CM | POA: Diagnosis not present

## 2014-03-10 DIAGNOSIS — O99340 Other mental disorders complicating pregnancy, unspecified trimester: Secondary | ICD-10-CM | POA: Diagnosis not present

## 2014-03-10 DIAGNOSIS — Z041 Encounter for examination and observation following transport accident: Secondary | ICD-10-CM

## 2014-03-10 LAB — URINALYSIS, ROUTINE W REFLEX MICROSCOPIC
Glucose, UA: NEGATIVE mg/dL
Hgb urine dipstick: NEGATIVE
Ketones, ur: 15 mg/dL — AB
Leukocytes, UA: NEGATIVE
Nitrite: NEGATIVE
PROTEIN: NEGATIVE mg/dL
Specific Gravity, Urine: >1.03 — ABNORMAL HIGH (ref 1.005–1.030)
UROBILINOGEN UA: 4 mg/dL — AB (ref 0.0–1.0)
pH: 6 (ref 5.0–8.0)

## 2014-03-10 MED ORDER — ONDANSETRON 8 MG PO TBDP
8.0000 mg | ORAL_TABLET | Freq: Once | ORAL | Status: AC
  Administered 2014-03-10: 8 mg via ORAL
  Filled 2014-03-10: qty 1

## 2014-03-10 MED ORDER — ONDANSETRON 4 MG PO TBDP: 4.0000 mg | ORAL_TABLET | Freq: Three times a day (TID) | ORAL | Status: DC | PRN
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
## 2014-03-10 NOTE — Discharge Instructions (Signed)

## 2014-03-18 ENCOUNTER — Encounter (HOSPITAL_COMMUNITY): Payer: Self-pay | Admitting: *Deleted

## 2014-03-18 ENCOUNTER — Inpatient Hospital Stay (HOSPITAL_COMMUNITY)
Admission: AD | Admit: 2014-03-18 | Discharge: 2014-03-18 | Disposition: A | Discharge status: Home / Self Care | Payer: Medicaid Other | Source: Ambulatory Visit | Attending: Obstetrics & Gynecology | Admitting: Obstetrics & Gynecology

## 2014-03-18 DIAGNOSIS — M79609 Pain in unspecified limb: Secondary | ICD-10-CM

## 2014-03-18 DIAGNOSIS — K219 Gastro-esophageal reflux disease without esophagitis: Secondary | ICD-10-CM | POA: Insufficient documentation

## 2014-03-18 DIAGNOSIS — O9989 Other specified diseases and conditions complicating pregnancy, childbirth and the puerperium: Principal | ICD-10-CM

## 2014-03-18 DIAGNOSIS — O99891 Other specified diseases and conditions complicating pregnancy: Secondary | ICD-10-CM

## 2014-03-18 DIAGNOSIS — N949 Unspecified condition associated with female genital organs and menstrual cycle: Secondary | ICD-10-CM | POA: Insufficient documentation

## 2014-03-18 DIAGNOSIS — R21 Rash and other nonspecific skin eruption: Secondary | ICD-10-CM

## 2014-03-18 DIAGNOSIS — O4700 False labor before 37 completed weeks of gestation, unspecified trimester: Secondary | ICD-10-CM | POA: Insufficient documentation

## 2014-03-18 LAB — URINALYSIS, ROUTINE W REFLEX MICROSCOPIC
Bilirubin Urine: NEGATIVE
Glucose, UA: NEGATIVE mg/dL
HGB URINE DIPSTICK: NEGATIVE
KETONES UR: NEGATIVE mg/dL
Leukocytes, UA: NEGATIVE
Nitrite: NEGATIVE
PROTEIN: NEGATIVE mg/dL
SPECIFIC GRAVITY, URINE: 1.02 (ref 1.005–1.030)
UROBILINOGEN UA: 4 mg/dL — AB (ref 0.0–1.0)
pH: 7 (ref 5.0–8.0)

## 2014-03-18 MED ORDER — IBUPROFEN 600 MG PO TABS: 600.0000 mg | ORAL_TABLET | Freq: Four times a day (QID) | ORAL | Status: DC | PRN

## 2014-03-18 MED ORDER — PANTOPRAZOLE SODIUM 40 MG PO TBEC: 40.0000 mg | DELAYED_RELEASE_TABLET | Freq: Every day | ORAL | Status: DC

## 2014-03-18 NOTE — MAU Note (Signed)
Pt C/O pelvic pain that radiates down her R leg, also R groin pain for the past week.  Also C/O rash under breasts.  Pt denies bleeding, LOF, or discharge.

## 2014-03-18 NOTE — Discharge Instructions (Signed)

## 2014-03-18 NOTE — MAU Note (Signed)
Rash noted under breasts, appears dark red, not raised, no skin breakdown noted.

## 2014-03-18 NOTE — MAU Provider Note (Signed)
  History     CSN: [id number]  Arrival date and time: 03/18/14 1216   First Provider Initiated Contact with Patient 03/18/14 1539      Chief Complaint  Patient presents with  . Pelvic Pain  . Leg Pain  . Rash   HPI G3P2002 [id number] PNC at [facility] with Dr. Paulina FusiHess, due to f/u in 4 days. Has right leg pain from groin to anterior thigh and foot. Occasional contractions.     Past Medical History  Diagnosis Date  . Bipolar 2 disorder   . WUJWJXBJ(478.2Headache(784.0)     Past Surgical History  Procedure Laterality Date  . Wisdom tooth extraction    . No past surgeries      Family History  Problem Relation Age of Onset  . Bipolar disorder Mother   . Diabetes Mother   . Bipolar disorder Sister   . Schizophrenia Sister     History  Substance Use Topics  . Smoking status: Never Smoker   . Smokeless tobacco: Never Used  . Alcohol Use: No    Allergies: No Known Allergies  Prescriptions prior to admission  Medication Sig Dispense Refill  . benzocaine-resorcinol (VAGISIL) 5-2 % vaginal cream Place vaginally at bedtime.  60 g  0  . ondansetron (ZOFRAN ODT) 4 MG disintegrating tablet Take 1 tablet (4 mg total) by mouth every 8 (eight) hours as needed for nausea or vomiting.  20 tablet  0  . Prenatal Vit-Fe Fumarate-FA (PRENATAL MULTIVITAMIN) TABS tablet Take 1 tablet by mouth daily at 12 noon.      . triamcinolone cream (KENALOG) 0.1 % Apply 1 application topically 2 (two) times daily. Apply twice daily for 7 days then as needed for itching.      Clinical research associate. Elastic Bandages & Supports (COMFORT FIT MATERNITY SUPP LG) MISC Wear during work d/t severe musculoskeletal pain associated with pregnancy  1 each  0    Review of Systems  Constitutional: Negative.   Gastrointestinal: Positive for heartburn, nausea and vomiting.  Genitourinary: Negative for dysuria and frequency.       No bleeding or discharge  Musculoskeletal:       Right leg pain  Skin: Positive for rash.       Under both breasts    Physical Exam   Blood pressure 111/74, pulse 101, temperature 98 F (36.7 C), temperature source Oral, resp. rate 16.  Physical Exam  Constitutional: She is oriented to person, place, and time. She appears well-developed. No distress.  Neck: Normal range of motion.  Respiratory: Effort normal. No respiratory distress.  GI: Soft.  Gravid c/w dates  Genitourinary: Vagina normal. No vaginal discharge found.  Cervix long and closed  Musculoskeletal:       Right hip: She exhibits no tenderness.       Legs: Neurological: She is oriented to person, place, and time.  Skin: Skin is warm and dry.  Psychiatric: She has a normal mood and affect. Her behavior is normal.   Red rash under both breasts MAU Course  Procedures  MDM moderate  Assessment and Plan  MS right leg pain, no deformity, may be due to lateral femoral cutaneous nerve compression. Heartburn and nausea. Rash under breasts bilaterally. Triamcinolone cream BID for rash . Ibuprofen prn MS pain, may need chiro referral. Protonix for reflux. F/U with [facility] next week  ARNOLD,JAMES 03/18/2014, 4:06 PM

## 2014-03-22 ENCOUNTER — Encounter: Payer: Medicaid Other | Admitting: Family Medicine

## 2014-03-22 ENCOUNTER — Encounter: Payer: Self-pay | Admitting: *Deleted

## 2014-03-22 ENCOUNTER — Encounter: Payer: Self-pay | Admitting: Family Medicine

## 2014-03-22 NOTE — Progress Notes (Signed)
Returned pt's call, would like note stating she can't work.  Recommended bringing form from work before any type of leave was given.  She states this was not possible but again reiterated to pt that she needs to be seen prior to any type of leave from work can be written.    Sara FirstBryan R. Paulina FusiHess, DO [facility] 03/22/2014, 4:54 PM

## 2014-03-30 ENCOUNTER — Ambulatory Visit (INDEPENDENT_AMBULATORY_CARE_PROVIDER_SITE_OTHER): Payer: Medicaid Other | Admitting: Family Medicine

## 2014-03-30 VITALS — BP 104/65 | HR 105 | Temp 97.8°F | Wt 181.6 lb

## 2014-03-30 DIAGNOSIS — Z23 Encounter for immunization: Secondary | ICD-10-CM

## 2014-03-30 DIAGNOSIS — Z331 Pregnant state, incidental: Secondary | ICD-10-CM

## 2014-03-30 DIAGNOSIS — Z3493 Encounter for supervision of normal pregnancy, unspecified, third trimester: Secondary | ICD-10-CM
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
## 2014-03-30 NOTE — Addendum Note (Signed)
Addended by: Farrell OursEVANS, Aran Menning K on: 03/30/2014 03:15 PM   Modules accepted: Orders

## 2014-03-30 NOTE — Patient Instructions (Signed)
Third Trimester of Pregnancy The third trimester is from week 29 through week 42, months 7 through 9. The third trimester is a time when the fetus is growing rapidly. At the end of the ninth month, the fetus is about 20 inches in length and weighs 6-10 pounds.  BODY CHANGES Your body goes through many changes during pregnancy. The changes vary from woman to woman.   Your weight will continue to increase. You can expect to gain 25-35 pounds (11-16 kg) by the end of the pregnancy.  You may begin to get stretch marks on your hips, abdomen, and breasts.  You may urinate more often because the fetus is moving lower into your pelvis and pressing on your bladder.  You may develop or continue to have heartburn as a result of your pregnancy.  You may develop constipation because certain hormones are causing the muscles that push waste through your intestines to slow down.  You may develop hemorrhoids or swollen, bulging veins (varicose veins).  You may have pelvic pain because of the weight gain and pregnancy hormones relaxing your joints between the bones in your pelvis. Backaches may result from overexertion of the muscles supporting your posture.  You may have changes in your hair. These can include thickening of your hair, rapid growth, and changes in texture. Some women also have hair loss during or after pregnancy, or hair that feels dry or thin. Your hair will most likely return to normal after your baby is born.  Your breasts will continue to grow and be tender. A yellow discharge may leak from your breasts called colostrum.  Your belly button may stick out.  You may feel short of breath because of your expanding uterus.  You may notice the fetus "dropping," or moving lower in your abdomen.  You may have a bloody mucus discharge. This usually occurs a few days to a week before labor begins.  Your cervix becomes thin and soft (effaced) near your due date. WHAT TO EXPECT AT YOUR PRENATAL  EXAMS  You will have prenatal exams every 2 weeks until week 36. Then, you will have weekly prenatal exams. During a routine prenatal visit:  You will be weighed to make sure you and the fetus are growing normally.  Your blood pressure is taken.  Your abdomen will be measured to track your baby's growth.  The fetal heartbeat will be listened to.  Any test results from the previous visit will be discussed.  You may have a cervical check near your due date to see if you have effaced. At around 36 weeks, your caregiver will check your cervix. At the same time, your caregiver will also perform a test on the secretions of the vaginal tissue. This test is to determine if a type of bacteria, Group B streptococcus, is present. Your caregiver will explain this further. Your caregiver may ask you:  What your birth plan is.  How you are feeling.  If you are feeling the baby move.  If you have had any abnormal symptoms, such as leaking fluid, bleeding, severe headaches, or abdominal cramping.  If you have any questions. Other tests or screenings that may be performed during your third trimester include:  Blood tests that check for low iron levels (anemia).  Fetal testing to check the health, activity level, and growth of the fetus. Testing is done if you have certain medical conditions or if there are problems during the pregnancy. FALSE LABOR You may feel small, irregular contractions that   eventually go away. These are called Braxton Hicks contractions, or false labor. Contractions may last for hours, days, or even weeks before true labor sets in. If contractions come at regular intervals, intensify, or become painful, it is best to be seen by your caregiver.  SIGNS OF LABOR   Menstrual-like cramps.  Contractions that are 5 minutes apart or less.  Contractions that start on the top of the uterus and spread down to the lower abdomen and back.  A sense of increased pelvic pressure or back  pain.  A watery or bloody mucus discharge that comes from the vagina. If you have any of these signs before the 37th week of pregnancy, call your caregiver right away. You need to go to the hospital to get checked immediately. HOME CARE INSTRUCTIONS   Avoid all smoking, herbs, alcohol, and unprescribed drugs. These chemicals affect the formation and growth of the baby.  Follow your caregiver's instructions regarding medicine use. There are medicines that are either safe or unsafe to take during pregnancy.  Exercise only as directed by your caregiver. Experiencing uterine cramps is a good sign to stop exercising.  Continue to eat regular, healthy meals.  Wear a good support bra for breast tenderness.  Do not use hot tubs, steam rooms, or saunas.  Wear your seat belt at all times when driving.  Avoid raw meat, uncooked cheese, cat litter boxes, and soil used by cats. These carry germs that can cause birth defects in the baby.  Take your prenatal vitamins.  Try taking a stool softener (if your caregiver approves) if you develop constipation. Eat more high-fiber foods, such as fresh vegetables or fruit and whole grains. Drink plenty of fluids to keep your urine clear or pale yellow.  Take warm sitz baths to soothe any pain or discomfort caused by hemorrhoids. Use hemorrhoid cream if your caregiver approves.  If you develop varicose veins, wear support hose. Elevate your feet for 15 minutes, 3-4 times a day. Limit salt in your diet.  Avoid heavy lifting, wear low heal shoes, and practice good posture.  Rest a lot with your legs elevated if you have leg cramps or low back pain.  Visit your dentist if you have not gone during your pregnancy. Use a soft toothbrush to brush your teeth and be gentle when you floss.  A sexual relationship may be continued unless your caregiver directs you otherwise.  Do not travel far distances unless it is absolutely necessary and only with the approval  of your caregiver.  Take prenatal classes to understand, practice, and ask questions about the labor and delivery.  Make a trial run to the hospital.  Pack your hospital bag.  Prepare the baby's nursery.  Continue to go to all your prenatal visits as directed by your caregiver. SEEK MEDICAL CARE IF:  You are unsure if you are in labor or if your water has broken.  You have dizziness.  You have mild pelvic cramps, pelvic pressure, or nagging pain in your abdominal area.  You have persistent nausea, vomiting, or diarrhea.  You have a bad smelling vaginal discharge.  You have pain with urination. SEEK IMMEDIATE MEDICAL CARE IF:   You have a fever.  You are leaking fluid from your vagina.  You have spotting or bleeding from your vagina.  You have severe abdominal cramping or pain.  You have rapid weight loss or gain.  You have shortness of breath with chest pain.  You notice sudden or extreme swelling   of your face, hands, ankles, feet, or legs.  You have not felt your baby move in over an hour.  You have severe headaches that do not go away with medicine.  You have vision changes. Document Released: 08/28/2001 Document Revised: 09/08/2013 Document Reviewed: 11/04/2012 [facility] Patient Information 2015 [place] [place]. This information is not intended to replace advice given to you by your health care provider. Make sure you discuss any questions you have with your health care provider.  Abdominal Pain During Pregnancy Belly (abdominal) pain is common during pregnancy. Most of the time, it is not a serious problem. Other times, it can be a sign that something is wrong with the pregnancy. Always tell your doctor if you have belly pain. HOME CARE Monitor your belly pain for any changes. The following actions may help you feel better:  Do not have sex (intercourse) or put anything in your vagina until you feel better.  Rest until your pain stops.  Drink clear fluids  if you feel sick to your stomach (nauseous). Do not eat solid food until you feel better.  Only take medicine as told by your doctor.  Keep all doctor visits as told. GET HELP RIGHT AWAY IF:   You are bleeding, leaking fluid, or pieces of tissue come out of your vagina.  You have more pain or cramping.  You keep throwing up (vomiting).  You have pain when you pee (urinate) or have blood in your pee.  You have a fever.  You do not feel your baby moving as much.  You feel very weak or feel like passing out.  You have trouble breathing, with or without belly pain.  You have a very bad headache and belly pain.  You have fluid leaking from your vagina and belly pain.  You keep having watery poop (diarrhea).  Your belly pain does not go away after resting, or the pain gets worse. MAKE SURE YOU:   Understand these instructions.  Will watch your condition.  Will get help right away if you are not doing well or get worse. Document Released: 08/22/2009 Document Revised: 05/06/2013 Document Reviewed: 04/02/2013 [facility] Patient Information 2015 [place], [place]. This information is not intended to replace advice given to you by your health care provider. Make sure you discuss any questions you have with your health care provider.   - you will need to schedule your 1 hour Glucola test. - Tetanus shot given today - Your pain is likely ligament/muscleskeletal  Pain. If your pain becomes worse please be seen in the MAU to  Make certain the baby is ok.  - You can take tylenol for your pain

## 2014-04-09 ENCOUNTER — Ambulatory Visit: Payer: Medicaid Other | Admitting: Family Medicine

## 2014-04-09 ENCOUNTER — Telehealth: Payer: Self-pay | Admitting: Family Medicine

## 2014-04-09 NOTE — Telephone Encounter (Signed)
Pt is pregnant, will send to prenatal MD. Sara Manning, Sara RochesterJessica Manning

## 2014-04-09 NOTE — Telephone Encounter (Signed)
Has a bad toothache. What can she take for the pain? She overslept and missed her appt

## 2014-04-09 NOTE — Telephone Encounter (Signed)
Tylenol for now, if she needs something further, she can probably go to urgent care and get some tramadol.  Thanks, Twana FirstBryan R. Paulina FusiHess, DO [facility] 04/09/2014, 1:58 PM

## 2014-04-09 NOTE — Telephone Encounter (Signed)
Pt informed. Fleeger, Jessica Dawn  

## 2014-04-11 ENCOUNTER — Emergency Department (INDEPENDENT_AMBULATORY_CARE_PROVIDER_SITE_OTHER)
Admission: EM | Admit: 2014-04-11 | Discharge: 2014-04-11 | Disposition: A | Discharge status: Home / Self Care | Payer: Medicaid Other | Source: Home / Self Care | Attending: Family Medicine | Admitting: Family Medicine

## 2014-04-11 ENCOUNTER — Encounter (HOSPITAL_COMMUNITY): Payer: Self-pay | Admitting: Emergency Medicine

## 2014-04-11 DIAGNOSIS — K089 Disorder of teeth and supporting structures, unspecified: Secondary | ICD-10-CM

## 2014-04-11 DIAGNOSIS — Z331 Pregnant state, incidental: Secondary | ICD-10-CM

## 2014-04-11 DIAGNOSIS — Z3483 Encounter for supervision of other normal pregnancy, third trimester: Secondary | ICD-10-CM

## 2014-04-11 DIAGNOSIS — K0889 Other specified disorders of teeth and supporting structures: Secondary | ICD-10-CM

## 2014-04-11 DIAGNOSIS — K141 Geographic tongue: Secondary | ICD-10-CM

## 2014-04-11 MED ORDER — HYDROCODONE-ACETAMINOPHEN 5-325 MG PO TABS: 1.0000 | ORAL_TABLET | Freq: Four times a day (QID) | ORAL | Status: DC | PRN

## 2014-04-11 MED ORDER — NITROGLYCERIN 0.4 MG SL SUBL
SUBLINGUAL_TABLET | SUBLINGUAL | Status: AC
  Filled 2014-04-11: qty 1

## 2014-04-11 MED ORDER — NYSTATIN 100000 UNIT/ML MT SUSP: 500000.0000 [IU] | Freq: Four times a day (QID) | OROMUCOSAL | Status: DC

## 2014-04-11 MED ORDER — AMOXICILLIN 500 MG PO CAPS: 500.0000 mg | ORAL_CAPSULE | Freq: Three times a day (TID) | ORAL | Status: DC

## 2014-04-11 NOTE — ED Provider Notes (Signed)
Laureen AbrahamsShakia Cyndia SkeetersM Schiro is a 28 y.o. female who presents to Urgent Care today for tooth pain. Patient has a several day history of right tooth pain. She did do her dentist who will not perform extraction until after her current pregnancy. She is approximately [redacted] weeks pregnant. She's tried Tylenol and ibuprofen which have not helped. She denies any fevers or chills nausea vomiting or diarrhea. Patient additionally notes a tongue lesion that is mildly irritating. She describes a burning sensation and multiple circular lesions on her tongue. This is been present throughout the duration of her pregnancy. She's concerned about thrush.   Past Medical History  Diagnosis Date  . Bipolar 2 disorder   . NFAOZHYQ(657.8Headache(784.0)    History  Substance Use Topics  . Smoking status: Never Smoker   . Smokeless tobacco: Never Used  . Alcohol Use: No   ROS as above Medications: No current facility-administered medications for this encounter.   Current Outpatient Prescriptions  Medication Sig Dispense Refill  . ondansetron (ZOFRAN ODT) 4 MG disintegrating tablet Take 1 tablet (4 mg total) by mouth every 8 (eight) hours as needed for nausea or vomiting.  20 tablet  0  . Prenatal Vit-Fe Fumarate-FA (PRENATAL MULTIVITAMIN) TABS tablet Take 1 tablet by mouth daily at 12 noon.      Marland Kitchen. amoxicillin (AMOXIL) 500 MG capsule Take 1 capsule (500 mg total) by mouth 3 (three) times daily.  30 capsule  0  . Elastic Bandages & Supports (COMFORT FIT MATERNITY SUPP LG) MISC Wear during work d/t severe musculoskeletal pain associated with pregnancy  1 each  0  . HYDROcodone-acetaminophen (NORCO/VICODIN) 5-325 MG per tablet Take 1 tablet by mouth every 6 (six) hours as needed.  15 tablet  0  . ibuprofen (ADVIL,MOTRIN) 600 MG tablet Take 1 tablet (600 mg total) by mouth every 6 (six) hours as needed.  30 tablet  0  . nystatin (MYCOSTATIN) 100000 UNIT/ML suspension Take 5 mLs (500,000 Units total) by mouth 4 (four) times daily.  60 mL  0  .  pantoprazole (PROTONIX) 40 MG tablet Take 1 tablet (40 mg total) by mouth daily.  30 tablet  2  . triamcinolone cream (KENALOG) 0.1 % Apply 1 application topically 2 (two) times daily. Apply twice daily for 7 days then as needed for itching.        Exam:  BP 108/72  Pulse 100  Temp(Src) 98.3 F (36.8 C) (Oral)  SpO2 100% Gen: Well NAD HEENT: EOMI,  MMM, geographic-appearing tongue. Teeth are normal appearing with mom, erythema upper right. Tender to touch. Lungs: Normal work of breathing. CTABL Heart: RRR no MRG Abd: NABS, Soft. Nondistended, Nontender Exts: Brisk capillary refill, warm and well perfused.   No results found for this or any previous visit (from the past 24 hour(s)). No results found.  Assessment and Plan: 28 y.o. female with  1) dental pain secondary to infection. Treat with amoxicillin and Norco.  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 Trial of nystatin. Followup with primary care Dr. 3) pregnancy: Followup with PCP  Discussed warning signs or symptoms. Please see discharge instructions. Patient expresses understanding.   This note was created using Conservation officer, historic buildingsDragon voice recognition software. Any transcription errors are unintended.    Rodolph BongEvan S Raylen Tangonan, MD 04/11/14 1901

## 2014-04-11 NOTE — Discharge Instructions (Signed)
Thank you for coming in today. 1) Dental Pain: Take the amoxicillin three times daily.  Use norco for severe pain.  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  We will try treating for thrush with nystatin swish and swallow.  See your doctor soon.   Dental Pain A tooth ache may be caused by cavities (tooth decay). Cavities expose the nerve of the tooth to air and hot or cold temperatures. It may come from an infection or abscess (also called a boil or furuncle) around your tooth. It is also often caused by dental caries (tooth decay). This causes the pain you are having. DIAGNOSIS  Your caregiver can diagnose this problem by exam. TREATMENT   If caused by an infection, it may be treated with medications which kill germs (antibiotics) and pain medications as prescribed by your caregiver. Take medications as directed.  Only take over-the-counter or prescription medicines for pain, discomfort, or fever as directed by your caregiver.  Whether the tooth ache today is caused by infection or dental disease, you should see your dentist as soon as possible for further care. SEEK MEDICAL CARE IF: The exam and treatment you received today has been provided on an emergency basis only. This is not a substitute for complete medical or dental care. If your problem worsens or new problems (symptoms) appear, and you are unable to meet with your dentist, call or return to this location. SEEK IMMEDIATE MEDICAL CARE IF:   You have a fever.  You develop redness and swelling of your face, jaw, or neck.  You are unable to open your mouth.  You have severe pain uncontrolled by pain medicine. MAKE SURE YOU:   Understand these instructions.  Will watch your condition.  Will get help right away if you are not doing well or get worse. Document Released: 09/03/2005 Document Revised: 11/26/2011 Document Reviewed: 04/21/2008 [facility] Patient Information 2015 [place], [place]. This information is  not intended to replace advice given to you by your health care provider. Make sure you discuss any questions you have with your health care provider.

## 2014-04-11 NOTE — ED Notes (Signed)
Pregnant woman comes in with c/o upper right tooth ache radiating to mouth since last Monday. States Ibuprofen and Tylenol not working for pain Pt is due Sept. 5,2015 Denies abscess or drainage States dentist will not see her until after 31 weeks preg C/o headache with sx's

## 2014-04-16 ENCOUNTER — Encounter: Payer: Medicaid Other | Admitting: Family Medicine

## 2014-04-27 ENCOUNTER — Other Ambulatory Visit (HOSPITAL_COMMUNITY)
Admission: RE | Admit: 2014-04-27 | Discharge: 2014-04-27 | Disposition: A | Discharge status: Home / Self Care | Payer: Medicaid Other | Source: Ambulatory Visit | Attending: Family Medicine | Admitting: Family Medicine

## 2014-04-27 ENCOUNTER — Ambulatory Visit (INDEPENDENT_AMBULATORY_CARE_PROVIDER_SITE_OTHER): Payer: Medicaid Other | Admitting: Family Medicine

## 2014-04-27 VITALS — BP 101/70 | HR 93 | Temp 98.2°F | Wt 187.0 lb

## 2014-04-27 DIAGNOSIS — Z113 Encounter for screening for infections with a predominantly sexual mode of transmission: Secondary | ICD-10-CM | POA: Insufficient documentation

## 2014-04-27 DIAGNOSIS — Z3480 Encounter for supervision of other normal pregnancy, unspecified trimester: Secondary | ICD-10-CM

## 2014-04-27 DIAGNOSIS — Z3483 Encounter for supervision of other normal pregnancy, third trimester: Secondary | ICD-10-CM

## 2014-04-27 LAB — OB RESULTS CONSOLE GC/CHLAMYDIA
Chlamydia: NEGATIVE
GC PROBE AMP, GENITAL: NEGATIVE

## 2014-04-27 NOTE — Progress Notes (Signed)
Subjective:    Sara Manning is being seen today for ob f/u.Denies nausea, vomiting, any vaginal bleeding, abdominal pain, loss of fluid, HA, blurred vision, worsening LE edema.   Menstrual History: OB History   Grav Para Term Preterm Abortions TAB SAB Ect Mult Living   3 2 2  0 0 0 0 0 0 2      The following portions of the patient's history were reviewed and updated as appropriate: allergies, current medications, past family history, past medical history, past social history, past surgical history and problem list.  Review of Systems Pertinent items are noted in HPI.    Objective:    BP 101/70  Pulse 93  Temp(Src) 98.2 F (36.8 C)  Wt 187 lb (84.823 kg) General appearance: alert, cooperative and appears stated age Cardio: RRR Lung: CTA Extremities: no LE edema B/L   Assessment:    Pregnancy at 36.3   Plan:    Follow up in 1 weeks. Bipolar Depression - Has not followed up with the [facility], emphasized again to establish care here during pregnancy.  Number given in first and second trimester Repeat Anatomy Scan done and no previa seen on 5/12 US.   Emphasized pt needs to come back in for 1 hr GTT in AM(ordered for future when she was 28 wks) GBS/GC/Chlamydia obtained today   Alara Daniel R. Paulina FusiHess, DO [facility] 04/27/2014, 4:26 PM

## 2014-04-27 NOTE — Patient Instructions (Signed)
Third Trimester of Pregnancy The third trimester is from week 29 through week 42, months 7 through 9. The third trimester is a time when the fetus is growing rapidly. At the end of the ninth month, the fetus is about 20 inches in length and weighs 6-10 pounds.  BODY CHANGES Your body goes through many changes during pregnancy. The changes vary from woman to woman.   Your weight will continue to increase. You can expect to gain 25-35 pounds (11-16 kg) by the end of the pregnancy.  You may begin to get stretch marks on your hips, abdomen, and breasts.  You may urinate more often because the fetus is moving lower into your pelvis and pressing on your bladder.  You may develop or continue to have heartburn as a result of your pregnancy.  You may develop constipation because certain hormones are causing the muscles that push waste through your intestines to slow down.  You may develop hemorrhoids or swollen, bulging veins (varicose veins).  You may have pelvic pain because of the weight gain and pregnancy hormones relaxing your joints between the bones in your pelvis. Backaches may result from overexertion of the muscles supporting your posture.  You may have changes in your hair. These can include thickening of your hair, rapid growth, and changes in texture. Some women also have hair loss during or after pregnancy, or hair that feels dry or thin. Your hair will most likely return to normal after your baby is born.  Your breasts will continue to grow and be tender. A yellow discharge may leak from your breasts called colostrum.  Your belly button may stick out.  You may feel short of breath because of your expanding uterus.  You may notice the fetus "dropping," or moving lower in your abdomen.  You may have a bloody mucus discharge. This usually occurs a few days to a week before labor begins.  Your cervix becomes thin and soft (effaced) near your due date. WHAT TO EXPECT AT YOUR PRENATAL  EXAMS  You will have prenatal exams every 2 weeks until week 36. Then, you will have weekly prenatal exams. During a routine prenatal visit:  You will be weighed to make sure you and the fetus are growing normally.  Your blood pressure is taken.  Your abdomen will be measured to track your baby's growth.  The fetal heartbeat will be listened to.  Any test results from the previous visit will be discussed.  You may have a cervical check near your due date to see if you have effaced. At around 36 weeks, your caregiver will check your cervix. At the same time, your caregiver will also perform a test on the secretions of the vaginal tissue. This test is to determine if a type of bacteria, Group B streptococcus, is present. Your caregiver will explain this further. Your caregiver may ask you:  What your birth plan is.  How you are feeling.  If you are feeling the baby move.  If you have had any abnormal symptoms, such as leaking fluid, bleeding, severe headaches, or abdominal cramping.  If you have any questions. Other tests or screenings that may be performed during your third trimester include:  Blood tests that check for low iron levels (anemia).  Fetal testing to check the health, activity level, and growth of the fetus. Testing is done if you have certain medical conditions or if there are problems during the pregnancy. FALSE LABOR You may feel small, irregular contractions that   eventually go away. These are called Braxton Hicks contractions, or false labor. Contractions may last for hours, days, or even weeks before true labor sets in. If contractions come at regular intervals, intensify, or become painful, it is best to be seen by your caregiver.  SIGNS OF LABOR   Menstrual-like cramps.  Contractions that are 5 minutes apart or less.  Contractions that start on the top of the uterus and spread down to the lower abdomen and back.  A sense of increased pelvic pressure or back  pain.  A watery or bloody mucus discharge that comes from the vagina. If you have any of these signs before the 37th week of pregnancy, call your caregiver right away. You need to go to the hospital to get checked immediately. HOME CARE INSTRUCTIONS   Avoid all smoking, herbs, alcohol, and unprescribed drugs. These chemicals affect the formation and growth of the baby.  Follow your caregiver's instructions regarding medicine use. There are medicines that are either safe or unsafe to take during pregnancy.  Exercise only as directed by your caregiver. Experiencing uterine cramps is a good sign to stop exercising.  Continue to eat regular, healthy meals.  Wear a good support bra for breast tenderness.  Do not use hot tubs, steam rooms, or saunas.  Wear your seat belt at all times when driving.  Avoid raw meat, uncooked cheese, cat litter boxes, and soil used by cats. These carry germs that can cause birth defects in the baby.  Take your prenatal vitamins.  Try taking a stool softener (if your caregiver approves) if you develop constipation. Eat more high-fiber foods, such as fresh vegetables or fruit and whole grains. Drink plenty of fluids to keep your urine clear or pale yellow.  Take warm sitz baths to soothe any pain or discomfort caused by hemorrhoids. Use hemorrhoid cream if your caregiver approves.  If you develop varicose veins, wear support hose. Elevate your feet for 15 minutes, 3-4 times a day. Limit salt in your diet.  Avoid heavy lifting, wear low heal shoes, and practice good posture.  Rest a lot with your legs elevated if you have leg cramps or low back pain.  Visit your dentist if you have not gone during your pregnancy. Use a soft toothbrush to brush your teeth and be gentle when you floss.  A sexual relationship may be continued unless your caregiver directs you otherwise.  Do not travel far distances unless it is absolutely necessary and only with the approval  of your caregiver.  Take prenatal classes to understand, practice, and ask questions about the labor and delivery.  Make a trial run to the hospital.  Pack your hospital bag.  Prepare the baby's nursery.  Continue to go to all your prenatal visits as directed by your caregiver. SEEK MEDICAL CARE IF:  You are unsure if you are in labor or if your water has broken.  You have dizziness.  You have mild pelvic cramps, pelvic pressure, or nagging pain in your abdominal area.  You have persistent nausea, vomiting, or diarrhea.  You have a bad smelling vaginal discharge.  You have pain with urination. SEEK IMMEDIATE MEDICAL CARE IF:   You have a fever.  You are leaking fluid from your vagina.  You have spotting or bleeding from your vagina.  You have severe abdominal cramping or pain.  You have rapid weight loss or gain.  You have shortness of breath with chest pain.  You notice sudden or extreme swelling   of your face, hands, ankles, feet, or legs.  You have not felt your baby move in over an hour.  You have severe headaches that do not go away with medicine.  You have vision changes. Document Released: 08/28/2001 Document Revised: 09/08/2013 Document Reviewed: 11/04/2012 ExitCare Patient Information 2015 [employer]. This information is not intended to replace advice given to you by your health care provider. Make sure you discuss any questions you have with your health care provider.  

## 2014-04-28 LAB — STREP B DNA PROBE: GBSP: DETECTED

## 2014-04-29 ENCOUNTER — Encounter: Payer: Self-pay | Admitting: Family Medicine

## 2014-05-06 ENCOUNTER — Encounter: Payer: Medicaid Other | Admitting: Family Medicine

## 2014-05-16 ENCOUNTER — Inpatient Hospital Stay (HOSPITAL_COMMUNITY)
Admission: AD | Admit: 2014-05-16 | Discharge: 2014-05-17 | Disposition: A | Discharge status: Home / Self Care | Payer: Medicaid Other | Source: Ambulatory Visit | Attending: Obstetrics & Gynecology | Admitting: Obstetrics & Gynecology

## 2014-05-16 ENCOUNTER — Encounter (HOSPITAL_COMMUNITY): Payer: Self-pay | Admitting: *Deleted

## 2014-05-16 DIAGNOSIS — N949 Unspecified condition associated with female genital organs and menstrual cycle: Secondary | ICD-10-CM | POA: Insufficient documentation

## 2014-05-16 DIAGNOSIS — R51 Headache: Secondary | ICD-10-CM | POA: Insufficient documentation

## 2014-05-16 DIAGNOSIS — O99891 Other specified diseases and conditions complicating pregnancy: Secondary | ICD-10-CM | POA: Diagnosis not present

## 2014-05-16 DIAGNOSIS — O9989 Other specified diseases and conditions complicating pregnancy, childbirth and the puerperium: Principal | ICD-10-CM

## 2014-05-16 LAB — CBC
HCT: 35.3 % — ABNORMAL LOW (ref 36.0–46.0)
Hemoglobin: 12.2 g/dL (ref 12.0–15.0)
MCH: 28.9 pg (ref 26.0–34.0)
MCHC: 34.6 g/dL (ref 30.0–36.0)
MCV: 83.6 fL (ref 78.0–100.0)
Platelets: 190 10*3/uL (ref 150–400)
RBC: 4.22 MIL/uL (ref 3.87–5.11)
RDW: 13.9 % (ref 11.5–15.5)
WBC: 4.7 10*3/uL (ref 4.0–10.5)

## 2014-05-16 LAB — URINALYSIS, ROUTINE W REFLEX MICROSCOPIC
Bilirubin Urine: NEGATIVE
Glucose, UA: NEGATIVE mg/dL
HGB URINE DIPSTICK: NEGATIVE
Ketones, ur: NEGATIVE mg/dL
LEUKOCYTES UA: NEGATIVE
Nitrite: NEGATIVE
PH: 6.5 (ref 5.0–8.0)
Protein, ur: NEGATIVE mg/dL
Specific Gravity, Urine: <1.005 — ABNORMAL LOW (ref 1.005–1.030)
Urobilinogen, UA: >8 mg/dL — ABNORMAL HIGH (ref 0.0–1.0)

## 2014-05-16 MED ORDER — CYCLOBENZAPRINE HCL 5 MG PO TABS: 5.0000 mg | ORAL_TABLET | Freq: Three times a day (TID) | ORAL | Status: DC | PRN

## 2014-05-16 MED ORDER — METOCLOPRAMIDE HCL 10 MG PO TABS
10.0000 mg | ORAL_TABLET | Freq: Once | ORAL | Status: AC
  Administered 2014-05-16: 10 mg via ORAL
  Filled 2014-05-16: qty 1

## 2014-05-16 MED ORDER — ACETAMINOPHEN 500 MG PO TABS
1000.0000 mg | ORAL_TABLET | Freq: Once | ORAL | Status: AC
  Administered 2014-05-16: 1000 mg via ORAL
  Filled 2014-05-16: qty 2

## 2014-05-16 NOTE — MAU Provider Note (Signed)
Chief Complaint:  Headache and Vaginal Pain   Laiya DETRA BORES is a 28 y.o.  [id number] with IUP at [redacted]w[redacted]d presenting for Headache and Vaginal Pain She reports 10/10 frontal HA w/o N/V, vision changes, RUQ pain or LE swelling. She has history of similar "migraines". She denies any hx of Pre-eclampsia or HTN. She also reports pelvic pressure. Denies VB, LOF, or CTX and notes good Fetal movement. She denies any dysuria or vaginal pain/itching/discharge.   Menstrual History: OB History   Grav Para Term Preterm Abortions TAB SAB Ect Mult Living   0 0 0 0 0 0 2     No LMP recorded. Patient is pregnant.      Past Medical History  Diagnosis Date  . Bipolar 2 disorder   . [id number](914.7)     Past Surgical History  Procedure Laterality Date  . Wisdom tooth extraction    . No past surgeries      Family History  Problem Relation Age of Onset  . Bipolar disorder Mother   . Diabetes Mother   . Bipolar disorder Sister   . Schizophrenia Sister     History  Substance Use Topics  . Smoking status: Never Smoker   . Smokeless tobacco: Never Used  . Alcohol Use: No     No Known Allergies  Prescriptions prior to admission  Medication Sig Dispense Refill  . pantoprazole (PROTONIX) 40 MG tablet Take 1 tablet (40 mg total) by mouth daily.  30 tablet  2  . Prenatal Vit-Fe Fumarate-FA (PRENATAL MULTIVITAMIN) TABS tablet Take 1 tablet by mouth daily.       Marland Kitchen triamcinolone cream (KENALOG) 0.1 % Apply 1 application topically 2 (two) times daily as needed (for itching.).         Review of Systems - Negative except for what is mentioned in HPI.  Physical Exam  Blood pressure 128/85, pulse 104, temperature 98.7 F (37.1 C), temperature source Oral, resp. rate 18, height  (1.702 m), weight 85.276 kg (188 lb), SpO2 98.00%. GENERAL: Well-developed, well-nourished female in no acute distress.  LUNGS: Clear to auscultation bilaterally.  HEART: Regular rate and rhythm. ABDOMEN: Soft,  nontender, nondistended, gravid.  EXTREMITIES: Nontender, no edema, 2+ distal pulses. Presentation: cephalic FHT:  Baseline rate 150 bpm   Variability moderate  Accelerations present   Decelerations none Contractions: Irregular Dilation: 1.5 Effacement (%): 20 Cervical Position: Posterior Station: -3 Presentation: Vertex Exam by:: Dr. Gayla Doss resident   Labs: Results for orders placed during the hospital encounter of 05/16/14 (from the past 24 hour(s))  URINALYSIS, ROUTINE W REFLEX MICROSCOPIC   Collection Time    05/16/14  9:55 PM      Result Value Ref Range   Color, Urine YELLOW  YELLOW   APPearance CLEAR  CLEAR   Specific Gravity, Urine <1.005 (*) 1.005 - 1.030   pH 6.5  5.0 - 8.0   Glucose, UA NEGATIVE  NEGATIVE mg/dL   Hgb urine dipstick NEGATIVE  NEGATIVE   Bilirubin Urine NEGATIVE  NEGATIVE   Ketones, ur NEGATIVE  NEGATIVE mg/dL   Protein, ur NEGATIVE  NEGATIVE mg/dL   Urobilinogen, UA >8.2 (*) 0.0 - 1.0 mg/dL   Nitrite NEGATIVE  NEGATIVE   Leukocytes, UA NEGATIVE  NEGATIVE   Results for orders placed during the hospital encounter of 05/16/14 (from the past 24 hour(s))  URINALYSIS, ROUTINE W REFLEX MICROSCOPIC     Status: Abnormal   Collection Time    05/16/14  9:55 PM  Result Value Ref Range   Color, Urine YELLOW  YELLOW   APPearance CLEAR  CLEAR   Specific Gravity, Urine <1.005 (*) 1.005 - 1.030   pH 6.5  5.0 - 8.0   Glucose, UA NEGATIVE  NEGATIVE mg/dL   Hgb urine dipstick NEGATIVE  NEGATIVE   Bilirubin Urine NEGATIVE  NEGATIVE   Ketones, ur NEGATIVE  NEGATIVE mg/dL   Protein, ur NEGATIVE  NEGATIVE mg/dL   Urobilinogen, UA >1.6 (*) 0.0 - 1.0 mg/dL   Nitrite NEGATIVE  NEGATIVE   Leukocytes, UA NEGATIVE  NEGATIVE  PROTEIN / CREATININE RATIO, URINE     Status: None   Collection Time    05/16/14  9:55 PM      Result Value Ref Range   Creatinine, Urine 110.24     Total Protein, Urine 15.8     PROTEIN CREATININE RATIO 0.14  0.00 - 0.15  CBC     Status:  Abnormal   Collection Time    05/16/14 11:43 PM      Result Value Ref Range   WBC 4.7  4.0 - 10.5 K/uL   RBC 4.22  3.87 - 5.11 MIL/uL   Hemoglobin 12.2  12.0 - 15.0 g/dL   HCT 10.9 (*) 60.4 - 54.0 %   MCV 83.6  78.0 - 100.0 fL   MCH 28.9  26.0 - 34.0 pg   MCHC 34.6  30.0 - 36.0 g/dL   RDW 98.1  19.1 - 47.8 %   Platelets 190  150 - 400 K/uL  COMPREHENSIVE METABOLIC PANEL     Status: Abnormal   Collection Time    05/16/14 11:43 PM      Result Value Ref Range   Sodium 137  137 - 147 mEq/L   Potassium 3.8  3.7 - 5.3 mEq/L   Chloride 103  96 - 112 mEq/L   CO2 21  19 - 32 mEq/L   Glucose, Bld 87  70 - 99 mg/dL   BUN 4 (*) 6 - 23 mg/dL   Creatinine, Ser 2.95  0.50 - 1.10 mg/dL   Calcium 8.4  8.4 - 62.1 mg/dL   Total Protein 6.1  6.0 - 8.3 g/dL   Albumin 2.3 (*) 3.5 - 5.2 g/dL   AST 17  0 - 37 U/L   ALT 13  0 - 35 U/L   Alkaline Phosphatase 252 (*) 39 - 117 U/L   Total Bilirubin 0.4  0.3 - 1.2 mg/dL   GFR calc non Af Amer >90  >90 mL/min   GFR calc Af Amer >90  >90 mL/min   Anion gap 13  5 - 15     Imaging Studies:  No results found.  Assessment: LILIT CINELLI is  27 y.o. G3P2002 at [redacted]w[redacted]d presents with Headache and Vaginal Pain BP wnl without additional symptoms of Pre-eclampsia. HA improved with Reglan and Tylenol. Cervix 1.5 cm; Irregular ctx in MAU; Observed for > 1hr. CBC, CMP and Urine Pr/Cr drawn and will f/u at [facility] in two days  Plan: HA; Hx of Migraine; No additional signs of Pre-e - Flexeril 5 mg # 15 given - Advised Rest; Benadryl and Tylenol - Reviewed signs of Pre-e and Labor - Will f/u on Pre-e labs in clinic in 2 days  Wenda Low 8/30/201511:36 PM  I examined pt and agree with documentation above and resident plan of care. Eino Farber Kennith Gain, CNM

## 2014-05-16 NOTE — MAU Note (Signed)
Pt. To have lab work drawn and then to be discharged.

## 2014-05-16 NOTE — MAU Note (Signed)
Pt reports vaginal pain since yesterday. Also reports a headache since yesterday and feels short of breath sometimes.

## 2014-05-16 NOTE — Discharge Instructions (Signed)
Third Trimester of Pregnancy The third trimester is from week 29 through week 42, months 7 through 9. The third trimester is a time when the fetus is growing rapidly. At the end of the ninth month, the fetus is about 20 inches in length and weighs 6-10 pounds.  BODY CHANGES Your body goes through many changes during pregnancy. The changes vary from woman to woman.   Your weight will continue to increase. You can expect to gain 25-35 pounds (11-16 kg) by the end of the pregnancy.  You may begin to get stretch marks on your hips, abdomen, and breasts.  You may urinate more often because the fetus is moving lower into your pelvis and pressing on your bladder.  You may develop or continue to have heartburn as a result of your pregnancy.  You may develop constipation because certain hormones are causing the muscles that push waste through your intestines to slow down.  You may develop hemorrhoids or swollen, bulging veins (varicose veins).  You may have pelvic pain because of the weight gain and pregnancy hormones relaxing your joints between the bones in your pelvis. Backaches may result from overexertion of the muscles supporting your posture.  You may have changes in your hair. These can include thickening of your hair, rapid growth, and changes in texture. Some women also have hair loss during or after pregnancy, or hair that feels dry or thin. Your hair will most likely return to normal after your baby is born.  Your breasts will continue to grow and be tender. A yellow discharge may leak from your breasts called colostrum.  Your belly button may stick out.  You may feel short of breath because of your expanding uterus.  You may notice the fetus "dropping," or moving lower in your abdomen.  You may have a bloody mucus discharge. This usually occurs a few days to a week before labor begins.  Your cervix becomes thin and soft (effaced) near your due date. WHAT TO EXPECT AT YOUR PRENATAL  EXAMS  You will have prenatal exams every 2 weeks until week 36. Then, you will have weekly prenatal exams. During a routine prenatal visit:  You will be weighed to make sure you and the fetus are growing normally.  Your blood pressure is taken.  Your abdomen will be measured to track your baby's growth.  The fetal heartbeat will be listened to.  Any test results from the previous visit will be discussed.  You may have a cervical check near your due date to see if you have effaced. At around 36 weeks, your caregiver will check your cervix. At the same time, your caregiver will also perform a test on the secretions of the vaginal tissue. This test is to determine if a type of bacteria, Group B streptococcus, is present. Your caregiver will explain this further. Your caregiver may ask you:  What your birth plan is.  How you are feeling.  If you are feeling the baby move.  If you have had any abnormal symptoms, such as leaking fluid, bleeding, severe headaches, or abdominal cramping.  If you have any questions. Other tests or screenings that may be performed during your third trimester include:  Blood tests that check for low iron levels (anemia).  Fetal testing to check the health, activity level, and growth of the fetus. Testing is done if you have certain medical conditions or if there are problems during the pregnancy. FALSE LABOR You may feel small, irregular contractions that   eventually go away. These are called Braxton Hicks contractions, or false labor. Contractions may last for hours, days, or even weeks before true labor sets in. If contractions come at regular intervals, intensify, or become painful, it is best to be seen by your caregiver.  SIGNS OF LABOR   Menstrual-like cramps.  Contractions that are 5 minutes apart or less.  Contractions that start on the top of the uterus and spread down to the lower abdomen and back.  A sense of increased pelvic pressure or back  pain.  A watery or bloody mucus discharge that comes from the vagina. If you have any of these signs before the 37th week of pregnancy, call your caregiver right away. You need to go to the hospital to get checked immediately. HOME CARE INSTRUCTIONS   Avoid all smoking, herbs, alcohol, and unprescribed drugs. These chemicals affect the formation and growth of the baby.  Follow your caregiver's instructions regarding medicine use. There are medicines that are either safe or unsafe to take during pregnancy.  Exercise only as directed by your caregiver. Experiencing uterine cramps is a good sign to stop exercising.  Continue to eat regular, healthy meals.  Wear a good support bra for breast tenderness.  Do not use hot tubs, steam rooms, or saunas.  Wear your seat belt at all times when driving.  Avoid raw meat, uncooked cheese, cat litter boxes, and soil used by cats. These carry germs that can cause birth defects in the baby.  Take your prenatal vitamins.  Try taking a stool softener (if your caregiver approves) if you develop constipation. Eat more high-fiber foods, such as fresh vegetables or fruit and whole grains. Drink plenty of fluids to keep your urine clear or pale yellow.  Take warm sitz baths to soothe any pain or discomfort caused by hemorrhoids. Use hemorrhoid cream if your caregiver approves.  If you develop varicose veins, wear support hose. Elevate your feet for 15 minutes, 3-4 times a day. Limit salt in your diet.  Avoid heavy lifting, wear low heal shoes, and practice good posture.  Rest a lot with your legs elevated if you have leg cramps or low back pain.  Visit your dentist if you have not gone during your pregnancy. Use a soft toothbrush to brush your teeth and be gentle when you floss.  A sexual relationship may be continued unless your caregiver directs you otherwise.  Do not travel far distances unless it is absolutely necessary and only with the approval  of your caregiver.  Take prenatal classes to understand, practice, and ask questions about the labor and delivery.  Make a trial run to the hospital.  Pack your hospital bag.  Prepare the baby's nursery.  Continue to go to all your prenatal visits as directed by your caregiver. SEEK MEDICAL CARE IF:  You are unsure if you are in labor or if your water has broken.  You have dizziness.  You have mild pelvic cramps, pelvic pressure, or nagging pain in your abdominal area.  You have persistent nausea, vomiting, or diarrhea.  You have a bad smelling vaginal discharge.  You have pain with urination. SEEK IMMEDIATE MEDICAL CARE IF:   You have a fever.  You are leaking fluid from your vagina.  You have spotting or bleeding from your vagina.  You have severe abdominal cramping or pain.  You have rapid weight loss or gain.  You have shortness of breath with chest pain.  You notice sudden or extreme swelling   of your face, hands, ankles, feet, or legs.  You have not felt your baby move in over an hour.  You have severe headaches that do not go away with medicine.  You have vision changes. Document Released: 08/28/2001 Document Revised: 09/08/2013 Document Reviewed: 11/04/2012 Exit[employer] Patient Information 2015 [employer]. This information is not intended to replace advice given to you by your health care provider. Make sure you discuss any questions you have with your health care provider.  

## 2014-05-17 LAB — COMPREHENSIVE METABOLIC PANEL
ALK PHOS: 252 U/L — AB (ref 39–117)
ALT: 13 U/L (ref 0–35)
ANION GAP: 13 (ref 5–15)
AST: 17 U/L (ref 0–37)
Albumin: 2.3 g/dL — ABNORMAL LOW (ref 3.5–5.2)
BILIRUBIN TOTAL: 0.4 mg/dL (ref 0.3–1.2)
BUN: 4 mg/dL — AB (ref 6–23)
CHLORIDE: 103 meq/L (ref 96–112)
CO2: 21 mEq/L (ref 19–32)
Calcium: 8.4 mg/dL (ref 8.4–10.5)
Creatinine, Ser: 0.6 mg/dL (ref 0.50–1.10)
GFR calc non Af Amer: >90 mL/min (ref >90)
Glucose, Bld: 87 mg/dL (ref 70–99)
POTASSIUM: 3.8 meq/L (ref 3.7–5.3)
Sodium: 137 mEq/L (ref 137–147)
Total Protein: 6.1 g/dL (ref 6.0–8.3)

## 2014-05-17 LAB — PROTEIN / CREATININE RATIO, URINE
Creatinine, Urine: 110.24 mg/dL
Protein Creatinine Ratio: 0.14 (ref 0.00–0.15)
Total Protein, Urine: 15.8 mg/dL

## 2014-05-18 ENCOUNTER — Ambulatory Visit (INDEPENDENT_AMBULATORY_CARE_PROVIDER_SITE_OTHER): Payer: Self-pay | Admitting: Family Medicine

## 2014-05-18 VITALS — BP 110/70 | HR 88 | Wt 189.0 lb

## 2014-05-18 DIAGNOSIS — Z3480 Encounter for supervision of other normal pregnancy, unspecified trimester: Secondary | ICD-10-CM

## 2014-05-18 DIAGNOSIS — Z3483 Encounter for supervision of other normal pregnancy, third trimester: Secondary | ICD-10-CM

## 2014-05-18 NOTE — Patient Instructions (Signed)
Third Trimester of Pregnancy The third trimester is from week 29 through week 42, months 7 through 9. The third trimester is a time when the fetus is growing rapidly. At the end of the ninth month, the fetus is about 20 inches in length and weighs 6-10 pounds.  BODY CHANGES Your body goes through many changes during pregnancy. The changes vary from woman to woman.   Your weight will continue to increase. You can expect to gain 25-35 pounds (11-16 kg) by the end of the pregnancy.  You may begin to get stretch marks on your hips, abdomen, and breasts.  You may urinate more often because the fetus is moving lower into your pelvis and pressing on your bladder.  You may develop or continue to have heartburn as a result of your pregnancy.  You may develop constipation because certain hormones are causing the muscles that push waste through your intestines to slow down.  You may develop hemorrhoids or swollen, bulging veins (varicose veins).  You may have pelvic pain because of the weight gain and pregnancy hormones relaxing your joints between the bones in your pelvis. Backaches may result from overexertion of the muscles supporting your posture.  You may have changes in your hair. These can include thickening of your hair, rapid growth, and changes in texture. Some women also have hair loss during or after pregnancy, or hair that feels dry or thin. Your hair will most likely return to normal after your baby is born.  Your breasts will continue to grow and be tender. A yellow discharge may leak from your breasts called colostrum.  Your belly button may stick out.  You may feel short of breath because of your expanding uterus.  You may notice the fetus "dropping," or moving lower in your abdomen.  You may have a bloody mucus discharge. This usually occurs a few days to a week before labor begins.  Your cervix becomes thin and soft (effaced) near your due date. WHAT TO EXPECT AT YOUR PRENATAL  EXAMS  You will have prenatal exams every 2 weeks until week 36. Then, you will have weekly prenatal exams. During a routine prenatal visit:  You will be weighed to make sure you and the fetus are growing normally.  Your blood pressure is taken.  Your abdomen will be measured to track your baby's growth.  The fetal heartbeat will be listened to.  Any test results from the previous visit will be discussed.  You may have a cervical check near your due date to see if you have effaced. At around 36 weeks, your caregiver will check your cervix. At the same time, your caregiver will also perform a test on the secretions of the vaginal tissue. This test is to determine if a type of bacteria, Group B streptococcus, is present. Your caregiver will explain this further. Your caregiver may ask you:  What your birth plan is.  How you are feeling.  If you are feeling the baby move.  If you have had any abnormal symptoms, such as leaking fluid, bleeding, severe headaches, or abdominal cramping.  If you have any questions. Other tests or screenings that may be performed during your third trimester include:  Blood tests that check for low iron levels (anemia).  Fetal testing to check the health, activity level, and growth of the fetus. Testing is done if you have certain medical conditions or if there are problems during the pregnancy. FALSE LABOR You may feel small, irregular contractions that   eventually go away. These are called Braxton Hicks contractions, or false labor. Contractions may last for hours, days, or even weeks before true labor sets in. If contractions come at regular intervals, intensify, or become painful, it is best to be seen by your caregiver.  SIGNS OF LABOR   Menstrual-like cramps.  Contractions that are 5 minutes apart or less.  Contractions that start on the top of the uterus and spread down to the lower abdomen and back.  A sense of increased pelvic pressure or back  pain.  A watery or bloody mucus discharge that comes from the vagina. If you have any of these signs before the 37th week of pregnancy, call your caregiver right away. You need to go to the hospital to get checked immediately. HOME CARE INSTRUCTIONS   Avoid all smoking, herbs, alcohol, and unprescribed drugs. These chemicals affect the formation and growth of the baby.  Follow your caregiver's instructions regarding medicine use. There are medicines that are either safe or unsafe to take during pregnancy.  Exercise only as directed by your caregiver. Experiencing uterine cramps is a good sign to stop exercising.  Continue to eat regular, healthy meals.  Wear a good support bra for breast tenderness.  Do not use hot tubs, steam rooms, or saunas.  Wear your seat belt at all times when driving.  Avoid raw meat, uncooked cheese, cat litter boxes, and soil used by cats. These carry germs that can cause birth defects in the baby.  Take your prenatal vitamins.  Try taking a stool softener (if your caregiver approves) if you develop constipation. Eat more high-fiber foods, such as fresh vegetables or fruit and whole grains. Drink plenty of fluids to keep your urine clear or pale yellow.  Take warm sitz baths to soothe any pain or discomfort caused by hemorrhoids. Use hemorrhoid cream if your caregiver approves.  If you develop varicose veins, wear support hose. Elevate your feet for 15 minutes, 3-4 times a day. Limit salt in your diet.  Avoid heavy lifting, wear low heal shoes, and practice good posture.  Rest a lot with your legs elevated if you have leg cramps or low back pain.  Visit your dentist if you have not gone during your pregnancy. Use a soft toothbrush to brush your teeth and be gentle when you floss.  A sexual relationship may be continued unless your caregiver directs you otherwise.  Do not travel far distances unless it is absolutely necessary and only with the approval  of your caregiver.  Take prenatal classes to understand, practice, and ask questions about the labor and delivery.  Make a trial run to the hospital.  Pack your hospital bag.  Prepare the baby's nursery.  Continue to go to all your prenatal visits as directed by your caregiver. SEEK MEDICAL CARE IF:  You are unsure if you are in labor or if your water has broken.  You have dizziness.  You have mild pelvic cramps, pelvic pressure, or nagging pain in your abdominal area.  You have persistent nausea, vomiting, or diarrhea.  You have a bad smelling vaginal discharge.  You have pain with urination. SEEK IMMEDIATE MEDICAL CARE IF:   You have a fever.  You are leaking fluid from your vagina.  You have spotting or bleeding from your vagina.  You have severe abdominal cramping or pain.  You have rapid weight loss or gain.  You have shortness of breath with chest pain.  You notice sudden or extreme swelling   of your face, hands, ankles, feet, or legs.  You have not felt your baby move in over an hour.  You have severe headaches that do not go away with medicine.  You have vision changes. Document Released: 08/28/2001 Document Revised: 09/08/2013 Document Reviewed: 11/04/2012 ExitCare Patient Information 2015 [employer]. This information is not intended to replace advice given to you by your health care provider. Make sure you discuss any questions you have with your health care provider.  

## 2014-05-18 NOTE — Progress Notes (Signed)
Rhylin KETA VANVALKENBURGH is a 28 y.o. G3P2 at [redacted]w[redacted]d for routine follow up.  She reports some irregualr ctx ~ 30 mins w/o LOF or VB and notes good FM.  See flow sheet for details.  A/P: Pregnancy at [redacted]w[redacted]d.  Doing well.   Pregnancy issues include HA  Infant feeding choice: bottle Contraception choice: Depo Infant circumcision desired Yes - as outpatient; considering [facility] GBS/GC/CZ results were reviewed today.   Labor precautions reviewed. Kick counts reviewed.

## 2014-05-23 ENCOUNTER — Encounter (HOSPITAL_COMMUNITY): Payer: Self-pay | Admitting: *Deleted

## 2014-05-23 ENCOUNTER — Inpatient Hospital Stay (HOSPITAL_COMMUNITY)
Admission: AD | Admit: 2014-05-23 | Discharge: 2014-05-23 | Disposition: A | Discharge status: Home / Self Care | Payer: Medicaid Other | Source: Ambulatory Visit | Attending: Family Medicine | Admitting: Family Medicine

## 2014-05-23 DIAGNOSIS — R51 Headache: Secondary | ICD-10-CM | POA: Diagnosis not present

## 2014-05-23 DIAGNOSIS — O479 False labor, unspecified: Secondary | ICD-10-CM | POA: Diagnosis present

## 2014-05-23 NOTE — MAU Note (Signed)
Swelling in lower legs and feet. Headaches on and off the entire pregnancy. Contractions.

## 2014-05-23 NOTE — Discharge Instructions (Signed)

## 2014-05-23 NOTE — Progress Notes (Signed)
Pt sleeping. 

## 2014-05-23 NOTE — Progress Notes (Signed)
Written and verbal d/c instructions given and understanding voiced for labor precautions. To keep feet elevated and may try compression socks as discusse with pt by Threasa Heads CNM

## 2014-05-23 NOTE — MAU Note (Signed)
Margarita Mail CNM in to see pt and discuss in lower legs and feet.

## 2014-05-25 ENCOUNTER — Telehealth: Payer: Self-pay | Admitting: Family Medicine

## 2014-05-25 NOTE — Telephone Encounter (Signed)
Sara Manning is a 28 y.o. female G3P2002 at [redacted]w[redacted]d. Dennie Bible states she was seen in MAU on the 6th for swelling of her lower extremities. She reports they monitor her and tell her that her "baby is ok" and send her home. In review of her BP they have been normal. She denies dizziness, nausea, vomit or RUQ. She endorses pain in "private" and in stomach ("towards the bottom part") . In addition she endorses headache. She states her right>left leg has been swelling and is burning.    A/P: Likely patient with pregnancy related swelling in her extremities. Her BP appears to have been normal throughout pregnancy by flowcharts. Labs on the 8/30 unremarkable, no labs collected 05/23/2014.   - pt advised to go to MAU if she feels contraction like pain, leaking of fluid, vaginal bleeding, RUQ pain, persistent headache, dizziness, nausea or vomit. In addition if her leg continues to swell or remains painful she should be seen at MAU tonight (concern for DVT with description) in addition it does not sound if preeclampsia is a concern with prior work up, but may need to be repeated if she continues to be symptomatic.  - pt advised if she feels the swelling/pain in her leg is unchanged from when they discharged her 2 days ago and she is not having any of the above mentioned symptoms, she can call in to the clinic tomorrow morning and be seen in [facility] for her R>L leg swelling.  - in the meantime she is to keep her legs elevated.   Runell Kovich DO

## 2014-05-27 ENCOUNTER — Ambulatory Visit (INDEPENDENT_AMBULATORY_CARE_PROVIDER_SITE_OTHER): Payer: Self-pay | Admitting: Family Medicine

## 2014-05-27 VITALS — BP 112/84 | HR 88 | Wt 196.0 lb

## 2014-05-27 DIAGNOSIS — Z3483 Encounter for supervision of other normal pregnancy, third trimester: Secondary | ICD-10-CM

## 2014-05-27 DIAGNOSIS — Z3480 Encounter for supervision of other normal pregnancy, unspecified trimester: Secondary | ICD-10-CM

## 2014-05-27 NOTE — Assessment & Plan Note (Signed)
Patient continues to do well.  Reports irregular contractions and good fetal movement.  Denies loss of fluid or vaginal bleeding - Lower extremity edema; BP normal, recent preeclampsia labs within normal limits - Scheduled for induction on 9/12. Call and discuss this with Dorathy Kinsman. Due to [facility] and retreat no family medicine resident would be available to begin induction on Saturday but will check on pt Sunday afternoon upon return.

## 2014-05-27 NOTE — Progress Notes (Signed)
Sara Manning is a 28 y.o. G3P2 at [redacted]w[redacted]d for routine follow up.  She reports some LE swelling recently evaluated in MAU. Denies HA, RUQ pain or vision changes.   See flow sheet for details. Cevix in 1/50/-3.   A/P: Pregnancy at [redacted]w[redacted]d.  Doing well.   Pregnancy issues include: LE Swelling, GBS +  Infant feeding choice bottle Contraception choice: Depo Infant circumcision desired at [facility] GBS/GC/CZ results  were reviewed today.   Labor precautions reviewed. Kick counts reviewed. Twice weekly testing scheduled. Induction scheduled for 41+ weeks on 05/29/14; will likely need a Foley bulb, and by mouth Cytotec for induction.  Called and discussed this with [place].  Do to [facility] retreat known family medicine resident will be available for beginning induction , but will be available to check on patient starting Sunday afternoon/evening.

## 2014-05-28 ENCOUNTER — Telehealth (HOSPITAL_COMMUNITY): Payer: Self-pay | Admitting: *Deleted

## 2014-05-28 NOTE — Telephone Encounter (Signed)
Preadmission screen  

## 2014-05-29 ENCOUNTER — Inpatient Hospital Stay (HOSPITAL_COMMUNITY): Admission: RE | Admit: 2014-05-29 | Payer: Medicaid Other | Source: Ambulatory Visit

## 2014-05-30 ENCOUNTER — Encounter (HOSPITAL_COMMUNITY): Payer: Self-pay | Admitting: *Deleted

## 2014-05-30 ENCOUNTER — Inpatient Hospital Stay (HOSPITAL_COMMUNITY)
Admission: AD | Admit: 2014-05-30 | Discharge: 2014-06-01 | DRG: 775 | Disposition: A | Discharge status: Home / Self Care | Payer: Medicaid Other | Source: Ambulatory Visit | Attending: Obstetrics & Gynecology | Admitting: Obstetrics & Gynecology

## 2014-05-30 DIAGNOSIS — Z833 Family history of diabetes mellitus: Secondary | ICD-10-CM | POA: Diagnosis not present

## 2014-05-30 DIAGNOSIS — O480 Post-term pregnancy: Principal | ICD-10-CM | POA: Diagnosis present

## 2014-05-30 LAB — CBC
HCT: 35.1 % — ABNORMAL LOW (ref 36.0–46.0)
Hemoglobin: 11.8 g/dL — ABNORMAL LOW (ref 12.0–15.0)
MCH: 28.2 pg (ref 26.0–34.0)
MCHC: 33.6 g/dL (ref 30.0–36.0)
MCV: 84 fL (ref 78.0–100.0)
Platelets: 188 10*3/uL (ref 150–400)
RBC: 4.18 MIL/uL (ref 3.87–5.11)
RDW: 14.3 % (ref 11.5–15.5)
WBC: 5.8 10*3/uL (ref 4.0–10.5)

## 2014-05-30 LAB — RPR

## 2014-05-30 LAB — HIV ANTIBODY (ROUTINE TESTING W REFLEX): HIV: NONREACTIVE

## 2014-05-30 MED ORDER — DIPHENHYDRAMINE HCL 25 MG PO CAPS
25.0000 mg | ORAL_CAPSULE | Freq: Four times a day (QID) | ORAL | Status: DC | PRN
Start: 2014-05-30 — End: 2014-06-01

## 2014-05-30 MED ORDER — OXYCODONE-ACETAMINOPHEN 5-325 MG PO TABS: 2.0000 | ORAL_TABLET | ORAL | Status: DC | PRN

## 2014-05-30 MED ORDER — OXYTOCIN BOLUS FROM INFUSION: 500.0000 mL | INTRAVENOUS | Status: DC

## 2014-05-30 MED ORDER — IBUPROFEN 600 MG PO TABS
600.0000 mg | ORAL_TABLET | Freq: Four times a day (QID) | ORAL | Status: DC
  Administered 2014-05-30 – 2014-06-01 (×7): 600 mg via ORAL
  Filled 2014-05-30 (×7): qty 1

## 2014-05-30 MED ORDER — CITRIC ACID-SODIUM CITRATE 334-500 MG/5ML PO SOLN: 30.0000 mL | ORAL | Status: DC | PRN

## 2014-05-30 MED ORDER — TETANUS-DIPHTH-ACELL PERTUSSIS 5-2.5-18.5 LF-MCG/0.5 IM SUSP
0.5000 mL | Freq: Once | INTRAMUSCULAR | Status: AC
  Administered 2014-05-31: 0.5 mL via INTRAMUSCULAR
  Filled 2014-05-30: qty 0.5

## 2014-05-30 MED ORDER — ACETAMINOPHEN 325 MG PO TABS
650.0000 mg | ORAL_TABLET | ORAL | Status: DC | PRN
  Administered 2014-05-30: 650 mg via ORAL
  Filled 2014-05-30: qty 2

## 2014-05-30 MED ORDER — ONDANSETRON HCL 4 MG PO TABS: 4.0000 mg | ORAL_TABLET | ORAL | Status: DC | PRN

## 2014-05-30 MED ORDER — LANOLIN HYDROUS EX OINT: TOPICAL_OINTMENT | CUTANEOUS | Status: DC | PRN

## 2014-05-30 MED ORDER — OXYCODONE-ACETAMINOPHEN 5-325 MG PO TABS
1.0000 | ORAL_TABLET | ORAL | Status: DC | PRN
  Filled 2014-05-30: qty 1

## 2014-05-30 MED ORDER — SIMETHICONE 80 MG PO CHEW: 80.0000 mg | CHEWABLE_TABLET | ORAL | Status: DC | PRN

## 2014-05-30 MED ORDER — ONDANSETRON HCL 4 MG/2ML IJ SOLN
4.0000 mg | Freq: Four times a day (QID) | INTRAMUSCULAR | Status: DC | PRN
  Administered 2014-05-30: 4 mg via INTRAVENOUS
  Filled 2014-05-30: qty 2

## 2014-05-30 MED ORDER — ONDANSETRON HCL 4 MG/2ML IJ SOLN: 4.0000 mg | INTRAMUSCULAR | Status: DC | PRN

## 2014-05-30 MED ORDER — OXYTOCIN 40 UNITS IN LACTATED RINGERS INFUSION - SIMPLE MED
62.5000 mL/h | INTRAVENOUS | Status: DC
Start: 2014-05-30 — End: 2014-05-30
  Filled 2014-05-30: qty 1000

## 2014-05-30 MED ORDER — OXYTOCIN 40 UNITS IN LACTATED RINGERS INFUSION - SIMPLE MED
1.0000 m[IU]/min | INTRAVENOUS | Status: DC
  Administered 2014-05-30: 2 m[IU]/min via INTRAVENOUS

## 2014-05-30 MED ORDER — FENTANYL CITRATE 0.05 MG/ML IJ SOLN: 50.0000 ug | INTRAMUSCULAR | Status: DC | PRN

## 2014-05-30 MED ORDER — BENZOCAINE-MENTHOL 20-0.5 % EX AERO
1.0000 "application " | INHALATION_SPRAY | CUTANEOUS | Status: DC | PRN
  Administered 2014-05-31: 1 via TOPICAL
  Filled 2014-05-30 (×2): qty 56

## 2014-05-30 MED ORDER — FENTANYL CITRATE 0.05 MG/ML IJ SOLN
100.0000 ug | INTRAMUSCULAR | Status: DC | PRN
  Administered 2014-05-30 (×3): 100 ug via INTRAVENOUS
  Filled 2014-05-30 (×3): qty 2

## 2014-05-30 MED ORDER — LACTATED RINGERS IV SOLN
INTRAVENOUS | Status: DC
  Administered 2014-05-30: 02:00:00 via INTRAVENOUS

## 2014-05-30 MED ORDER — LIDOCAINE HCL (PF) 1 % IJ SOLN: 30.0000 mL | INTRAMUSCULAR | Status: DC | PRN

## 2014-05-30 MED ORDER — DEXTROSE 5 % IV SOLN
2.5000 10*6.[IU] | INTRAVENOUS | Status: DC
  Administered 2014-05-30 (×3): 2.5 10*6.[IU] via INTRAVENOUS
  Filled 2014-05-30 (×6): qty 2.5

## 2014-05-30 MED ORDER — LACTATED RINGERS IV SOLN: 500.0000 mL | INTRAVENOUS | Status: DC | PRN

## 2014-05-30 MED ORDER — DIBUCAINE 1 % RE OINT
1.0000 "application " | TOPICAL_OINTMENT | RECTAL | Status: DC | PRN
  Filled 2014-05-30: qty 28

## 2014-05-30 MED ORDER — OXYCODONE-ACETAMINOPHEN 5-325 MG PO TABS
1.0000 | ORAL_TABLET | ORAL | Status: DC | PRN
  Administered 2014-05-30 – 2014-06-01 (×6): 1 via ORAL
  Filled 2014-05-30 (×5): qty 1

## 2014-05-30 MED ORDER — WITCH HAZEL-GLYCERIN EX PADS: 1.0000 "application " | MEDICATED_PAD | CUTANEOUS | Status: DC | PRN

## 2014-05-30 MED ORDER — PENICILLIN G POTASSIUM 5000000 UNITS IJ SOLR
5.0000 10*6.[IU] | Freq: Once | INTRAVENOUS | Status: AC
  Administered 2014-05-30: 5 10*6.[IU] via INTRAVENOUS
  Filled 2014-05-30: qty 5

## 2014-05-30 MED ORDER — ZOLPIDEM TARTRATE 5 MG PO TABS: 5.0000 mg | ORAL_TABLET | Freq: Every evening | ORAL | Status: DC | PRN

## 2014-05-30 MED ORDER — PRENATAL MULTIVITAMIN CH
1.0000 | ORAL_TABLET | Freq: Every day | ORAL | Status: DC
  Administered 2014-05-31: 1 via ORAL
  Filled 2014-05-30: qty 1

## 2014-05-30 MED ORDER — TERBUTALINE SULFATE 1 MG/ML IJ SOLN: 0.2500 mg | Freq: Once | INTRAMUSCULAR | Status: DC | PRN

## 2014-05-30 MED ORDER — SENNOSIDES-DOCUSATE SODIUM 8.6-50 MG PO TABS
2.0000 | ORAL_TABLET | ORAL | Status: DC
  Administered 2014-05-30 – 2014-05-31 (×2): 2 via ORAL
  Filled 2014-05-30 (×2): qty 2

## 2014-05-30 NOTE — Progress Notes (Signed)
Sara Manning is a 28 y.o. G3P2002 at [redacted]w[redacted]d by ultrasound admitted for induction of labor due to Post dates. Due date 05/22/14.  Subjective: Doing well, no complaints.    Objective: BP 137/88  Pulse 94  Temp(Src) 97.8 F (36.6 C) (Oral)  Resp 18  Ht  (1.702 m)  Wt 196 lb (88.905 kg)  BMI 30.69 kg/m2      FHT:  FHR: 130 bpm, variability: moderate,  accelerations:  Present,  decelerations:  Absent UC:   regular, every 2-3 minutes SVE:   Dilation: 3 Effacement (%): 80 Station: -2 Exam by:: lawson, cnm  Labs: Lab Results  Component Value Date   WBC 5.8 05/30/2014   HGB 11.8* 05/30/2014   HCT 35.1* 05/30/2014   MCV 84.0 05/30/2014   PLT 188 05/30/2014    Assessment / Plan: Induction of labor due to postterm,  progressing well on pitocin  Labor: Progressing normally on Pitocin, AROM at 4 PM Preeclampsia:  no signs or symptoms of toxicity Fetal Wellbeing:  Category I Pain Control:  Labor support without medications I/D:  n/a Anticipated MOD:  NSVD  Gildardo Cranker 05/30/2014, 5:22 PM

## 2014-05-30 NOTE — Lactation Note (Signed)
This note was copied from the chart of Sara Ciarra Braddy. Lactation Consultation Note  Patient Name: Sara Manning [id number] Date: 05/30/2014 Reason for consult: Initial assessment;Other (Comment) (charting for exclusion)   Maternal Data Formula Feeding for Exclusion: Yes Reason for exclusion: Mother's choice to formula feed on admision  Feeding Feeding Type: Formula Nipple Type: Regular  LATCH Score/Interventions                      Lactation Tools Discussed/Used     Consult Status Consult Status: Complete    Lynda Rainwater 05/30/2014, 11:56 PM

## 2014-05-30 NOTE — Progress Notes (Signed)
MB unit RN asked for RN to wait about 20 min before bringing pt

## 2014-05-30 NOTE — Progress Notes (Signed)
Bethanny MAHLI GLAHN is a 28 y.o. G3P2002 at [redacted]w[redacted]d by ultrasound admitted for induction of labor due to Post dates. Due date 05/22/14.  Subjective:   Objective: BP 122/69  Pulse 84  Temp(Src) 97.6 F (36.4 C) (Oral)  Resp 18  Ht  (1.702 m)  Wt 196 lb (88.905 kg)  BMI 30.69 kg/m2      FHT:  FHR: 120 bpm, variability: moderate,  accelerations:  Present,  decelerations:  Absent UC:   regular, every 2 minutes SVE:   Dilation: 3 Effacement (%): 80 Station: -2;-3 Exam by:: hk  Labs: Lab Results  Component Value Date   WBC 5.8 05/30/2014   HGB 11.8* 05/30/2014   HCT 35.1* 05/30/2014   MCV 84.0 05/30/2014   PLT 188 05/30/2014    Assessment / Plan: Induction of labor due to postterm,  progressing well on pitocin  Labor: Progressing on Pitocin, will continue to increase then AROM Preeclampsia:  no signs or symptoms of toxicity Fetal Wellbeing:  Category I Pain Control:  Labor support without medications I/D:  n/a Anticipated MOD:  NSVD  LAWSON, MARIE DARLENE 05/30/2014, 9:27 AM

## 2014-05-30 NOTE — Progress Notes (Signed)
Twila AYERIM BERQUIST is a 28 y.o. G3P2002 at [redacted]w[redacted]d by ultrasound admitted for induction of labor due to Post dates. Due date 05/22/14.  Subjective:   Objective: BP 100/53  Pulse 70  Temp(Src) 97.6 F (36.4 C) (Oral)  Resp 16  Ht  (1.702 m)  Wt 196 lb (88.905 kg)  BMI 30.69 kg/m2      FHT:  FHR: 130 bpm, variability: moderate,  accelerations:   ,  decelerations:  Absent UC:   irregular, every 2-5 minutes SVE:   Dilation: 3 Effacement (%): 80 Station: -2;-3 Exam by:: hk  Labs: Lab Results  Component Value Date   WBC 5.8 05/30/2014   HGB 11.8* 05/30/2014   HCT 35.1* 05/30/2014   MCV 84.0 05/30/2014   PLT 188 05/30/2014    Assessment / Plan: Induction of labor due to postterm,  progressing well on pitocin  Labor: Progressing on Pitocin, will continue to increase then AROM Preeclampsia:  no signs or symptoms of toxicity Fetal Wellbeing:  Category I Pain Control:  Labor support without medications I/D:  n/a Anticipated MOD:  NSVD  Whitfield Dulay DARLENE 05/30/2014, 11:54 AM

## 2014-05-30 NOTE — H&P (Signed)
Sara Manning is a 28 y.o. female presenting for Induction of labor for postdates. Maternal Medical History:  Reason for admission: Contractions.  Nausea.  Contractions: Onset was less than 1 hour ago.   Frequency: irregular.   Perceived severity is mild.    Fetal activity: Perceived fetal activity is normal.   Last perceived fetal movement was within the past hour.    Prenatal complications: No bleeding or HIV.   Prenatal Complications - Diabetes: none.    OB History   Grav Para Term Preterm Abortions TAB SAB Ect Mult Living   0 0 0 0 0 0 2     Past Medical History  Diagnosis Date  . Bipolar 2 disorder   . ZOXWRUEA(540.9)    Past Surgical History  Procedure Laterality Date  . Wisdom tooth extraction     Family History: family history includes Bipolar disorder in her mother and sister; Diabetes in her mother; Schizophrenia in her sister. Social History:  reports that she has never smoked. She has never used smokeless tobacco. She reports that she does not drink alcohol or use illicit drugs.    Review of Systems  Constitutional: Negative for fever, chills and malaise/fatigue.  Gastrointestinal: Positive for abdominal pain (with contractions). Negative for nausea, vomiting and diarrhea.  Genitourinary: Negative for dysuria.  Neurological: Negative for dizziness.    Dilation: 2 Effacement (%): 80 Station: -2 Exam by:: Artelia Laroche, CNM Blood pressure 119/73, pulse 78, temperature 98.4 F (36.9 C), temperature source Oral, resp. rate 20, height  (1.702 m), weight 196 lb (88.905 kg). Maternal Exam:  Uterine Assessment: Contraction strength is mild.  Contraction frequency is irregular.   Abdomen: Fundal height is 40.   Estimated fetal weight is 7.5.   Fetal presentation: vertex  Introitus: Normal vulva. Normal vagina.  Vagina is negative for discharge.  Ferning test: not done.  Nitrazine test: not done. Amniotic fluid character: not  assessed.  Pelvis: adequate for delivery.   Cervix: Cervix evaluated by digital exam.     Fetal Exam Fetal Monitor Review: Mode: ultrasound.   Baseline rate: 140.  Variability: moderate (6-25 bpm).   Pattern: accelerations present and no decelerations.    Fetal State Assessment: Category I - tracings are normal.     Physical Exam  Constitutional: She is oriented to person, place, and time. She appears well-developed and well-nourished. No distress.  HENT:  Head: Normocephalic.  Cardiovascular: Normal rate, regular rhythm and normal heart sounds.  Exam reveals no gallop and no friction rub.   No murmur heard. Respiratory: Effort normal and breath sounds normal. No respiratory distress. She has no wheezes. She has no rales.  GI: Soft. There is no tenderness. There is no rebound and no guarding.  Genitourinary: Vagina normal and uterus normal. No vaginal discharge found.  Dilation: 2 Effacement (%): 80 Station: -2 Presentation: Vertex Exam by:: Artelia Laroche, CNM   Musculoskeletal: Normal range of motion.  Neurological: She is alert and oriented to person, place, and time.  Skin: Skin is warm and dry.  Psychiatric: She has a normal mood and affect.    Prenatal labs: ABO, Rh: O/POS/-- (02/20 1103) Antibody: NEG (02/20 1103) Rubella: 2.51 (02/20 1103) RPR: NON REAC (06/22 1652)  HBsAg: NEGATIVE (02/20 1103)  HIV: NONREACTIVE (06/22 1652)  GBS: Detected (08/11 1642)   Assessment/Plan: A:  SIUP at [redacted]w[redacted]d       Post dates      Induction of labor  P:  [facility]  [facility]       Routine orders       Pitocin induction   [facility] 05/30/2014, 2:33 AM

## 2014-05-30 NOTE — H&P (Signed)
Attestation of Attending Supervision of Advanced Practitioner (PA/CNM/NP): Evaluation and management procedures were performed by the Advanced Practitioner under my supervision and collaboration.  I have reviewed the Advanced Practitioner's note and chart, and I agree with the management and plan.  Liesl Simons, MD, FACOG Attending Obstetrician & Gynecologist Faculty Practice, [facility]   

## 2014-05-30 NOTE — Progress Notes (Signed)
Sara Manning is a 28 y.o. G3P2002 at [redacted]w[redacted]d by ultrasound admitted for induction of labor due to Post dates. .  Subjective: Sleeping, but UCs sometimes wake her up  Objective: BP 116/74  Pulse 71  Temp(Src) 98.4 F (36.9 C) (Oral)  Resp 18  Ht  (1.702 m)  Wt 196 lb (88.905 kg)  BMI 30.69 kg/m2      FHT:  FHR: 135 bpm, variability: moderate,  accelerations:  Present,  decelerations:  Absent UC:   irregular, every 2-4 minutes SVE:   Dilation: 2 Effacement (%): 80 Station: -2 Exam by:: Artelia Laroche, CNM  Labs: Lab Results  Component Value Date   WBC 5.8 05/30/2014   HGB 11.8* 05/30/2014   HCT 35.1* 05/30/2014   MCV 84.0 05/30/2014   PLT 188 05/30/2014    Assessment / Plan: Induction of labor due to postterm,  progressing well on pitocin  Labor: Progressing normally Preeclampsia:  n/a Fetal Wellbeing:  Category I Pain Control:  Labor support without medications I/D:  n/a Anticipated MOD:  NSVD  Akshay Spang 05/30/2014, 5:08 AM

## 2014-05-30 NOTE — Progress Notes (Signed)
Sara Manning is a 28 y.o. G3P2002 at [redacted]w[redacted]d by ultrasound admitted for induction of labor due to Post dates. Due date 05/22/14.  Subjective:   Objective: BP 112/74  Pulse 70  Temp(Src) 97.8 F (36.6 C) (Oral)  Resp 18  Ht  (1.702 m)  Wt 196 lb (88.905 kg)  BMI 30.69 kg/m2      FHT:  FHR: 130 bpm, variability: moderate,  accelerations:  Present,  decelerations:  Absent UC:   regular, every 2-3 minutes SVE:   Dilation: 3 Effacement (%): 80 Station: -2 Exam by:: hk  Labs: Lab Results  Component Value Date   WBC 5.8 05/30/2014   HGB 11.8* 05/30/2014   HCT 35.1* 05/30/2014   MCV 84.0 05/30/2014   PLT 188 05/30/2014    Assessment / Plan: Induction of labor due to postterm,  progressing well on pitocin  Labor: Progressing on Pitocin, will continue to increase then AROM Preeclampsia:  no signs or symptoms of toxicity Fetal Wellbeing:  Category I Pain Control:  Labor support without medications I/D:  n/a Anticipated MOD:  NSVD  LAWSON, MARIE DARLENE 05/30/2014, 4:09 PM

## 2014-05-31 LAB — CBC
HEMATOCRIT: 29.7 % — AB (ref 36.0–46.0)
HEMOGLOBIN: 9.9 g/dL — AB (ref 12.0–15.0)
MCH: 27.9 pg (ref 26.0–34.0)
MCHC: 33.3 g/dL (ref 30.0–36.0)
MCV: 83.7 fL (ref 78.0–100.0)
Platelets: 162 10*3/uL (ref 150–400)
RBC: 3.55 MIL/uL — AB (ref 3.87–5.11)
RDW: 14.2 % (ref 11.5–15.5)
WBC: 8.4 10*3/uL (ref 4.0–10.5)

## 2014-05-31 NOTE — Progress Notes (Signed)
Post Partum Day 1 Subjective: no complaints, up ad lib, voiding, tolerating PO and + flatus  Objective: Blood pressure 110/68, pulse 66, temperature 98.5 F (36.9 C), temperature source Oral, resp. rate 18, height  (1.702 m), weight 196 lb (88.905 kg), SpO2 98.00%, unknown if currently breastfeeding.  Physical Exam:  General: alert, cooperative, appears stated age and no distress Lochia: appropriate Uterine Fundus: firm Incision: n/a DVT Evaluation: No evidence of DVT seen on physical exam. Negative Homan's sign. No cords or calf tenderness. No significant calf/ankle edema.   Recent Labs  05/30/14 0200 05/31/14 0558  HGB 11.8* 9.9*  HCT 35.1* 29.7*    Assessment/Plan: Plan for discharge tomorrow   LOS: 1 day   LAWSON, MARIE DARLENE 05/31/2014, 7:36 AM

## 2014-05-31 NOTE — Progress Notes (Signed)
Ur chart review completed.  

## 2014-05-31 NOTE — Progress Notes (Signed)
Clinical Social Work Department PSYCHOSOCIAL ASSESSMENT - MATERNAL/CHILD 05/31/2014  Patient:  Sara Manning, Sara Manning  Account Number:  [id number]  Admit Date:  05/30/2014  Ardine Eng Name:   Jah'sire   Clinical Social Worker:  Lucita Ferrara, CLINICAL SOCIAL WORKER   Date/Time:  05/31/2014 10:00 AM  Date Referred:  05/30/2014   Referral source  Central Nursery     Referred reason  Behavioral Health Issues   Other referral source:    I:  FAMILY / HOME ENVIRONMENT Child's legal guardian:  PARENT  Guardian - Name Guardian - Age Guardian - Address  Minetta Krisher [address] [place], [place] [address]  Larkin Ina  other residence   Other household support members/support persons Name Relationship DOB   DAUGHTER 27 years old   SON 27 years old   Other support:   MOB stated that there is family that lives in [place] that are supportive.  MOB stated that she has few supports in [place], but does have individuals she can contact if she needs immediate assistance with the children.    II  PSYCHOSOCIAL DATA Information Source:  Family Interview  Financial and [employer] Employment:   MOB stated that she was working in a [employer], but stopped working during her pregnancy. She stated that she is currently unemployed, but will be seeking employment in a few weeks.   Financial resources:  Medicaid If Medicaid - County:  [place] / Grade:  N/A Music therapist / Child Services Coordination / Early Interventions:   CSW to make referral for [facility]  Cultural issues impacting care:   None reported.    III  STRENGTHS Strengths  Adequate Resources  Home prepared for Child (including basic supplies)  Supportive family/friends   Strength comment:    IV  RISK FACTORS AND CURRENT PROBLEMS Current Problem:  YES   Risk Factor & Current Problem Patient Issue Family Issue Risk Factor / Current Problem Comment  Mental Illness  Y N MOB's chart documented history of bipolar since October 2012, with history of noncompliance with medication and referrals for therapy. During CSW assessment, MOB stated that she does not believe that she has ever had bipolar and that she strongly disagrees with the diagnosis that she has previously received.     V  SOCIAL WORK ASSESSMENT CSW met with MOB in her room in order to complete the assessment.  Consult ordered due to MOB presenting with a history of bipolar.  PGM, FOB, and MOB's 4 year old son present for assessment with permission of the MOB.  FOB was observed to be sleeping for entire assessment.  PGM participated when called upon, but was observed to be holding and bonding with her grandson.  MOB presented as attentive to her 27 year old and intervened appropriately.  MOB presented with a limited range in affect and in an euthymic mood.  MOB was willing to meet with CSW, but processed her feelings minimally, and displayed resistance to accepting prior bipolar disorder diagnosis.  MOB presented with being in a state of denial of the diagnosis, but she did not present with any acute symptoms as her self-reported mood symptoms that she experienced during her pregnancy were congruent with a woman who experiences discomfort and pain during a pregnancy.   MOB denied questions, concerns, or anxieties as she transitions into the postpartum period. She shared that she lives in [place] with her 2 other children (7 and 4).  MOB  stated that the FOB also lives in [place] and is supportive.  MOB shared that she has additional supports in [place], and denied any barriers or core beliefs that would negatively impact her ability/desire to utilize social supports.  MOB shared that she is currently not working since she quit her job during her pregnancy.  MOB denied any feelings of stress or of being overwhelmed with recent change in employment status as she shared belief that "it's easy for me to get a  job".  CSW attempted to explore any other potential psychosocial stressors, but she denied any other stressors.   When CSW inquired about mental health history, MOB stated, "I've been told I have bipolar, but I disagree".  CSW inquired about possible reasons why she may have been diagnosed with bipolar in 2012, MOB stated that she was unsure what symptoms may have warranted the diagnosis, but shared that she was diagnosed after "filling out a sheet".  MOB reported that she was previously prescribed medications, but she never consistently took the medication since she did not believe that they were needed. She stated that she discontinued the medication by herself, without the guidance of her MD. MOB's chart documented history of Lamictal, Seroquel, and Abilify.  Per MOB, she has not taken any medication since prior to the pregnancy.  CSW inquired about symptoms during her pregnancy.  She denied any symptoms of bipolar, and stated that she felt "fine".  She admitted to some irritability and mood swings, but shared belief that it was secondary to being pregnant as she experienced larger amounts of physical discomfort.  PGM denied any awareness of any concerning mood swings or symptoms.    MOB denied any prior history of postpartum depression.  MOB maintained eye contact and nodded as CSW provided education on postpartum depression.  MOB reported that she will seek out medical provider if she experiences symptoms.  MOB verbalized understanding of how untreated postpartum depression or mood disorders would negatively impact her ability to care for herself and her children.   No barriers to discharge at this time.   VI SOCIAL WORK PLAN Social Work Plan  Information/Referral to [employer]  No Further Intervention Required / No Barriers to Discharge   Type of pt/family education:   Postpartum depression   If child protective services report - county:   If child  protective services report - date:   Information/referral to community resources comment:   CSW inquired about desire to receive referral for mental health treatment, but MOB denied need for services at this time.   Other social work plan:   CSW to provide ongoing emotional support PRN.  Please contact CSW if needs/concerns arise.

## 2014-06-01 ENCOUNTER — Other Ambulatory Visit (HOSPITAL_COMMUNITY): Payer: Self-pay | Admitting: Family Medicine

## 2014-06-01 MED ORDER — IBUPROFEN 600 MG PO TABS: 600.0000 mg | ORAL_TABLET | Freq: Four times a day (QID) | ORAL | Status: DC

## 2014-06-01 NOTE — Telephone Encounter (Signed)
Sent in to pharmacy.  Thanks [employer]. Paulina Fusi, DO of [facility] 06/01/2014, 5:03 PM

## 2014-06-01 NOTE — Discharge Instructions (Signed)

## 2014-06-01 NOTE — Telephone Encounter (Signed)
Pt also calls for the refill on Flexeril.

## 2014-06-01 NOTE — Discharge Summary (Signed)
Obstetric Discharge Summary Reason for Admission: induction of labor Prenatal Procedures: none Intrapartum Procedures: spontaneous vaginal delivery Postpartum Procedures: none Complications-Operative and Postpartum: none  Physical Exam:  General: alert, cooperative and appears stated age 28: appropriate Uterine Fundus: firm Incision: N/A DVT Evaluation: No evidence of DVT seen on physical exam.  Discharge Diagnoses: Post-date pregnancy  Discharge Information: Date: 06/01/2014 Activity: pelvic rest Diet: routine Medications: Ibuprofen Condition: stable Instructions: refer to practice specific booklet Discharge to: home   Newborn Data: Live born female  Birth Weight: 9 lb 3.1 oz (4170 g) APGAR: 9, 9  Home with mother.  Recommend mother seek psychiatry evaluation as we have discussed in the past for her bipolar disorder.  Will address again at outpatient setting.  Stable for d/c from CSW perspective for this.   Gildardo Cranker 06/01/2014, 8:34 AM  I was consulted RE: POC and agree with above. Pt in BR when CNM rounded. Had been D/C'd when she returned.   [place], CNM 06/01/2014 4:19 PM

## 2014-06-02 NOTE — Discharge Summary (Signed)
Attestation of Attending Supervision of Advanced Practitioner (PA/CNM/NP): Evaluation and management procedures were performed by the Advanced Practitioner under my supervision and collaboration.  I have reviewed the Advanced Practitioner's note and chart, and I agree with the management and plan.  Zaynah Chawla, MD, FACOG Attending Obstetrician & Gynecologist Faculty Practice, [facility]   

## 2014-06-04 ENCOUNTER — Telehealth: Payer: Self-pay | Admitting: Family Medicine

## 2014-06-04 ENCOUNTER — Other Ambulatory Visit (HOSPITAL_COMMUNITY): Payer: Self-pay | Admitting: Family Medicine

## 2014-06-04 MED ORDER — PRENATAL MULTIVITAMIN CH: 1.0000 | ORAL_TABLET | Freq: Every day | ORAL | Status: DC

## 2014-06-04 NOTE — Telephone Encounter (Signed)
Pt called because she had a [facility] appointment yesterday and they told her that her iron was low. She would like to know can she get some iron pills called in. jw

## 2014-07-19 ENCOUNTER — Encounter (HOSPITAL_COMMUNITY): Payer: Self-pay | Admitting: *Deleted

## 2014-08-22 ENCOUNTER — Encounter (HOSPITAL_COMMUNITY): Payer: Self-pay | Admitting: *Deleted

## 2014-08-22 ENCOUNTER — Emergency Department (HOSPITAL_COMMUNITY)
Admission: EM | Admit: 2014-08-22 | Discharge: 2014-08-22 | Disposition: A | Discharge status: Home / Self Care | Payer: Medicaid Other | Attending: Emergency Medicine | Admitting: Emergency Medicine

## 2014-08-22 DIAGNOSIS — K088 Other specified disorders of teeth and supporting structures: Secondary | ICD-10-CM | POA: Insufficient documentation

## 2014-08-22 DIAGNOSIS — Z8659 Personal history of other mental and behavioral disorders: Secondary | ICD-10-CM | POA: Insufficient documentation

## 2014-08-22 DIAGNOSIS — R252 Cramp and spasm: Secondary | ICD-10-CM | POA: Diagnosis not present

## 2014-08-22 DIAGNOSIS — K029 Dental caries, unspecified: Secondary | ICD-10-CM | POA: Diagnosis not present

## 2014-08-22 DIAGNOSIS — R51 Headache: Secondary | ICD-10-CM | POA: Diagnosis not present

## 2014-08-22 DIAGNOSIS — K0889 Other specified disorders of teeth and supporting structures: Secondary | ICD-10-CM

## 2014-08-22 DIAGNOSIS — Z79899 Other long term (current) drug therapy: Secondary | ICD-10-CM | POA: Diagnosis not present

## 2014-08-22 NOTE — ED Notes (Signed)
Pt short and cursing at staff upon discharge, refusing esignature.

## 2014-08-22 NOTE — Discharge Instructions (Signed)

## 2014-08-22 NOTE — ED Notes (Signed)
EDP at bedside  

## 2014-08-22 NOTE — ED Provider Notes (Signed)
CSN: [id number]     Arrival date & time 08/22/14  0157 History   First MD Initiated Contact with Patient 08/22/14 0207     Chief Complaint  Patient presents with  . Dental Pain     (Consider location/radiation/quality/duration/timing/severity/associated sxs/prior Treatment) Patient is a 28 y.o. female presenting with tooth pain.  Dental Pain Location:  Upper Upper teeth location:  7/RU lateral incisor and 8/RU central incisor Quality:  Aching Severity:  Severe Onset quality:  Gradual Duration:  1 week Timing:  Constant Progression:  Unchanged Chronicity:  New Context comment:  Root canal at onset of pain Previous work-up:  Root canal Relieved by:  Nothing Worsened by:  Nothing tried Ineffective treatments:  None tried Associated symptoms: facial pain, headaches and trismus   Associated symptoms: no congestion and no neck pain     Past Medical History  Diagnosis Date  . Bipolar 2 disorder   . BJYNWGNF(621.3Headache(784.0)    Past Surgical History  Procedure Laterality Date  . Wisdom tooth extraction     Family History  Problem Relation Age of Onset  . Bipolar disorder Mother   . Diabetes Mother   . Bipolar disorder Sister   . Schizophrenia Sister    History  Substance Use Topics  . Smoking status: Never Smoker   . Smokeless tobacco: Never Used  . Alcohol Use: No   OB History    Gravida Para Term Preterm AB TAB SAB Ectopic Multiple Living   3 3 3  0 0 0 0 0 0 3     Review of Systems  HENT: Negative for congestion.   Musculoskeletal: Negative for neck pain.  Neurological: Positive for headaches.  All other systems reviewed and are negative.     Allergies  Review of patient's allergies indicates no known allergies.  Home Medications   Prior to Admission medications   Medication Sig Start Date End Date Taking? Authorizing Provider  cyclobenzaprine (FLEXERIL) 5 MG tablet Take 1 tablet (5 mg total) by mouth 3 (three) times daily as needed for muscle spasms. 06/01/14    Twana FirstBryan R Hess, DO  ibuprofen (ADVIL,MOTRIN) 600 MG tablet Take 1 tablet (600 mg total) by mouth every 6 (six) hours. 06/01/14   Bryan R Hess, DO  pantoprazole (PROTONIX) 40 MG tablet Take 40 mg by mouth daily as needed (indigestion).    Historical Provider, MD  Prenatal Vit-Fe Fumarate-FA (PRENATAL MULTIVITAMIN) TABS tablet Take 1 tablet by mouth daily. 06/04/14   Jamal CollinJames R Joyner, MD  promethazine (PHENERGAN) 25 MG tablet Take 25 mg by mouth every 6 (six) hours as needed for nausea or vomiting.    Historical Provider, MD  triamcinolone cream (KENALOG) 0.1 % Apply 1 application topically 2 (two) times daily as needed (for itching.).     Historical Provider, MD   BP 140/96 mmHg  Pulse 80  Temp(Src) 97.9 F (36.6 C) (Oral)  Resp 18  Ht 5\' 7"  (1.702 m)  Wt 160 lb (72.576 kg)  BMI 25.05 kg/m2  SpO2 100% Physical Exam  Constitutional: She is oriented to person, place, and time. She appears well-developed and well-nourished.  HENT:  Head: Normocephalic and atraumatic.  Right Ear: External ear normal.  Left Ear: External ear normal.  Mouth/Throat: No oral lesions. There is trismus in the jaw. Dental caries present. No dental abscesses.  Eyes: Conjunctivae and EOM are normal. Pupils are equal, round, and reactive to light.  Neck: Normal range of motion. Neck supple.  Cardiovascular: Normal rate, regular rhythm, normal heart  sounds and intact distal pulses.   Pulmonary/Chest: Effort normal and breath sounds normal.  Abdominal: Soft. Bowel sounds are normal. There is no tenderness.  Musculoskeletal: Normal range of motion.  Neurological: She is alert and oriented to person, place, and time.  Skin: Skin is warm and dry.  Vitals reviewed.   ED Course  Procedures (including critical care time) Labs Review Labs Reviewed - No data to display  Imaging Review No results found.   EKG Interpretation None      MDM   Final diagnoses:  Pain, dental    28 y.o. female with pertinent PMH of  bipolar do, recent root canal presents with dental pain as described above. On my examination the patient is well appearing, has no signs of abscess or other infection. She's been taking Tylenol 3 and another narcotic for pain. She has a mild amount of trismus, however is able to open her mouth to a sufficient degree to eat.  I discussed with the patient that something was to be expected from dental procedures and that some trismus was a common finding.  I offered her a nerve block and pain medication here which she refused.  DC home in stable condition.  1. Pain, dental         Mirian MoMatthew Tidus Up[place], MD 08/22/14 [phone]

## 2014-08-22 NOTE — ED Notes (Signed)
The pt is c/o a toothache since thursday

## 2014-08-27 ENCOUNTER — Ambulatory Visit (INDEPENDENT_AMBULATORY_CARE_PROVIDER_SITE_OTHER): Payer: Medicaid Other | Admitting: Family Medicine

## 2014-08-27 VITALS — BP 127/83 | HR 69 | Temp 98.0°F | Wt 172.3 lb

## 2014-08-27 DIAGNOSIS — F3132 Bipolar disorder, current episode depressed, moderate: Secondary | ICD-10-CM

## 2014-08-27 MED ORDER — QUETIAPINE FUMARATE 100 MG PO TABS: 100.0000 mg | ORAL_TABLET | Freq: Every day | ORAL | Status: DC

## 2014-08-27 NOTE — Patient Instructions (Addendum)
It was nice to meet you today.  Please start taking Seroquel 100 mg daily at bedtime. I like for you to go to [facility] Monday morning to be seen to discuss with a psychiatrist.  If you have any feelings of wanting to hurt herself or someone else please go to the emergency room over the weekend.  [facility] [address] [place], [place]  [id number] [phone]  Hour of operations: Monday-Friday, 8:30-5 p.m.  Take Care, Dr. B   Bipolar Disorder Bipolar disorder is a mental illness. The term bipolar disorder actually is used to describe a group of disorders that all share varying degrees of emotional highs and lows that can interfere with daily functioning, such as work, school, or relationships. Bipolar disorder also can lead to drug abuse, hospitalization, and suicide. The emotional highs of bipolar disorder are periods of elation or irritability and high energy. These highs can range from a mild form (hypomania) to a severe form (mania). People experiencing episodes of hypomania may appear energetic, excitable, and highly productive. People experiencing mania may behave impulsively or erratically. They often make poor decisions. They may have difficulty sleeping. The most severe episodes of mania can involve having very distorted beliefs or perceptions about the world and seeing or hearing things that are not real (psychotic delusions and hallucinations).  The emotional lows of bipolar disorder (depression) also can range from mild to severe. Severe episodes of bipolar depression can involve psychotic delusions and hallucinations. Sometimes people with bipolar disorder experience a state of mixed mood. Symptoms of hypomania or mania and depression are both present during this mixed-mood episode. SIGNS AND SYMPTOMS There are signs and symptoms of the episodes of hypomania and mania as well as the episodes of depression. The signs and symptoms of hypomania and  mania are similar but vary in severity. They include:  Inflated self-esteem or feeling of increased self-confidence.  Decreased need for sleep.  Unusual talkativeness (rapid or pressured speech) or the feeling of a need to keep talking.  Sensation of racing thoughts or constant talking, with quick shifts between topics that may or may not be related (flight of ideas).  Decreased ability to focus or concentrate.  Increased purposeful activity, such as work, studies, or social activity, or nonproductive activity, such as pacing, squirming and fidgeting, or finger and toe tapping.  Impulsive behavior and use of poor judgment, resulting in high-risk activities, such as having unprotected sex or spending excessive amounts of money. Signs and symptoms of depression include the following:   Feelings of sadness, hopelessness, or helplessness.  Frequent or uncontrollable episodes of crying.  Lack of feeling anything or caring about anything.  Difficulty sleeping or sleeping too much.  Inability to enjoy the things you used to enjoy.   Desire to be alone all the time.   Feelings of guilt or worthlessness.  Lack of energy or motivation.   Difficulty concentrating, remembering, or making decisions.  Change in appetite or weight beyond normal fluctuations.  Thoughts of death or the desire to harm yourself. DIAGNOSIS  Bipolar disorder is diagnosed through an assessment by your caregiver. Your caregiver will ask questions about your emotional episodes. There are two main types of bipolar disorder. People with type I bipolar disorder have manic episodes with or without depressive episodes. People with type II bipolar disorder have hypomanic episodes and major depressive episodes, which are more serious than mild depression. The type of bipolar disorder you have can make an  important difference in how your illness is monitored and treated. Your caregiver may ask questions about your  medical history and use of alcohol or drugs, including prescription medication. Certain medical conditions and substances also can cause emotional highs and lows that resemble bipolar disorder (secondary bipolar disorder).  TREATMENT  Bipolar disorder is a long-term illness. It is best controlled with continuous treatment rather than treatment only when symptoms occur. The following treatments can be prescribed for bipolar disorders:  Medication--Medication can be prescribed by a doctor that is an expert in treating mental disorders (psychiatrists). Medications called mood stabilizers are usually prescribed to help control the illness. Other medications are sometimes added if symptoms of mania, depression, or psychotic delusions and hallucinations occur despite the use of a mood stabilizer.  Talk therapy--Some forms of talk therapy are helpful in providing support, education, and guidance. A combination of medication and talk therapy is best for managing the disorder over time. A procedure in which electricity is applied to your brain through your scalp (electroconvulsive therapy) is used in cases of severe mania when medication and talk therapy do not work or work too slowly. Document Released: 12/10/2000 Document Revised: 12/29/2012 Document Reviewed: 09/29/2012 [facility] Patient Information 2015 [employer] [place]. This information is not intended to replace advice given to you by your health care provider. Make sure you discuss any questions you have with your health care provider.
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
## 2014-08-27 NOTE — Assessment & Plan Note (Signed)
Currently in a depressive episode. -  Advised hospitalization, the patient declined -  Did not feel patient was enough of a threat to others to warrant involuntary commitment -  Rx for Seroquel 100 mg daily at bedtime. Will likely need to switch to different medication in the future as patient did not like Seroquel b/c of weight gain.  Started with it because it did work for mood. -  Gave patient information  For [facility],  Submitted referral, and advised patient to go first thing Monday morning to be seen -   gavepatient warning signs,  Advised her to report to the ED if she has SI or HI -  Will schedule appointment in our clinic next week -  Asked patient for permission to talk to her mother regarding new medication and warning signs - the patient declined

## 2014-09-01 ENCOUNTER — Ambulatory Visit: Payer: Medicaid Other | Admitting: Family Medicine

## 2014-12-22 ENCOUNTER — Other Ambulatory Visit: Payer: Self-pay | Admitting: Family Medicine

## 2014-12-22 MED ORDER — QUETIAPINE FUMARATE 100 MG PO TABS: 100.0000 mg | ORAL_TABLET | Freq: Every day | ORAL | Status: DC

## 2014-12-22 MED ORDER — PRENATAL VITAMIN 27-0.8 MG PO TABS: 1.0000 | ORAL_TABLET | Freq: Every day | ORAL | Status: DC

## 2014-12-22 NOTE — Telephone Encounter (Signed)
Pt called for a refill on her Seroquel. Sent to her pharmacy in [place]. Also [facility] [address] [place] [facility] [place] since this is where she lives now. Patient has scheduled an appointment for a physical on 5/10. Jw

## 2014-12-23 ENCOUNTER — Ambulatory Visit (INDEPENDENT_AMBULATORY_CARE_PROVIDER_SITE_OTHER): Payer: Medicaid Other | Admitting: Family Medicine

## 2014-12-23 ENCOUNTER — Encounter: Payer: Self-pay | Admitting: Family Medicine

## 2014-12-23 VITALS — BP 107/75 | HR 69 | Temp 98.3°F | Ht 67.0 in | Wt 179.4 lb

## 2014-12-23 DIAGNOSIS — M549 Dorsalgia, unspecified: Secondary | ICD-10-CM | POA: Diagnosis not present

## 2014-12-23 MED ORDER — CYCLOBENZAPRINE HCL 5 MG PO TABS: 5.0000 mg | ORAL_TABLET | Freq: Three times a day (TID) | ORAL | Status: DC | PRN

## 2014-12-23 MED ORDER — NAPROXEN 500 MG PO TABS: 500.0000 mg | ORAL_TABLET | Freq: Two times a day (BID) | ORAL | Status: DC

## 2014-12-23 NOTE — Patient Instructions (Signed)
Thank you for coming in to clinic today.  1. For your back pain - I believe that this is muscle pain. Recommend muscle relaxant Flexeril 5mg  take 1 pill 3 times a day as needed, also take Naprosyn 500mg  twice daily with food, as anti-inflammatory (do NOT take any ibuprofen, advil or aleve with this medicine). You may take some Tylenol as needed 2. Recommend heating pad or moist heat for your back as well. 3. Headaches should be helped by Naprosyn as well. Please return to discuss headaches with your primary doctor in future if still persistent, may need different medicine for migraines.  Please schedule a follow-up appointment with Dr. Gayla DossJoyner in 1-3 months if still persistent  If you have any other questions or concerns, please feel free to call the clinic to contact me. You may also schedule an earlier appointment if necessary.  However, if your symptoms get significantly worse, please go to the Emergency Department to seek immediate medical attention.  Saralyn PilarAlexander Breckin Savannah, DO [facility]

## 2014-12-23 NOTE — Progress Notes (Signed)
   Subjective:    Patient ID: Sara Manning, female    DOB: [birth year], 29 y.o.   MRN: [id number]  Patient presents for a same day appointment.  HPI  RIGHT MID BACK PAIN: - Reports intermittent sharp Right mid back pain over past 2 months, lasts few seconds to minutes, seems to occur most often when getting up from bed, mostly described as positional. Denies any prior trauma, injury, fall or accident. No prior back injuries before. She has taken Ibuprofen 800mg  x 1 dose yesterday for some frontal headaches, otherwise no other medicines for her back. - Admits regular headaches (reportedly prior h/o migraine's) - Denies any fevers/chills, recent illness (coughing), weakness, numbness, tingling, leg swelling, CP, SOB  I have reviewed and updated the following as appropriate: allergies and current medications  Social Hx: Never smoke  Review of Systems  See above HPI    Objective:   Physical Exam  BP 107/75 mmHg  Pulse 69  Temp(Src) 98.3 F (36.8 C) (Oral)  Ht 5\' 7"  (1.702 m)  Wt 179 lb 6.4 oz (81.375 kg)  BMI 28.09 kg/m2  Gen - well-appearing, cooperative, NAD HEENT - MMM Neck - supple, non-tender Heart - RRR, no murmurs heard Lungs - CTAB, no wheezing, crackles, or rhonchi. Normal work of breathing. MSK - Back - non-tender over spine or paraspinal muscles. Mild +TTP over right posterior-lateral ribcage. Ext - non-tender, no edema, peripheral pulses intact +2 b/l Skin - warm, dry, no rashes     Assessment & Plan:   See specific A&P problem list for details.

## 2014-12-23 NOTE — Telephone Encounter (Signed)
Advised pt as directed below and verbalized understanding. She stated that [facility] and [facility] somewhere [facility]. She has appt today in [place] at 4 I will give a list of places for psychiatry for her to call. Keyetta Hollingworth, CMA.

## 2014-12-24 NOTE — Assessment & Plan Note (Signed)
Consistent with intermittent MSK R-sided mid-back / chestwall pain, without associated symptoms. Chronic x 2 months without inciting injury. Inadequate therapy currently without conservative therapies done at home.  Plan: 1. Refilled flexeril 5mg  TID PRN 2. Start Naprosyn 500mg  PO BID WC x 2 weeks (#30, 0 refills) as trial - strict instructions to avoid other NSAIDs while taking Naprosyn. 3. Start conservative therapies, heating pad / moist heat, stretching 4. RTC for re-evaluation within 2-4 weeks. Additionally will need to follow-up for headaches

## 2014-12-27 NOTE — Progress Notes (Signed)
I was the preceptor on the day of this visit.   Demeka Sutter MD  

## 2015-01-05 ENCOUNTER — Other Ambulatory Visit: Payer: Self-pay | Admitting: Family Medicine

## 2015-01-18 ENCOUNTER — Other Ambulatory Visit: Payer: Self-pay | Admitting: Family Medicine

## 2015-01-25 ENCOUNTER — Encounter: Payer: Medicaid Other | Admitting: Family Medicine

## 2015-02-03 ENCOUNTER — Other Ambulatory Visit: Payer: Self-pay | Admitting: Family Medicine

## 2015-02-15 ENCOUNTER — Other Ambulatory Visit: Payer: Self-pay | Admitting: Family Medicine

## 2015-03-01 ENCOUNTER — Ambulatory Visit: Payer: Medicaid Other | Admitting: Family Medicine

## 2015-03-23 ENCOUNTER — Other Ambulatory Visit: Payer: Self-pay | Admitting: Family Medicine

## 2015-03-23 DIAGNOSIS — F319 Bipolar disorder, unspecified: Secondary | ICD-10-CM

## 2015-03-23 NOTE — Telephone Encounter (Signed)
Refill request from pharmacy. Will forward to MD covering for PCP and PCP for review. Earsel Shouse, CMA.

## 2015-03-23 NOTE — Telephone Encounter (Signed)
Spoke with patient about refill request. She is continuing to take seroquel. She would like to change medications as it has made her gain weight from an increase in her appetite. I advised to f/u.    Will send refill.   Myra RudeJeremy E Schmitz, MD PGY-3, [facility] 03/23/2015, 5:25 PM

## 2015-04-16 ENCOUNTER — Other Ambulatory Visit: Payer: Self-pay | Admitting: Family Medicine

## 2015-07-23 IMAGING — US US OB COMP LESS 14 WK
1 series · 14 of 28 positions shown · non-contrast
Comparison: None.

CLINICAL DATA: Unsure of LMP. Primary amenorrhea. Primary
amenorrhea.

EXAM:
OBSTETRIC <14 WK ULTRASOUND
TECHNIQUE: Transabdominal ultrasound was performed for evaluation of the
gestation as well as the maternal uterus and adnexal regions.

[Series 1: us ob comp less 14 wks · 14 of 30 slices shown]
[im 2/30]
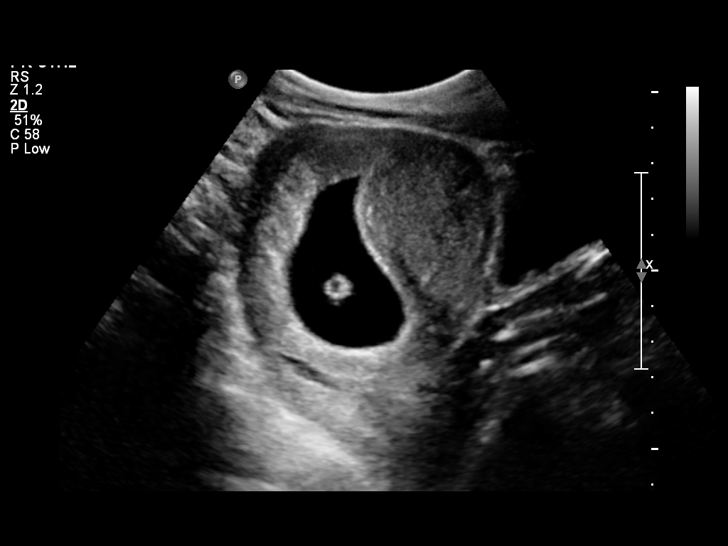
[im 4/30]
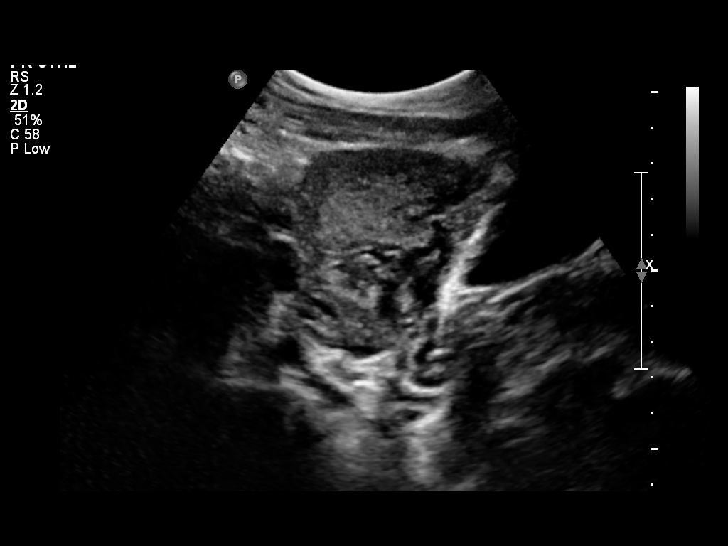
[im 6/30]
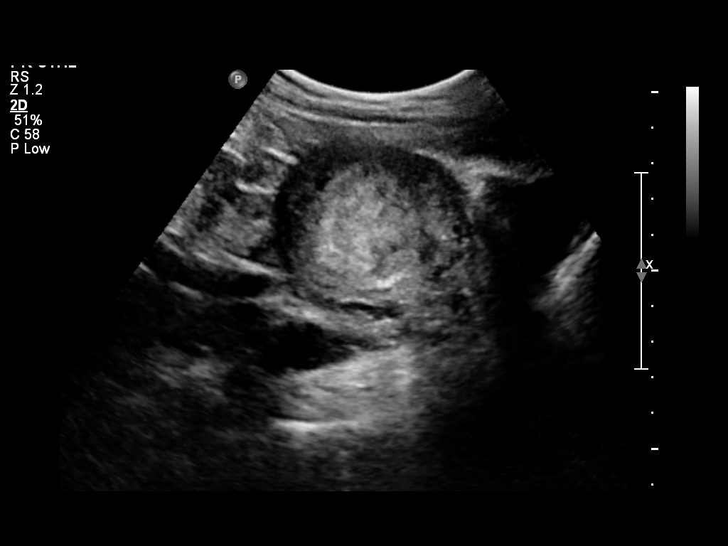
[im 8/30]
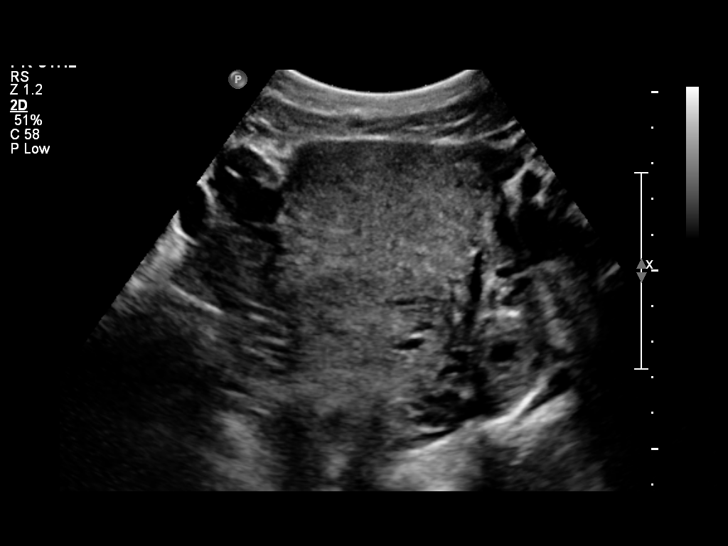
[im 10/30]
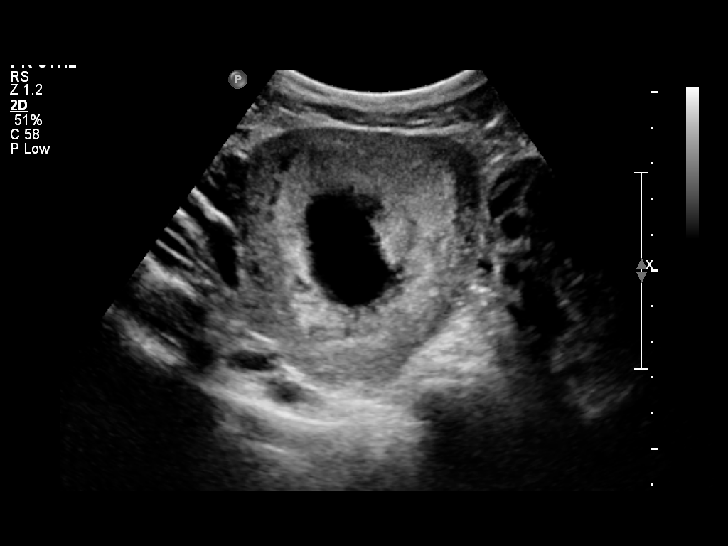
[im 12/30]
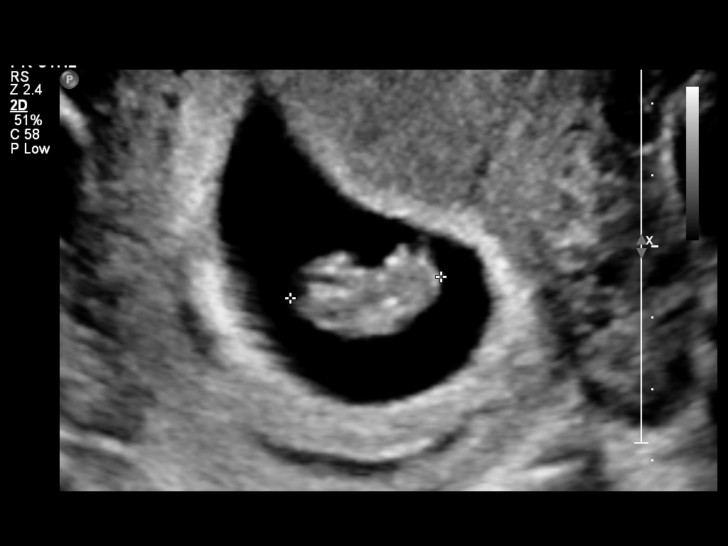
[im 14/30]
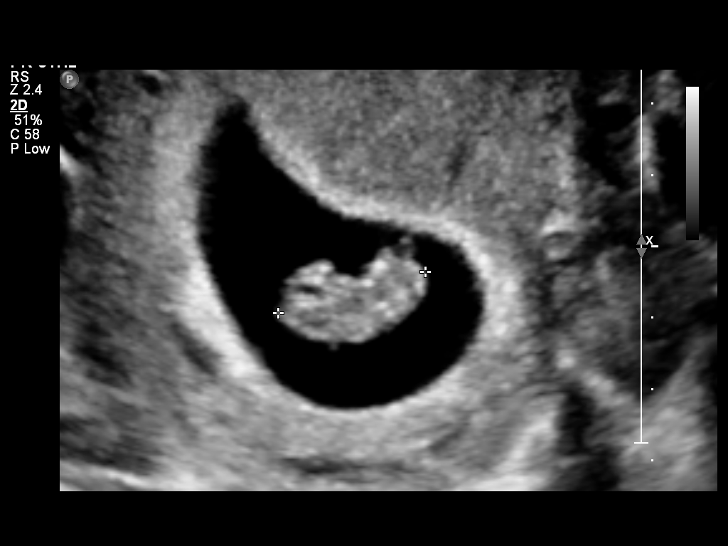
[im 17/30]
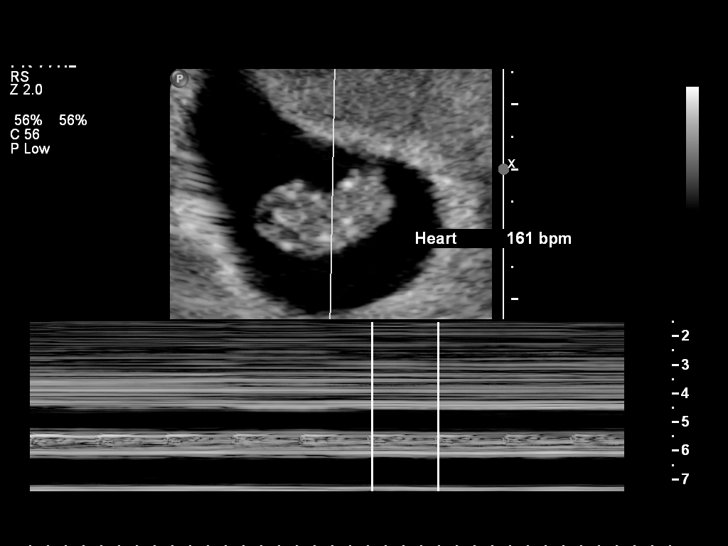
[im 19/30]
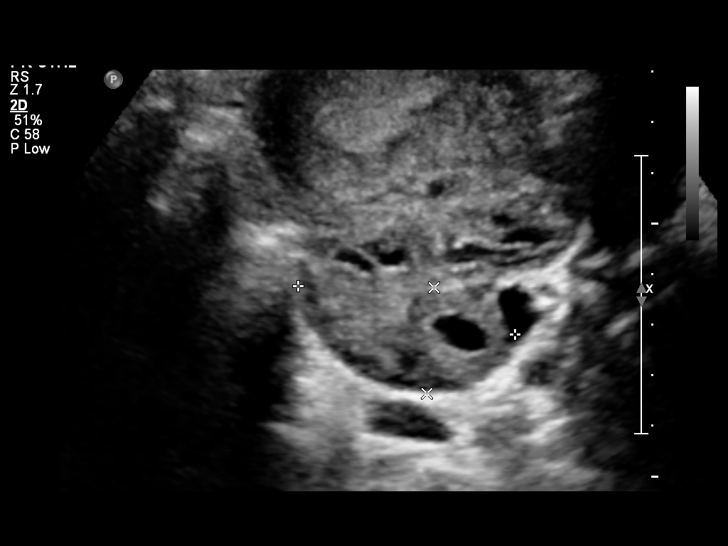
[im 21/30]
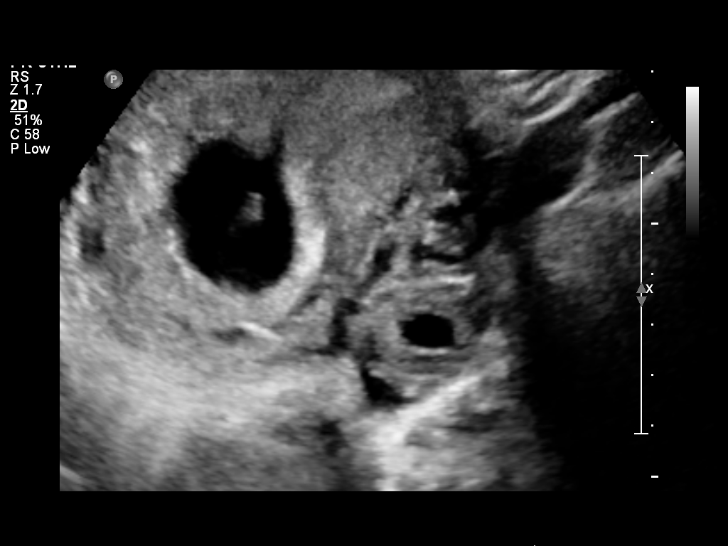
[im 23/30]
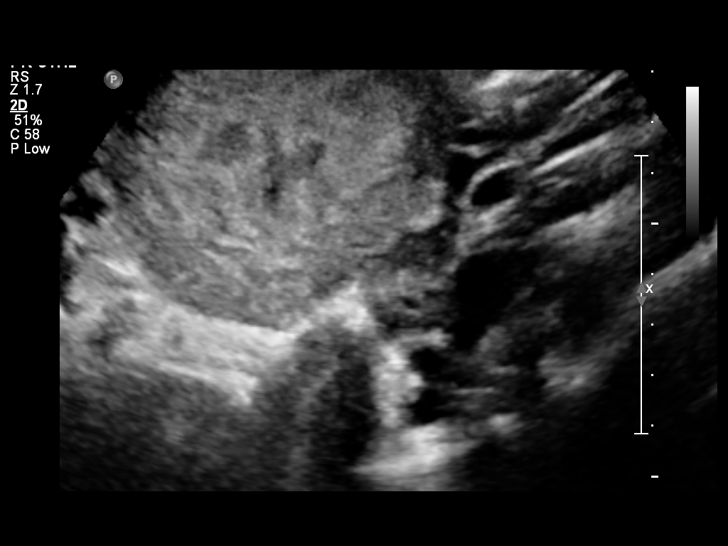
[im 25/30]
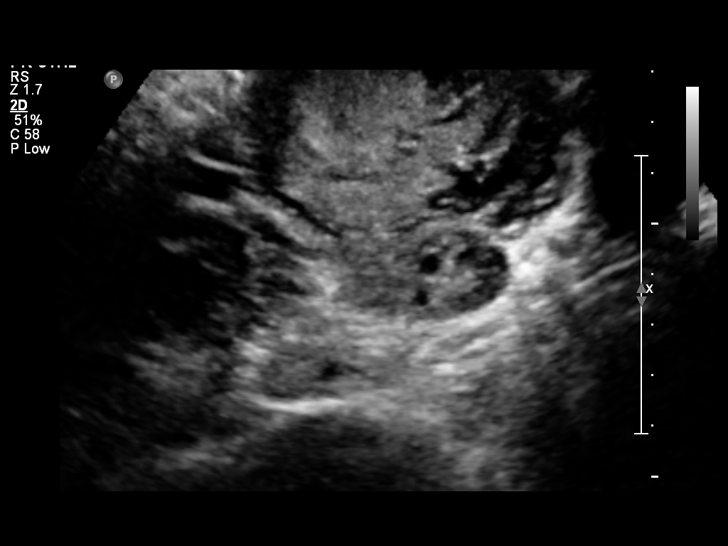
[im 27/30]
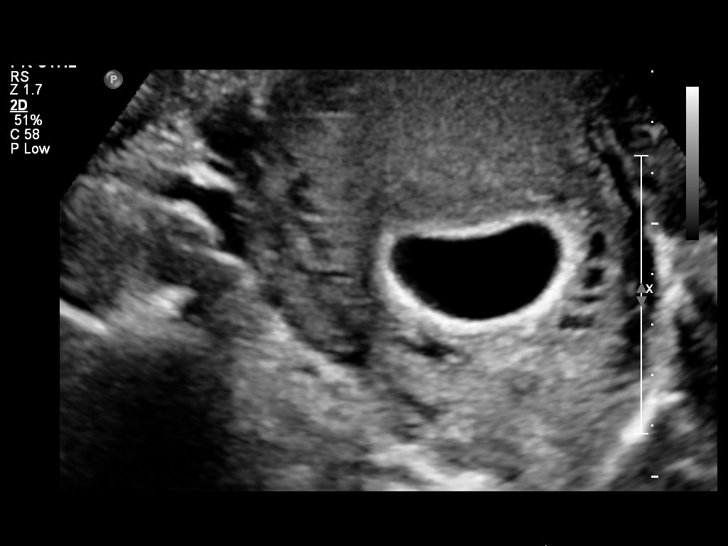
[im 30/30]
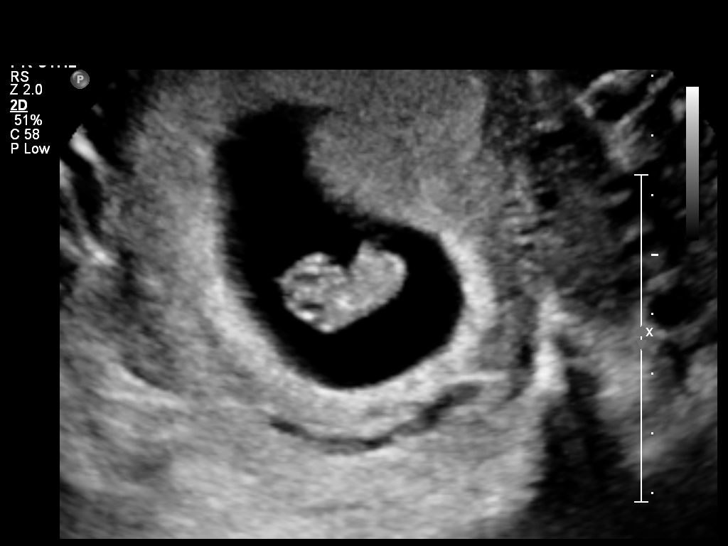

[14 of 28 positions shown; findings below may reference images not displayed]

FINDINGS: Intrauterine gestational sac: Visualized/normal in shape.

Yolk sac:  Visualized

Embryo:  Visualized

Cardiac Activity: Visualized

Heart Rate: 161 bpm

CRL:   21  mm   8 w 6 d                  US EDC: 05/22/2014

Maternal uterus/adnexae: No mass or free fluid visualized. Normal
appearance of both ovaries.
IMPRESSION: Single living IUP measuring 8 weeks 6 days with US EDC of
05/22/2014.

No significant maternal uterine or adnexal abnormality identified.

## 2015-07-24 ENCOUNTER — Encounter (HOSPITAL_COMMUNITY): Payer: Self-pay | Admitting: Emergency Medicine

## 2015-07-24 DIAGNOSIS — S0990XA Unspecified injury of head, initial encounter: Secondary | ICD-10-CM | POA: Diagnosis present

## 2015-07-24 DIAGNOSIS — Z79899 Other long term (current) drug therapy: Secondary | ICD-10-CM | POA: Insufficient documentation

## 2015-07-24 DIAGNOSIS — Y92481 Parking lot as the place of occurrence of the external cause: Secondary | ICD-10-CM | POA: Diagnosis not present

## 2015-07-24 DIAGNOSIS — Y998 Other external cause status: Secondary | ICD-10-CM | POA: Insufficient documentation

## 2015-07-24 DIAGNOSIS — F319 Bipolar disorder, unspecified: Secondary | ICD-10-CM | POA: Diagnosis not present

## 2015-07-24 DIAGNOSIS — Y9389 Activity, other specified: Secondary | ICD-10-CM | POA: Insufficient documentation

## 2015-07-24 DIAGNOSIS — Z8679 Personal history of other diseases of the circulatory system: Secondary | ICD-10-CM | POA: Insufficient documentation

## 2015-07-24 MED ORDER — IBUPROFEN 400 MG PO TABS
600.0000 mg | ORAL_TABLET | Freq: Once | ORAL | Status: AC
  Administered 2015-07-24: 600 mg via ORAL
  Filled 2015-07-24 (×2): qty 1
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
## 2015-07-25 ENCOUNTER — Emergency Department (HOSPITAL_COMMUNITY)
Admission: EM | Admit: 2015-07-25 | Discharge: 2015-07-25 | Disposition: A | Discharge status: Home / Self Care | Payer: No Typology Code available for payment source | Attending: Emergency Medicine | Admitting: Emergency Medicine

## 2015-07-25 DIAGNOSIS — R519 Headache, unspecified: Secondary | ICD-10-CM

## 2015-07-25 DIAGNOSIS — R51 Headache: Secondary | ICD-10-CM

## 2015-07-25 DIAGNOSIS — M549 Dorsalgia, unspecified: Secondary | ICD-10-CM

## 2015-07-25 MED ORDER — CYCLOBENZAPRINE HCL 5 MG PO TABS: 5.0000 mg | ORAL_TABLET | Freq: Two times a day (BID) | ORAL | Status: DC | PRN

## 2015-07-25 MED ORDER — NAPROXEN 500 MG PO TABS: 500.0000 mg | ORAL_TABLET | Freq: Two times a day (BID) | ORAL | Status: DC | PRN

## 2015-07-25 MED ORDER — CYCLOBENZAPRINE HCL 10 MG PO TABS
5.0000 mg | ORAL_TABLET | Freq: Once | ORAL | Status: DC
  Filled 2015-07-25: qty 1

## 2015-07-25 MED ORDER — CYCLOBENZAPRINE HCL 10 MG PO TABS
10.0000 mg | ORAL_TABLET | Freq: Once | ORAL | Status: AC
  Administered 2015-07-25: 10 mg via ORAL

## 2015-07-25 MED ORDER — OXYCODONE-ACETAMINOPHEN 5-325 MG PO TABS
1.0000 | ORAL_TABLET | Freq: Once | ORAL | Status: AC
  Administered 2015-07-25: 1 via ORAL
  Filled 2015-07-25: qty 1

## 2015-07-25 MED ORDER — KETOROLAC TROMETHAMINE 15 MG/ML IJ SOLN: 15.0000 mg | Freq: Once | INTRAMUSCULAR | Status: DC

## 2015-07-25 NOTE — ED Notes (Signed)
Pt left at this time with all belongings.  

## 2015-07-25 NOTE — Discharge Instructions (Signed)
Ms. Sara Manning,  Nice meeting you! Please return to the emergency department if you develop confusion, have nausea or vomiting, or lose control of your bowel or bladder. Feel better soon!  S. Lane HackerNicole Aymee Fomby, PA-C

## 2015-07-27 ENCOUNTER — Encounter: Payer: Self-pay | Admitting: Family Medicine

## 2015-07-27 ENCOUNTER — Ambulatory Visit (INDEPENDENT_AMBULATORY_CARE_PROVIDER_SITE_OTHER): Payer: Medicaid Other | Admitting: Family Medicine

## 2015-07-27 VITALS — BP 111/72 | HR 87 | Temp 97.9°F | Ht 66.0 in | Wt 174.0 lb

## 2015-07-27 DIAGNOSIS — R519 Headache, unspecified: Secondary | ICD-10-CM

## 2015-07-27 DIAGNOSIS — R51 Headache: Secondary | ICD-10-CM | POA: Diagnosis not present

## 2015-07-27 MED ORDER — KETOROLAC TROMETHAMINE 60 MG/2ML IM SOLN
60.0000 mg | Freq: Once | INTRAMUSCULAR | Status: AC
  Administered 2015-07-27: 60 mg via INTRAMUSCULAR
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
## 2015-07-27 NOTE — Patient Instructions (Signed)
Good to see you today!  Thanks for coming in.  Use the naprosyn twice daily along with flexaril and benadryl as needed  If the pain becomes a lot worse or you have visual change or weakness see us immediately  You should be better in 2 days but the pain can last up to 7-10 days

## 2015-08-17 ENCOUNTER — Telehealth: Payer: Self-pay | Admitting: Family Medicine
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
## 2015-08-18 NOTE — Telephone Encounter (Signed)
Spoke with patient and she will come pick up letter. Jazmin Hartsell,CMA

## 2015-08-18 NOTE — Telephone Encounter (Signed)
I wrote letter and gave to blue team

## 2015-09-05 ENCOUNTER — Ambulatory Visit: Payer: Medicaid Other | Admitting: Family Medicine

## 2015-09-06 ENCOUNTER — Ambulatory Visit: Payer: Medicaid Other | Admitting: Family Medicine

## 2015-10-10 ENCOUNTER — Encounter: Payer: Self-pay | Admitting: Family Medicine

## 2015-10-10 ENCOUNTER — Ambulatory Visit (INDEPENDENT_AMBULATORY_CARE_PROVIDER_SITE_OTHER): Payer: Medicaid Other | Admitting: Family Medicine

## 2015-10-10 VITALS — BP 112/64 | HR 97 | Wt 173.0 lb

## 2015-10-10 DIAGNOSIS — R51 Headache: Secondary | ICD-10-CM

## 2015-10-10 DIAGNOSIS — R519 Headache, unspecified: Secondary | ICD-10-CM | POA: Insufficient documentation

## 2015-10-10 MED ORDER — SUMATRIPTAN SUCCINATE 50 MG PO TABS: ORAL_TABLET | ORAL | Status: DC
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
## 2015-10-10 NOTE — Patient Instructions (Addendum)
Come back to the clinic sometime in the next month to discuss your headaches. It will be best if you start recording in a diary all of your headaches:  When the headache started, how long it lasted, what you took to try and help. Any other symptoms or things you think helped or made it worse  [facility]    [phone] [address] [place] [address] No se habla espaol From birth Parent may not go with child  Marolyn Hammock DMD    [phone] [address] [place] [address] Se habla espaol [place] spoken From 61 years old Parent may go with child  J. Timberlake DDS    [phone] Garlon Hatchet DDS [address] [place] [address] Se habla espaol From 82 year old Parent may go with child

## 2015-10-10 NOTE — Assessment & Plan Note (Signed)
Sounds like she may be describing migraine headache, no past diagnosis of this. Offered migraine cocktail in clinic but she declined and preferred trial of imitrex. Headache hygiene (keep hydrated, regular sleep). Asked patient to record her headaches in a diary and bring with her to a follow up appointment within next 1 month.

## 2015-12-01 ENCOUNTER — Ambulatory Visit (INDEPENDENT_AMBULATORY_CARE_PROVIDER_SITE_OTHER): Payer: Medicaid Other | Admitting: Family Medicine

## 2015-12-01 ENCOUNTER — Encounter: Payer: Self-pay | Admitting: Family Medicine

## 2015-12-01 VITALS — BP 118/73 | HR 76 | Temp 98.2°F | Ht 66.0 in | Wt 174.0 lb

## 2015-12-01 DIAGNOSIS — R51 Headache: Secondary | ICD-10-CM | POA: Diagnosis not present

## 2015-12-01 DIAGNOSIS — L309 Dermatitis, unspecified: Secondary | ICD-10-CM

## 2015-12-01 DIAGNOSIS — R519 Headache, unspecified: Secondary | ICD-10-CM

## 2015-12-01 MED ORDER — TRIAMCINOLONE ACETONIDE 0.1 % EX CREA: 1.0000 "application " | TOPICAL_CREAM | Freq: Two times a day (BID) | CUTANEOUS | Status: DC | PRN

## 2015-12-01 NOTE — Assessment & Plan Note (Signed)
Symptoms consistent with tension headache versus chronic daily headache.  Again, reinforced the need for headache diary as previously recommended.  Headache, possibly caused by poor hydration (drinks plus one glass of water daily); Reports not going to the bathroom 1-2 times daily versus poor sleep ( has to take naps after dropping during the week, and then sleeps in on the weekends) versus possible TMJ ( grinds teeth nightly)  - Recommended Headache diary for the next 2 weeks - Recommended checking 3-4 bottles of water daily and decreasing soda consumption - Recommended working on sleep hygiene - Continue Tylenol 1000 mg every 8 hours or Aleve twice a day as needed; recommended against taking medications more than 10-12 days a month - Follow-up in 2 weeks for reassessment

## 2015-12-01 NOTE — Patient Instructions (Signed)
Your headaches seem consistent with chronic daily headache.  The most important thing for treating this is improving sleep, hydration.  - Keep a diary of your headaches, sleep and water intake for the next 2-3 weeks and return to clinic - He can take Tylenol 1000 mg every 8 hours for Aleve 2 pills twice a day as needed headaches.  - Avoid using medications more than 10-15 days a month  General Headache Without Cause A headache is pain or discomfort felt around the head or neck area. The specific cause of a headache may not be found. There are many causes and types of headaches. A few common ones are:  Tension headaches.  Migraine headaches.  Cluster headaches.  Chronic daily headaches. HOME CARE INSTRUCTIONS  Watch your condition for any changes. Take these steps to help with your condition: Managing Pain  Take over-the-counter and prescription medicines only as told by your health care provider.  Lie down in a dark, quiet room when you have a headache.  If directed, apply ice to the head and neck area:  Put ice in a plastic bag.  Place a towel between your skin and the bag.  Leave the ice on for 20 minutes, 2-3 times per day.  Use a heating pad or hot shower to apply heat to the head and neck area as told by your health care provider.  Keep lights dim if bright lights bother you or make your headaches worse. Eating and Drinking  Eat meals on a regular schedule.  Limit alcohol use.  Decrease the amount of caffeine you drink, or stop drinking caffeine. General Instructions  Keep all follow-up visits as told by your health care provider. This is important.  Keep a headache journal to help find out what may trigger your headaches. For example, write down:  What you eat and drink.  How much sleep you get.  Any change to your diet or medicines.  Try massage or other relaxation techniques.  Limit stress.  Sit up straight, and do not tense your muscles.  Do not use  tobacco products, including cigarettes, chewing tobacco, or e-cigarettes. If you need help quitting, ask your health care provider.  Exercise regularly as told by your health care provider.  Sleep on a regular schedule. Get 7-9 hours of sleep, or the amount recommended by your health care provider. SEEK MEDICAL CARE IF:   Your symptoms are not helped by medicine.  You have a headache that is different from the usual headache.  You have nausea or you vomit.  You have a fever. SEEK IMMEDIATE MEDICAL CARE IF:   Your headache becomes severe.  You have repeated vomiting.  You have a stiff neck.  You have a loss of vision.  You have problems with speech.  You have pain in the eye or ear.  You have muscular weakness or loss of muscle control.  You lose your balance or have trouble walking.  You feel faint or pass out.  You have confusion.   This information is not intended to replace advice given to you by your health care provider. Make sure you discuss any questions you have with your health care provider.   Document Released: 09/03/2005 Document Revised: 05/25/2015 Document Reviewed: 12/27/2014 Elsevier Interactive Patient Education [employer]2016 [employer]
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
## 2015-12-01 NOTE — Assessment & Plan Note (Signed)
Recommended against hot showers and scratching.  - Recommended moisturization with Vaseline to 3 times daily - Refill triamcinolone twice a day 1-2 weeks

## 2016-01-23 ENCOUNTER — Emergency Department (HOSPITAL_COMMUNITY): Payer: Medicaid Other

## 2016-01-23 ENCOUNTER — Emergency Department (HOSPITAL_COMMUNITY)
Admission: EM | Admit: 2016-01-23 | Discharge: 2016-01-23 | Disposition: A | Discharge status: Home / Self Care | Payer: Medicaid Other | Attending: Emergency Medicine | Admitting: Emergency Medicine

## 2016-01-23 ENCOUNTER — Encounter (HOSPITAL_COMMUNITY): Payer: Self-pay | Admitting: *Deleted

## 2016-01-23 DIAGNOSIS — Y9389 Activity, other specified: Secondary | ICD-10-CM | POA: Insufficient documentation

## 2016-01-23 DIAGNOSIS — Y92410 Unspecified street and highway as the place of occurrence of the external cause: Secondary | ICD-10-CM | POA: Diagnosis not present

## 2016-01-23 DIAGNOSIS — S29002A Unspecified injury of muscle and tendon of back wall of thorax, initial encounter: Secondary | ICD-10-CM | POA: Insufficient documentation

## 2016-01-23 DIAGNOSIS — S199XXA Unspecified injury of neck, initial encounter: Secondary | ICD-10-CM | POA: Diagnosis not present

## 2016-01-23 DIAGNOSIS — Z8659 Personal history of other mental and behavioral disorders: Secondary | ICD-10-CM | POA: Insufficient documentation

## 2016-01-23 DIAGNOSIS — S0990XA Unspecified injury of head, initial encounter: Secondary | ICD-10-CM | POA: Diagnosis not present

## 2016-01-23 DIAGNOSIS — S4991XA Unspecified injury of right shoulder and upper arm, initial encounter: Secondary | ICD-10-CM | POA: Diagnosis present

## 2016-01-23 DIAGNOSIS — Y998 Other external cause status: Secondary | ICD-10-CM | POA: Diagnosis not present

## 2016-01-23 MED ORDER — METHOCARBAMOL 500 MG PO TABS
500.0000 mg | ORAL_TABLET | Freq: Once | ORAL | Status: AC
  Administered 2016-01-23: 500 mg via ORAL
  Filled 2016-01-23: qty 1

## 2016-01-23 MED ORDER — IBUPROFEN 800 MG PO TABS: 800.0000 mg | ORAL_TABLET | Freq: Three times a day (TID) | ORAL | Status: DC

## 2016-01-23 MED ORDER — METHOCARBAMOL 500 MG PO TABS: 500.0000 mg | ORAL_TABLET | Freq: Two times a day (BID) | ORAL | Status: DC

## 2016-01-23 MED ORDER — IBUPROFEN 400 MG PO TABS
800.0000 mg | ORAL_TABLET | Freq: Once | ORAL | Status: AC
Start: 2016-01-23 — End: 2016-01-23
  Administered 2016-01-23: 800 mg via ORAL
  Filled 2016-01-23: qty 2

## 2016-01-23 NOTE — Discharge Instructions (Signed)

## 2016-01-23 NOTE — ED Notes (Signed)
Patient given ice water upon request.

## 2016-01-23 NOTE — ED Notes (Signed)
Declined W/C at D/C and was escorted to lobby by RN. 
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
## 2016-01-30 ENCOUNTER — Encounter (HOSPITAL_COMMUNITY): Payer: Self-pay | Admitting: Nurse Practitioner

## 2016-01-30 ENCOUNTER — Ambulatory Visit (INDEPENDENT_AMBULATORY_CARE_PROVIDER_SITE_OTHER)
Admission: EM | Admit: 2016-01-30 | Discharge: 2016-01-30 | Disposition: A | Discharge status: Home / Self Care | Payer: No Typology Code available for payment source | Source: Home / Self Care | Attending: Family Medicine | Admitting: Family Medicine

## 2016-01-30 ENCOUNTER — Emergency Department (HOSPITAL_COMMUNITY)
Admission: EM | Admit: 2016-01-30 | Discharge: 2016-01-30 | Disposition: A | Discharge status: Home / Self Care | Payer: No Typology Code available for payment source | Attending: Emergency Medicine | Admitting: Emergency Medicine

## 2016-01-30 ENCOUNTER — Emergency Department (HOSPITAL_COMMUNITY): Payer: No Typology Code available for payment source

## 2016-01-30 ENCOUNTER — Encounter (HOSPITAL_COMMUNITY): Payer: Self-pay | Admitting: *Deleted

## 2016-01-30 DIAGNOSIS — Z8659 Personal history of other mental and behavioral disorders: Secondary | ICD-10-CM | POA: Diagnosis not present

## 2016-01-30 DIAGNOSIS — Y9389 Activity, other specified: Secondary | ICD-10-CM | POA: Diagnosis not present

## 2016-01-30 DIAGNOSIS — M549 Dorsalgia, unspecified: Secondary | ICD-10-CM

## 2016-01-30 DIAGNOSIS — S29002A Unspecified injury of muscle and tendon of back wall of thorax, initial encounter: Secondary | ICD-10-CM | POA: Insufficient documentation

## 2016-01-30 DIAGNOSIS — Y92410 Unspecified street and highway as the place of occurrence of the external cause: Secondary | ICD-10-CM | POA: Diagnosis not present

## 2016-01-30 DIAGNOSIS — M546 Pain in thoracic spine: Secondary | ICD-10-CM

## 2016-01-30 DIAGNOSIS — R202 Paresthesia of skin: Secondary | ICD-10-CM | POA: Diagnosis not present

## 2016-01-30 DIAGNOSIS — S0990XA Unspecified injury of head, initial encounter: Secondary | ICD-10-CM | POA: Insufficient documentation

## 2016-01-30 DIAGNOSIS — Y998 Other external cause status: Secondary | ICD-10-CM | POA: Insufficient documentation

## 2016-01-30 DIAGNOSIS — R51 Headache: Secondary | ICD-10-CM

## 2016-01-30 DIAGNOSIS — G44309 Post-traumatic headache, unspecified, not intractable: Secondary | ICD-10-CM

## 2016-01-30 LAB — CBC WITH DIFFERENTIAL/PLATELET
Basophils Absolute: 0 10*3/uL (ref 0.0–0.1)
Basophils Relative: 1 %
Eosinophils Absolute: 0.1 10*3/uL (ref 0.0–0.7)
Eosinophils Relative: 3 %
HCT: 39.2 % (ref 36.0–46.0)
Hemoglobin: 12.7 g/dL (ref 12.0–15.0)
Lymphocytes Relative: 39 %
Lymphs Abs: 1.7 10*3/uL (ref 0.7–4.0)
MCH: 27.8 pg (ref 26.0–34.0)
MCHC: 32.4 g/dL (ref 30.0–36.0)
MCV: 85.8 fL (ref 78.0–100.0)
Monocytes Absolute: 0.4 10*3/uL (ref 0.1–1.0)
Monocytes Relative: 8 %
Neutro Abs: 2.2 10*3/uL (ref 1.7–7.7)
Neutrophils Relative %: 49 %
Platelets: 281 10*3/uL (ref 150–400)
RBC: 4.57 MIL/uL (ref 3.87–5.11)
RDW: 13 % (ref 11.5–15.5)
WBC: 4.4 10*3/uL (ref 4.0–10.5)

## 2016-01-30 LAB — COMPREHENSIVE METABOLIC PANEL
ALT: 44 U/L (ref 14–54)
AST: 30 U/L (ref 15–41)
Albumin: 3.5 g/dL (ref 3.5–5.0)
Alkaline Phosphatase: 76 U/L (ref 38–126)
Anion gap: 6 (ref 5–15)
BUN: 10 mg/dL (ref 6–20)
CO2: 27 mmol/L (ref 22–32)
Calcium: 8.8 mg/dL — ABNORMAL LOW (ref 8.9–10.3)
Chloride: 107 mmol/L (ref 101–111)
Creatinine, Ser: 0.82 mg/dL (ref 0.44–1.00)
GFR calc Af Amer: >60 mL/min (ref >60)
GFR calc non Af Amer: >60 mL/min (ref >60)
Glucose, Bld: 87 mg/dL (ref 65–99)
Potassium: 4 mmol/L (ref 3.5–5.1)
Sodium: 140 mmol/L (ref 135–145)
Total Bilirubin: 0.3 mg/dL (ref 0.3–1.2)
Total Protein: 6.9 g/dL (ref 6.5–8.1)

## 2016-01-30 MED ORDER — KETOROLAC TROMETHAMINE 30 MG/ML IJ SOLN
30.0000 mg | Freq: Once | INTRAMUSCULAR | Status: AC
  Administered 2016-01-30: 30 mg via INTRAVENOUS
  Filled 2016-01-30: qty 1

## 2016-01-30 MED ORDER — PROCHLORPERAZINE EDISYLATE 5 MG/ML IJ SOLN
5.0000 mg | Freq: Once | INTRAMUSCULAR | Status: AC
  Administered 2016-01-30: 5 mg via INTRAVENOUS
  Filled 2016-01-30: qty 2

## 2016-01-30 MED ORDER — DIPHENHYDRAMINE HCL 50 MG/ML IJ SOLN
25.0000 mg | Freq: Once | INTRAMUSCULAR | Status: AC
  Administered 2016-01-30: 25 mg via INTRAVENOUS
  Filled 2016-01-30: qty 1
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
## 2016-01-30 NOTE — Discharge Instructions (Signed)
Treatment: Continue to take the medications given last week. You may also try Excedrin migraine for your headaches. It is recommended that you reduce your screen time (phone, TV, tablets, computers) to 15-20 minutes daily. Use moist heat on your back and neck 3-4 times daily alternating 20 minutes on, 20 minutes off. Stretch your back as tolerated with the exercises given on discharge paperwork below.  Follow-up: Please follow-up with Dr. Eulah Pont for further evaluation and treatment of your back pain and arm tingling. Please follow-up with your primary care provider for follow-up of today's visit and further evaluation and treatment of your symptoms. Please return to emergency department if you develop any new or worsening symptoms.   Back Pain, Adult Back pain is very common in adults.The cause of back pain is rarely dangerous and the pain often gets better over time.The cause of your back pain may not be known. Some common causes of back pain include:  Strain of the muscles or ligaments supporting the spine.  Wear and tear (degeneration) of the spinal disks.  Arthritis.  Direct injury to the back. For many people, back pain may return. Since back pain is rarely dangerous, most people can learn to manage this condition on their own. HOME CARE INSTRUCTIONS Watch your back pain for any changes. The following actions may help to lessen any discomfort you are feeling:  Remain active. It is stressful on your back to sit or stand in one place for long periods of time. Do not sit, drive, or stand in one place for more than 30 minutes at a time. Take short walks on even surfaces as soon as you are able.Try to increase the length of time you walk each day.  Exercise regularly as directed by your health care provider. Exercise helps your back heal faster. It also helps avoid future injury by keeping your muscles strong and flexible.  Do not stay in bed.Resting more than 1-2 days can delay your  recovery.  Pay attention to your body when you bend and lift. The most comfortable positions are those that put less stress on your recovering back. Always use proper lifting techniques, including:  Bending your knees.  Keeping the load close to your body.  Avoiding twisting.  Find a comfortable position to sleep. Use a firm mattress and lie on your side with your knees slightly bent. If you lie on your back, put a pillow under your knees.  Avoid feeling anxious or stressed.Stress increases muscle tension and can worsen back pain.It is important to recognize when you are anxious or stressed and learn ways to manage it, such as with exercise.  Take medicines only as directed by your health care provider. Over-the-counter medicines to reduce pain and inflammation are often the most helpful.Your health care provider may prescribe muscle relaxant drugs.These medicines help dull your pain so you can more quickly return to your normal activities and healthy exercise.  Apply ice to the injured area:  Put ice in a plastic bag.  Place a towel between your skin and the bag.  Leave the ice on for 20 minutes, 2-3 times a day for the first 2-3 days. After that, ice and heat may be alternated to reduce pain and spasms.  Maintain a healthy weight. Excess weight puts extra stress on your back and makes it difficult to maintain good posture. SEEK MEDICAL CARE IF:  You have pain that is not relieved with rest or medicine.  You have increasing pain going down into the  legs or buttocks.  You have pain that does not improve in one week.  You have night pain.  You lose weight.  You have a fever or chills. SEEK IMMEDIATE MEDICAL CARE IF:   You develop new bowel or bladder control problems.  You have unusual weakness or numbness in your arms or legs.  You develop nausea or vomiting.  You develop abdominal pain.  You feel faint.   This information is not intended to replace advice given  to you by your health care provider. Make sure you discuss any questions you have with your health care provider.   Document Released: 09/03/2005 Document Revised: 09/24/2014 Document Reviewed: 01/05/2014 Elsevier Interactive Patient Education 2016 [employer] Inc[employer]  Back Exercises If you have pain in your back, do these exercises 2-3 times each day or as told by your doctor. When the pain goes away, do the exercises once each day, but repeat the steps more times for each exercise (do more repetitions). If you do not have pain in your back, do these exercises once each day or as told by your doctor. EXERCISES Single Knee to Chest Do these steps 3-5 times in a row for each leg:  Lie on your back on a firm bed or the floor with your legs stretched out.  Bring one knee to your chest.  Hold your knee to your chest by grabbing your knee or thigh.  Pull on your knee until you feel a gentle stretch in your lower back.  Keep doing the stretch for 10-30 seconds.  Slowly let go of your leg and straighten it. Pelvic Tilt Do these steps 5-10 times in a row:  Lie on your back on a firm bed or the floor with your legs stretched out.  Bend your knees so they point up to the ceiling. Your feet should be flat on the floor.  Tighten your lower belly (abdomen) muscles to press your lower back against the floor. This will make your tailbone point up to the ceiling instead of pointing down to your feet or the floor.  Stay in this position for 5-10 seconds while you gently tighten your muscles and breathe evenly. Cat-Cow Do these steps until your lower back bends more easily:  Get on your hands and knees on a firm surface. Keep your hands under your shoulders, and keep your knees under your hips. You may put padding under your knees.  Let your head hang down, and make your tailbone point down to the floor so your lower back is round like the back of a cat.  Stay in this position for 5  seconds.  Slowly lift your head and make your tailbone point up to the ceiling so your back hangs low (sags) like the back of a cow.  Stay in this position for 5 seconds. Press-Ups Do these steps 5-10 times in a row:  Lie on your belly (face-down) on the floor.  Place your hands near your head, about shoulder-width apart.  While you keep your back relaxed and keep your hips on the floor, slowly straighten your arms to raise the top half of your body and lift your shoulders. Do not use your back muscles. To make yourself more comfortable, you may change where you place your hands.  Stay in this position for 5 seconds.  Slowly return to lying flat on the floor. Bridges Do these steps 10 times in a row:  Lie on your back on a firm surface.  Bend your  knees so they point up to the ceiling. Your feet should be flat on the floor.  Tighten your butt muscles and lift your butt off of the floor until your waist is almost as high as your knees. If you do not feel the muscles working in your butt and the back of your thighs, slide your feet 1-2 inches farther away from your butt.  Stay in this position for 3-5 seconds.  Slowly lower your butt to the floor, and let your butt muscles relax. If this exercise is too easy, try doing it with your arms crossed over your chest. Belly Crunches Do these steps 5-10 times in a row:  Lie on your back on a firm bed or the floor with your legs stretched out.  Bend your knees so they point up to the ceiling. Your feet should be flat on the floor.  Cross your arms over your chest.  Tip your chin a little bit toward your chest but do not bend your neck.  Tighten your belly muscles and slowly raise your chest just enough to lift your shoulder blades a tiny bit off of the floor.  Slowly lower your chest and your head to the floor. Back Lifts Do these steps 5-10 times in a row: 1. Lie on your belly (face-down) with your arms at your sides, and rest  your forehead on the floor. 2. Tighten the muscles in your legs and your butt. 3. Slowly lift your chest off of the floor while you keep your hips on the floor. Keep the back of your head in line with the curve in your back. Look at the floor while you do this. 4. Stay in this position for 3-5 seconds. 5. Slowly lower your chest and your face to the floor. GET HELP IF:  Your back pain gets a lot worse when you do an exercise.  Your back pain does not lessen 2 hours after you exercise. If you have any of these problems, stop doing the exercises. Do not do them again unless your doctor says it is okay. GET HELP RIGHT AWAY IF:  You have sudden, very bad back pain. If this happens, stop doing the exercises. Do not do them again unless your doctor says it is okay.   This information is not intended to replace advice given to you by your health care provider. Make sure you discuss any questions you have with your health care provider.   Document Released: 10/06/2010 Document Revised: 05/25/2015 Document Reviewed: 10/28/2014 Elsevier Interactive Patient Education 2016 Elsevier Inc.  Paresthesia Paresthesia is an abnormal burning or prickling sensation. This sensation is generally felt in the hands, arms, legs, or feet. However, it may occur in any part of the body. Usually, it is not painful. The feeling may be described as:  Tingling or numbness.  Pins and needles.  Skin crawling.  Buzzing.  Limbs falling asleep.  Itching. Most people experience temporary (transient) paresthesia at some time in their lives. Paresthesia may occur when you breathe too quickly (hyperventilation). It can also occur without any apparent cause. Commonly, paresthesia occurs when pressure is placed on a nerve. The sensation quickly goes away after the pressure is removed. For some people, however, paresthesia is a long-lasting (chronic) condition that is caused by an underlying disorder. If you continue to have  paresthesia, you may need further medical evaluation. HOME CARE INSTRUCTIONS Watch your condition for any changes. Taking the following actions may help to lessen any discomfort that you  are feeling:  Avoid drinking alcohol.  Try acupuncture or massage to help relieve your symptoms.  Keep all follow-up visits as directed by your health care provider. This is important. SEEK MEDICAL CARE IF:  You continue to have episodes of paresthesia.  Your burning or prickling feeling gets worse when you walk.  You have pain, cramps, or dizziness.  You develop a rash. SEEK IMMEDIATE MEDICAL CARE IF:  You feel weak.  You have trouble walking or moving.  You have problems with speech, understanding, or vision.  You feel confused.  You cannot control your bladder or bowel movements.  You have numbness after an injury.  You faint.   This information is not intended to replace advice given to you by your health care provider. Make sure you discuss any questions you have with your health care provider.   Document Released: 08/24/2002 Document Revised: 01/18/2015 Document Reviewed: 08/30/2014 Elsevier Interactive Patient Education 2016 Elsevier Inc.  Post-Concussion Syndrome Post-concussion syndrome describes the symptoms that can occur after a head injury. These symptoms can last from weeks to months. CAUSES  It is not clear why some head injuries cause post-concussion syndrome. It can occur whether your head injury was mild or severe and whether you were wearing head protection or not.  SIGNS AND SYMPTOMS  Memory difficulties.  Dizziness.  Headaches.  Double vision or blurry vision.  Sensitivity to light.  Hearing difficulties.  Depression.  Tiredness.  Weakness.  Difficulty with concentration.  Difficulty sleeping or staying asleep.  Vomiting.  Poor balance or instability on your feet.  Slow reaction time.  Difficulty learning and remembering things you have  heard. DIAGNOSIS  There is no test to determine whether you have post-concussion syndrome. Your health care provider may order an imaging scan of your brain, such as a CT scan, to check for other problems that may be causing your symptoms (such as a severe injury inside your skull). TREATMENT  Usually, these problems disappear over time without medical care. Your health care provider may prescribe medicine to help ease your symptoms. It is important to follow up with a neurologist to evaluate your recovery and address any lingering symptoms or issues. HOME CARE INSTRUCTIONS   Take medicines only as directed by your health care provider. Do not take aspirin. Aspirin can slow blood clotting.  Sleep with your head slightly elevated to help with headaches.  Avoid any situation where there is potential for another head injury. This includes football, hockey, soccer, basketball, martial arts, downhill snow sports, and horseback riding. Your condition will get worse every time you experience a concussion. You should avoid these activities until you are evaluated by the appropriate follow-up health care providers.  Keep all follow-up visits as directed by your health care provider. This is important. SEEK MEDICAL CARE IF:  You have increased problems paying attention or concentrating.  You have increased difficulty remembering or learning new information.  You need more time to complete tasks or assignments than before.  You have increased irritability or decreased ability to cope with stress.  You have more symptoms than before. Seek medical care if you have any of the following symptoms for more than two weeks after your injury:  Lasting (chronic) headaches.  Dizziness or balance problems.  Nausea.  Vision problems.  Increased sensitivity to noise or light.  Depression or mood swings.  Anxiety or irritability.  Memory problems.  Difficulty concentrating or paying  attention.  Sleep problems.  Feeling tired all the time.  SEEK IMMEDIATE MEDICAL CARE IF:  You have confusion or unusual drowsiness.  Others find it difficult to wake you up.  You have nausea or persistent, forceful vomiting.  You feel like you are moving when you are not (vertigo). Your eyes may move rapidly back and forth.  You have convulsions or faint.  You have severe, persistent headaches that are not relieved by medicine.  You cannot use your arms or legs normally.  One of your pupils is larger than the other.  You have clear or bloody discharge from your nose or ears.  Your problems are getting worse, not better. MAKE SURE YOU:  Understand these instructions.  Will watch your condition.  Will get help right away if you are not doing well or get worse.   This information is not intended to replace advice given to you by your health care provider. Make sure you discuss any questions you have with your health care provider.   Document Released: 02/23/2002 Document Revised: 09/24/2014 Document Reviewed: 12/09/2013 Elsevier Interactive Patient Education [employer] Else[employer] Inc.
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
## 2016-07-24 ENCOUNTER — Ambulatory Visit (INDEPENDENT_AMBULATORY_CARE_PROVIDER_SITE_OTHER): Payer: Medicaid Other | Admitting: Family Medicine

## 2016-07-24 ENCOUNTER — Encounter: Payer: Self-pay | Admitting: Family Medicine

## 2016-07-24 ENCOUNTER — Other Ambulatory Visit: Payer: Self-pay | Admitting: Family Medicine

## 2016-07-24 ENCOUNTER — Telehealth: Payer: Self-pay | Admitting: *Deleted

## 2016-07-24 VITALS — BP 114/77 | HR 78 | Temp 98.3°F | Ht 67.0 in | Wt 174.6 lb

## 2016-07-24 DIAGNOSIS — R21 Rash and other nonspecific skin eruption: Secondary | ICD-10-CM | POA: Diagnosis present

## 2016-07-24 MED ORDER — MOMETASONE FUROATE 0.1 % EX CREA: 1.0000 "application " | TOPICAL_CREAM | Freq: Every day | CUTANEOUS | 0 refills | Status: DC

## 2016-07-24 MED ORDER — BETAMETHASONE DIPROPIONATE 0.05 % EX CREA: TOPICAL_CREAM | Freq: Two times a day (BID) | CUTANEOUS | 1 refills | Status: DC

## 2016-07-24 NOTE — Progress Notes (Signed)
Discontinued order for betamethasone.  Elocon cr e-scribed.  Patient informed and will go pick up.  Follow up as scheduled next week in derm clinic.  Ashly M. Nadine CountsGottschalk, DO PGY-3, [facility]

## 2016-07-24 NOTE — Telephone Encounter (Signed)
Pt was informed and was very disrespectful and angry. Pt wanted this done now. Pt stated she can not wait for PCP to sign. Had a friend in the background who was also very disrespectful. Please advise. Thanks! ep

## 2016-07-24 NOTE — Telephone Encounter (Signed)
Med changed to formulary.  Betamethasone cream cancelled.  Patient informed.

## 2016-07-24 NOTE — Patient Instructions (Signed)
Molluscum Contagiosum, Adult  Molluscum contagiosum is a skin infection that can cause a rash. When molluscum contagiosum affects the genital area, it is called a sexually transmitted disease (STD).  CAUSES  Molluscum contagiosum is caused by a virus. The virus can spread easily from person to person through:  · Skin-to-skin contact with an infected person.  · Contact with an infected object, such as a towel or clothing.  RISK FACTORS  You may be at higher risk for molluscum contagiosum if you:  · Live in an area where the weather is moist and warm.  · Have a weak body defense system (immune system).  SIGNS AND SYMPTOMS  The main symptom is a rash that appears 2-7 weeks after exposure to the virus. It is made up of 2-20 small, firm, dome-shaped bumps that may:  · Be pink or flesh-colored.  · Appear alone or in groups.  · Range from the size of a pinhead to the size of a pencil eraser.  · Feel smooth and waxy.  · Have a pit in the middle.  · Itch. The rash does not itch for most people.  The bumps often appears on the genitals, thighs, face, neck, and abdomen.  DIAGNOSIS   A health care provider can usually diagnose molluscum contagiosum by looking at the bumps on your skin. To confirm the diagnosis, your health care provider may scrape the bumps to collect a skin sample to examine under a microscope.  TREATMENT  The bumps may go away on their own, but people often have treatment to keep the virus from infecting someone else or to keep the rash from spreading to other body parts. Treatment may include:  · Surgery to remove the bumps by freezing them (cryosurgery).  · A procedure to scrape off the bumps (curettage).  · A procedure to remove the bumps with a laser.  · Putting medicine on the bumps (topical treatment).  Sometimes no treatment is needed.   HOME CARE INSTRUCTIONS  · Take medicines only as directed by your health care provider.  · As long as you have bumps on your skin, the infection can spread to others  and to other parts of your body. To prevent this from happening:    Do not scratch the bumps.    Do not share clothing or towels with others until the bumps disappear.      Avoid close contact with others until the bumps disappear. This includes sexual contact.     Wash your hands often.    Cover the bumps with clothing or a bandage when you will be near other people.  SEEK MEDICAL CARE IF:  · The bumps are spreading.  · The bumps are becoming red and sore.  · The bumps have not gone away after 12 months.     This information is not intended to replace advice given to you by your health care provider. Make sure you discuss any questions you have with your health care provider.     Document Released: 03/31/2014 Document Reviewed: 03/31/2014  Elsevier Interactive Patient Education ©2016 Elsevier Inc.

## 2016-07-24 NOTE — Progress Notes (Signed)
   Subjective: CC: rash YQM:VHQIONHPI:Sara Manning ClientHannah is a 30 y.o. female presenting to clinic today for same day appointment. PCP: Hilton SinclairKaty D Mayo, MD Concerns today include:  1. Rash Patient reports that rash started about 1 year ago on the left side of her belly.  She notes that it is now on her breasts and abdomen.  She notes that she was seen a year ago and was prescribed a cream.  She notes that it helped with itching but did not resolve the rash.  No fevers, chills, other folks with skin lesions, nausea, vomiting.  Social History Reviewed. FamHx and MedHx reviewed.  Please see EMR.  ROS: Per HPI  Objective: Office vital signs reviewed. BP 114/77   Pulse 78   Temp 98.3 F (36.8 C) (Oral)   Ht 5\' 7"  (1.702 m)   Wt 174 lb 9.6 oz (79.2 kg)   BMI 27.35 kg/m   Physical Examination:  General: Awake, alert, well nourished, No acute distress Skin: dry, diffuse erythematous, blanching papular rash along abdomen and inner aspects of breast.  Several lesions with open comedones/ rolled edges. Nonexudative, nonbleeding. nontender to palpation. No mucosal involvement. Not affecting palms or soles.      Assessment/ Plan: 30 y.o. female   1. Rash and nonspecific skin eruption.  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  Unfortunately did not have time to perform skin biopsy during this same day appt but [facility]. - Derm clinic appt scheduled for 11/15 at 3:15pm - betamethasone dipropionate (DIPROLENE) 0.05 % cream; Apply topically 2 (two) times daily.  Dispense: 90 g; Refill: 1 - Consider formal derm referral - Return precautions reviewed - Follow up as scheduled.  Raliegh IpAshly M Gottschalk, DO PGY-3, [facility]

## 2016-07-24 NOTE — Telephone Encounter (Signed)
Prior Authorization received from [facility] pharmacy for Betamethasone Dipropionate 0.05% cream. Formulary and PA form placed in provider box for completion. Clovis PuMartin, Tamika L, RN

## 2016-07-25 ENCOUNTER — Ambulatory Visit: Payer: Medicaid Other | Admitting: Family Medicine

## 2016-08-01 ENCOUNTER — Ambulatory Visit: Payer: Medicaid Other

## 2017-02-28 ENCOUNTER — Encounter (HOSPITAL_COMMUNITY): Payer: Self-pay

## 2017-02-28 ENCOUNTER — Emergency Department (HOSPITAL_COMMUNITY)
Admission: EM | Admit: 2017-02-28 | Discharge: 2017-02-28 | Disposition: A | Discharge status: Home / Self Care | Payer: Medicaid Other | Attending: Emergency Medicine | Admitting: Emergency Medicine

## 2017-02-28 DIAGNOSIS — Z791 Long term (current) use of non-steroidal anti-inflammatories (NSAID): Secondary | ICD-10-CM | POA: Insufficient documentation

## 2017-02-28 DIAGNOSIS — Z79899 Other long term (current) drug therapy: Secondary | ICD-10-CM | POA: Insufficient documentation

## 2017-02-28 DIAGNOSIS — K047 Periapical abscess without sinus: Secondary | ICD-10-CM | POA: Insufficient documentation

## 2017-02-28 MED ORDER — KETOROLAC TROMETHAMINE 60 MG/2ML IM SOLN
30.0000 mg | Freq: Once | INTRAMUSCULAR | Status: AC
  Administered 2017-02-28: 30 mg via INTRAMUSCULAR
  Filled 2017-02-28: qty 2

## 2017-02-28 MED ORDER — OXYCODONE-ACETAMINOPHEN 5-325 MG PO TABS
1.0000 | ORAL_TABLET | Freq: Once | ORAL | Status: AC
  Administered 2017-02-28: 1 via ORAL
  Filled 2017-02-28: qty 1

## 2017-02-28 MED ORDER — NAPROXEN 500 MG PO TABS: 500.0000 mg | ORAL_TABLET | Freq: Two times a day (BID) | ORAL | 0 refills | Status: DC

## 2017-02-28 MED ORDER — TRAMADOL HCL 50 MG PO TABS: 50.0000 mg | ORAL_TABLET | Freq: Four times a day (QID) | ORAL | 0 refills | Status: DC | PRN

## 2017-02-28 MED ORDER — AMOXICILLIN 500 MG PO CAPS: 500.0000 mg | ORAL_CAPSULE | Freq: Three times a day (TID) | ORAL | 0 refills | Status: DC

## 2017-02-28 MED ORDER — AMOXICILLIN 500 MG PO CAPS
500.0000 mg | ORAL_CAPSULE | Freq: Once | ORAL | Status: AC
  Administered 2017-02-28: 500 mg via ORAL
  Filled 2017-02-28: qty 1

## 2017-02-28 NOTE — ED Provider Notes (Signed)
[facility]PT Provider Note   CSN: [id number] Arrival date & time: 02/28/17  1927     History   Chief Complaint Chief Complaint  Patient presents with  . Dental Pain    HPI Sara Manning is a 31 y.o. female who presents to the ED with dental pain. The pain has been off and on for the past month but has gotten worse over the past 4 days. Patient does not have a dentist. She has used goody powder and applied it directly to the tooth without relief. She been taking other OTC medications without relief. She denies fever or chills. No facial swelling.   The history is provided by the patient. No language interpreter was used.  Dental Pain   The current episode started more than 2 days ago. The problem occurs constantly. The problem has been gradually worsening. The pain is at a severity of 9/10. She has tried acetaminophen for the symptoms. The treatment provided no relief.    Past Medical History:  Diagnosis Date  . Bipolar 2 disorder (HCC)   . WUJWJXBJ(478.2Headache(784.0)     Patient Active Problem List   Diagnosis Date Noted  . Eczema 12/01/2015  . Headache 10/10/2015  . Mid back pain on right side 12/23/2014  . Bipolar disorder, current episode depressed, moderate (HCC) 06/22/2011    Past Surgical History:  Procedure Laterality Date  . WISDOM TOOTH EXTRACTION      OB History    Gravida Para Term Preterm AB Living   3 3 3  0 0 3   SAB TAB Ectopic Multiple Live Births   0 0 0 0 3       Home Medications    Prior to Admission medications   Medication Sig Start Date End Date Taking? Authorizing Provider  amoxicillin (AMOXIL) 500 MG capsule Take 1 capsule (500 mg total) by mouth 3 (three) times daily. 02/28/17   Janne NapoleonNeese, Hope M, NP  ibuprofen (ADVIL,MOTRIN) 800 MG tablet Take 1 tablet (800 mg total) by mouth 3 (three) times daily. 01/23/16   Fayrene Helperran, Bowie, PA-C  methocarbamol (ROBAXIN) 500 MG tablet Take 1 tablet (500 mg total) by mouth 2 (two) times daily. 01/23/16   Fayrene Helperran, Bowie,  PA-C  mometasone (ELOCON) 0.1 % cream Apply 1 application topically daily. For no longer than 2 weeks. 07/24/16   Raliegh IpGottschalk, Ashly M, DO  naproxen (NAPROSYN) 500 MG tablet Take 1 tablet (500 mg total) by mouth 2 (two) times daily. 02/28/17   Janne NapoleonNeese, Hope M, NP  traMADol (ULTRAM) 50 MG tablet Take 1 tablet (50 mg total) by mouth every 6 (six) hours as needed. 02/28/17   Janne NapoleonNeese, Hope M, NP  triamcinolone cream (KENALOG) 0.1 % Apply 1 application topically 2 (two) times daily as needed (for itching.). 12/01/15   Jamal CollinJoyner, James R, MD    Family History Family History  Problem Relation Age of Onset  . Bipolar disorder Mother   . Diabetes Mother   . Bipolar disorder Sister   . Schizophrenia Sister     Social History Social History  Substance Use Topics  . Smoking status: Never Smoker  . Smokeless tobacco: Never Used  . Alcohol use No     Allergies   Patient has no known allergies.   Review of Systems Review of Systems  Constitutional: Negative for chills and fever.  HENT: Positive for dental problem. Negative for ear pain, facial swelling, sore throat and trouble swallowing.   Respiratory: Negative for cough.   Musculoskeletal: Negative for  myalgias.  Skin: Negative for rash.  Neurological: Positive for headaches.  Hematological: Positive for adenopathy.  Psychiatric/Behavioral: The patient is not nervous/anxious.      Physical Exam Updated Vital Signs BP 130/85 (BP Location: Right Arm)   Pulse 73   Temp 98.6 F (37 C) (Oral)   Resp 14   Ht 5\' 7"  (1.702 m)   Wt 78 kg (172 lb)   LMP  (LMP Unknown)   SpO2 100%   BMI 26.94 kg/m   Physical Exam  Constitutional: She is oriented to person, place, and time. She appears well-developed and well-nourished. No distress.  HENT:  Head: Normocephalic.  Right Ear: Tympanic membrane normal.  Left Ear: Tympanic membrane normal.  Nose: Nose normal.  Mouth/Throat: Uvula is midline and mucous membranes are normal. Dental caries present.     Left upper dental area with tenderness and swelling of the gum. Second left upper molar with large decay and tender on exam.   Eyes: EOM are normal.  Neck: Neck supple.  Cardiovascular: Normal rate and regular rhythm.   Pulmonary/Chest: Effort normal and breath sounds normal.  Abdominal: Soft. There is no tenderness.  Musculoskeletal: Normal range of motion.  Lymphadenopathy:    She has cervical adenopathy (left).  Neurological: She is alert and oriented to person, place, and time. No cranial nerve deficit.  Skin: Skin is warm and dry.  Psychiatric: She has a normal mood and affect. Her behavior is normal.  Nursing note and vitals reviewed.    ED Treatments / Results  Labs (all labs ordered are listed, but only abnormal results are displayed) Labs Reviewed - No data to display  Radiology No results found.  Procedures Procedures (including critical care time)  Medications Ordered in ED Medications  oxyCODONE-acetaminophen (PERCOCET/ROXICET) 5-325 MG per tablet 1 tablet (1 tablet Oral Given 02/28/17 2050)  amoxicillin (AMOXIL) capsule 500 mg (500 mg Oral Given 02/28/17 2050)  ketorolac (TORADOL) injection 30 mg (30 mg Intramuscular Given 02/28/17 2050)     Initial Impression / Assessment and Plan / ED Course  I have reviewed the triage vital signs and the nursing notes.  Final Clinical Impressions(s) / ED Diagnoses  31 y.o. female with dental pain, decay and infection stable for d/c without signs of Ludwig's Angina. No fever, and does not appear toxic. Will treat with antibiotics and pain management. Dental referral given.  Final diagnoses:  Dental abscess    New Prescriptions Discharge Medication List as of 02/28/2017  8:29 PM    START taking these medications   Details  amoxicillin (AMOXIL) 500 MG capsule Take 1 capsule (500 mg total) by mouth 3 (three) times daily., Starting Thu 02/28/2017, Print    naproxen (NAPROSYN) 500 MG tablet Take 1 tablet (500 mg total) by  mouth 2 (two) times daily., Starting Thu 02/28/2017, Print    traMADol (ULTRAM) 50 MG tablet Take 1 tablet (50 mg total) by mouth every 6 (six) hours as needed., Starting Thu 02/28/2017, Print         [place], [place], NP 02/28/17 2131    Shaune Pollack, MD 03/02/17 1354

## 2017-02-28 NOTE — Discharge Instructions (Signed)
Do not take the narcotic pain medication if driving as it will make you sleepy.  Call Dr. Norris Crossurner's office for follow up.

## 2017-02-28 NOTE — ED Triage Notes (Signed)
Pt endorses left lower/upper dental pain x 1 month. Pt states "I have a hole in my tooth" Pt does not have a dentist. VSS.

## 2017-03-30 ENCOUNTER — Emergency Department (HOSPITAL_COMMUNITY)
Admission: EM | Admit: 2017-03-30 | Discharge: 2017-03-30 | Disposition: A | Discharge status: Home / Self Care | Payer: Medicaid Other | Attending: Emergency Medicine | Admitting: Emergency Medicine

## 2017-03-30 ENCOUNTER — Encounter (HOSPITAL_COMMUNITY): Payer: Self-pay

## 2017-03-30 DIAGNOSIS — Z79899 Other long term (current) drug therapy: Secondary | ICD-10-CM | POA: Insufficient documentation

## 2017-03-30 DIAGNOSIS — F3181 Bipolar II disorder: Secondary | ICD-10-CM | POA: Insufficient documentation

## 2017-03-30 DIAGNOSIS — R1012 Left upper quadrant pain: Secondary | ICD-10-CM

## 2017-03-30 LAB — URINALYSIS, ROUTINE W REFLEX MICROSCOPIC
Bilirubin Urine: NEGATIVE
GLUCOSE, UA: NEGATIVE mg/dL
KETONES UR: NEGATIVE mg/dL
NITRITE: NEGATIVE
PH: 6 (ref 5.0–8.0)
PROTEIN: 100 mg/dL — AB
Specific Gravity, Urine: 1.008 (ref 1.005–1.030)

## 2017-03-30 LAB — CBC
HCT: 40.7 % (ref 36.0–46.0)
HEMOGLOBIN: 13.5 g/dL (ref 12.0–15.0)
MCH: 28.7 pg (ref 26.0–34.0)
MCHC: 33.2 g/dL (ref 30.0–36.0)
MCV: 86.6 fL (ref 78.0–100.0)
PLATELETS: 296 10*3/uL (ref 150–400)
RBC: 4.7 MIL/uL (ref 3.87–5.11)
RDW: 13.3 % (ref 11.5–15.5)
WBC: 9.5 10*3/uL (ref 4.0–10.5)

## 2017-03-30 LAB — COMPREHENSIVE METABOLIC PANEL
ALK PHOS: 92 U/L (ref 38–126)
ALT: 25 U/L (ref 14–54)
ANION GAP: 8 (ref 5–15)
AST: 22 U/L (ref 15–41)
Albumin: 3.8 g/dL (ref 3.5–5.0)
BILIRUBIN TOTAL: 0.6 mg/dL (ref 0.3–1.2)
BUN: 5 mg/dL — ABNORMAL LOW (ref 6–20)
CALCIUM: 9.2 mg/dL (ref 8.9–10.3)
CO2: 24 mmol/L (ref 22–32)
CREATININE: 0.77 mg/dL (ref 0.44–1.00)
Chloride: 106 mmol/L (ref 101–111)
Glucose, Bld: 91 mg/dL (ref 65–99)
Potassium: 3.5 mmol/L (ref 3.5–5.1)
SODIUM: 138 mmol/L (ref 135–145)
TOTAL PROTEIN: 7.4 g/dL (ref 6.5–8.1)

## 2017-03-30 LAB — PREGNANCY, URINE: Preg Test, Ur: NEGATIVE

## 2017-03-30 LAB — LIPASE, BLOOD: Lipase: 18 U/L (ref 11–51)

## 2017-03-30 MED ORDER — RANITIDINE HCL 150 MG PO CAPS: 150.0000 mg | ORAL_CAPSULE | Freq: Every day | ORAL | 0 refills | Status: DC

## 2017-03-30 MED ORDER — ONDANSETRON 4 MG PO TBDP
ORAL_TABLET | ORAL | Status: AC
  Filled 2017-03-30: qty 1

## 2017-03-30 MED ORDER — OXYCODONE-ACETAMINOPHEN 5-325 MG PO TABS
1.0000 | ORAL_TABLET | ORAL | Status: DC | PRN
  Administered 2017-03-30: 1 via ORAL

## 2017-03-30 MED ORDER — GI COCKTAIL ~~LOC~~
30.0000 mL | Freq: Once | ORAL | Status: AC
  Administered 2017-03-30: 30 mL via ORAL
  Filled 2017-03-30: qty 30

## 2017-03-30 MED ORDER — OXYCODONE-ACETAMINOPHEN 5-325 MG PO TABS
ORAL_TABLET | ORAL | Status: AC
  Filled 2017-03-30: qty 1

## 2017-03-30 NOTE — ED Notes (Signed)
Pt c/o left abdominal tenderness for 3-4 days, no bowel movement in same time frame.

## 2017-03-30 NOTE — ED Notes (Signed)
Pt verbalized understanding discharge instructions and denies any further needs or questions at this time. VS stable, ambulatory and steady gait.   

## 2017-03-30 NOTE — ED Triage Notes (Signed)
Pt reports to the ed for complaints of left sided abdominal pain x 1 week. Reports nausea with it, denies any other symptoms.

## 2017-03-30 NOTE — ED Provider Notes (Signed)
[facility] Provider Note   CSN: [id number] Arrival date & time: 03/30/17  1603     History   Chief Complaint Chief Complaint  Patient presents with  . Abdominal Pain    HPI Sara Manning is a 31 y.o. female.  The history is provided by the patient.  Abdominal Pain   This is a new problem. Episode onset: 5 days. The problem occurs constantly. The problem has been gradually worsening. The pain is associated with an unknown factor. Pain location: left sided. The quality of the pain is aching and dull. Associated symptoms include nausea and constipation. Pertinent negatives include fever, diarrhea, melena, vomiting, dysuria, hematuria and myalgias. The symptoms are aggravated by certain positions. The symptoms are relieved by certain positions.   LMP in May; has irregular cycles; not on birth control.   Past Medical History:  Diagnosis Date  . Bipolar 2 disorder (HCC)   . WUJWJXBJ(478.2)     Patient Active Problem List   Diagnosis Date Noted  . Eczema 12/01/2015  . Headache 10/10/2015  . Mid back pain on right side 12/23/2014  . Bipolar disorder, current episode depressed, moderate (HCC) 06/22/2011    Past Surgical History:  Procedure Laterality Date  . WISDOM TOOTH EXTRACTION      OB History    Gravida Para Term Preterm AB Living   3 3 3  0 0 3   SAB TAB Ectopic Multiple Live Births   0 0 0 0 3       Home Medications    Prior to Admission medications   Medication Sig Start Date End Date Taking? Authorizing Provider  mometasone (ELOCON) 0.1 % cream Apply 1 application topically daily. For no longer than 2 weeks. Patient taking differently: Apply 1 application topically daily. For no longer than 2 weeks; for eczema 07/24/16  Yes Gottschalk, Ashly M, DO  amoxicillin (AMOXIL) 500 MG capsule Take 1 capsule (500 mg total) by mouth 3 (three) times daily. Patient not taking: Reported on 03/30/2017 02/28/17   Janne Napoleon, NP  ibuprofen (ADVIL,MOTRIN) 800 MG  tablet Take 1 tablet (800 mg total) by mouth 3 (three) times daily. Patient not taking: Reported on 03/30/2017 01/23/16   Fayrene Helper, PA-C  methocarbamol (ROBAXIN) 500 MG tablet Take 1 tablet (500 mg total) by mouth 2 (two) times daily. Patient not taking: Reported on 03/30/2017 01/23/16   Fayrene Helper, PA-C  naproxen (NAPROSYN) 500 MG tablet Take 1 tablet (500 mg total) by mouth 2 (two) times daily. Patient not taking: Reported on 03/30/2017 02/28/17   Janne Napoleon, NP  ranitidine (ZANTAC) 150 MG capsule Take 1 capsule (150 mg total) by mouth daily. 03/30/17   Nira Conn, MD  traMADol (ULTRAM) 50 MG tablet Take 1 tablet (50 mg total) by mouth every 6 (six) hours as needed. Patient not taking: Reported on 03/30/2017 02/28/17   Janne Napoleon, NP  triamcinolone cream (KENALOG) 0.1 % Apply 1 application topically 2 (two) times daily as needed (for itching.). Patient not taking: Reported on 03/30/2017 12/01/15   Jamal Collin, MD    Family History Family History  Problem Relation Age of Onset  . Bipolar disorder Mother   . Diabetes Mother   . Bipolar disorder Sister   . Schizophrenia Sister     Social History Social History  Substance Use Topics  . Smoking status: Never Smoker  . Smokeless tobacco: Never Used  . Alcohol use No     Allergies   Patient  has no known allergies.   Review of Systems Review of Systems  Constitutional: Negative for fever.  Gastrointestinal: Positive for abdominal pain, constipation and nausea. Negative for diarrhea, melena and vomiting.  Genitourinary: Negative for dysuria and hematuria.  Musculoskeletal: Negative for myalgias.  All other systems are reviewed and are negative for acute change except as noted in the HPI    Physical Exam Updated Vital Signs BP 109/74 (BP Location: Right Arm)   Pulse 98   Temp 98.7 F (37.1 C) (Oral)   Resp 18   Ht 5\' 7"  (1.702 m)   Wt 78 kg (172 lb)   LMP  (LMP Unknown)   SpO2 100%   BMI 26.94 kg/m    Physical Exam  Constitutional: She is oriented to person, place, and time. She appears well-developed and well-nourished. No distress.  HENT:  Head: Normocephalic and atraumatic.  Right Ear: External ear normal.  Left Ear: External ear normal.  Nose: Nose normal.  Eyes: Conjunctivae and EOM are normal. No scleral icterus.  Neck: Normal range of motion and phonation normal.  Cardiovascular: Normal rate and regular rhythm.   Pulmonary/Chest: Effort normal. No stridor. No respiratory distress.  Abdominal: She exhibits no distension. There is no hepatosplenomegaly. There is tenderness in the periumbilical area and left upper quadrant. There is no rigidity, no rebound, no guarding and no CVA tenderness.  Musculoskeletal: Normal range of motion. She exhibits no edema.  Neurological: She is alert and oriented to person, place, and time.  Skin: She is not diaphoretic.  Psychiatric: She has a normal mood and affect. Her behavior is normal.  Vitals reviewed.    ED Treatments / Results  Labs (all labs ordered are listed, but only abnormal results are displayed) Labs Reviewed  COMPREHENSIVE METABOLIC PANEL - Abnormal; Notable for the following:       Result Value   BUN 5 (*)    All other components within normal limits  URINALYSIS, ROUTINE W REFLEX MICROSCOPIC - Abnormal; Notable for the following:    Hgb urine dipstick MODERATE (*)    Protein, ur 100 (*)    Leukocytes, UA SMALL (*)    Bacteria, UA RARE (*)    Squamous Epithelial / LPF 0-5 (*)    All other components within normal limits  LIPASE, BLOOD  CBC  PREGNANCY, URINE    EKG  EKG Interpretation None       Radiology No results found.  Procedures Procedures (including critical care time)  Medications Ordered in ED Medications  oxyCODONE-acetaminophen (PERCOCET/ROXICET) 5-325 MG per tablet 1 tablet (1 tablet Oral Given 03/30/17 1620)  ondansetron (ZOFRAN-ODT) 4 MG disintegrating tablet (not administered)  gi  cocktail (Maalox,Lidocaine,Donnatal) (30 mLs Oral Given 03/30/17 2002)     Initial Impression / Assessment and Plan / ED Course  I have reviewed the triage vital signs and the nursing notes.  Pertinent labs & imaging results that were available during my care of the patient were reviewed by me and considered in my medical decision making (see chart for details).     Several days of left upper quadrant abdominal pain. Associated nausea without vomiting. No diarrhea. Abdomen with left upper quadrant discomfort but abdomen not peritonitic. Labs grossly reassuring without evidence of pancreatitis, biliary obstruction. UPT negative. UA without evidence of infection.  Improved symptomatology following GI cocktail. Likely gastritis. Low suspicion for serious intra-abdominal inflammatory/infectious processes, small bowel obstruction, acute cholecystitis.  Able to tolerate by mouth. Safe for discharge with strict return precautions.  Final  Clinical Impressions(s) / ED Diagnoses   Final diagnoses:  Left upper quadrant pain   Disposition: Discharge  Condition: Good  I have discussed the results, Dx and Tx plan with the patient who expressed understanding and agree(s) with the plan. Discharge instructions discussed at great length. The patient was given strict return precautions who verbalized understanding of the instructions. No further questions at time of discharge.    New Prescriptions   RANITIDINE (ZANTAC) 150 MG CAPSULE    Take 1 capsule (150 mg total) by mouth daily.    Follow Up: Dr. Nancy MarusMayo  Schedule an appointment as soon as possible for a visit  If symptoms do not improve or  worsen      Master Touchet, Amadeo GarnetPedro Eduardo, MD 03/30/17 2131

## 2017-09-25 ENCOUNTER — Ambulatory Visit: Payer: Self-pay | Admitting: Internal Medicine

## 2017-10-23 ENCOUNTER — Other Ambulatory Visit: Payer: Self-pay

## 2017-10-23 ENCOUNTER — Encounter: Payer: Self-pay | Admitting: Internal Medicine

## 2017-10-23 ENCOUNTER — Ambulatory Visit: Payer: Medicaid Other | Admitting: Internal Medicine

## 2017-10-23 VITALS — BP 118/74 | HR 62 | Temp 98.2°F | Wt 175.0 lb

## 2017-10-23 DIAGNOSIS — R51 Headache: Secondary | ICD-10-CM

## 2017-10-23 DIAGNOSIS — R519 Headache, unspecified: Secondary | ICD-10-CM

## 2017-10-23 DIAGNOSIS — L309 Dermatitis, unspecified: Secondary | ICD-10-CM

## 2017-10-23 MED ORDER — KETOROLAC TROMETHAMINE 30 MG/ML IJ SOLN
30.0000 mg | Freq: Once | INTRAMUSCULAR | Status: AC
  Administered 2017-10-23: 30 mg via INTRAMUSCULAR

## 2017-10-23 MED ORDER — TRIAMCINOLONE ACETONIDE 0.1 % EX CREA
1.0000 | TOPICAL_CREAM | Freq: Two times a day (BID) | CUTANEOUS | 1 refills | Status: DC | PRN
Start: 2017-10-23 — End: 2017-12-17

## 2017-10-23 NOTE — Patient Instructions (Signed)
There are 3 lifestyle behaviors that are important to minimize headaches.  You should sleep 8 hours at night time.  Bedtime should be a set time for going to bed and waking up with few exceptions.  You need to drink about 60 ounces of water per day, more on days when you are out in the heat.  This works out to five 12 oz ounce water bottles per day.  You may need to flavor the water so that you will be more likely to drink it.  Do not use Kool-Aid or other sugar drinks because they add empty calories and actually increase urine output.  You need to eat 3 meals per day.  You should not skip meals.  The meal does not have to be a big one.  Make daily entries into the headache calendar and sent it to me at the end of each calendar month.  I will call you or your parents and we will discuss the results of the headache calendar and make a decision about changing treatment if indicated.   Please keep the headache diary and take it to your appointment with the specialist.

## 2017-10-23 NOTE — Progress Notes (Signed)
Subjective:    Sara Manning - 32 y.o. female MRN [id number]  Date of birth: 29-Sep-1985  HPI  WUJWJX NATAKI ARTS is here for headaches and ezcema.  Headaches: Have been occurring for years. Occur daily. Located mostly on the right side of her head. Last for several hours at a time and feels like she never gets complete relief. Has photophobia with headaches. About twice in the past year she has had associated nausea and vomiting but that is not typical of her headaches. Denies vision changes. No focal weakness, fevers, nasal congestion, confusion, or changes in speech. She takes Tylenol daily sometimes 4+ tablets per day. Drinks about 1-1.5 cups water per day. Does not eat regularly. Sleeps a lot. Has a headache currently.   Eczema: Out of her steroid cream, requesting a refill. Stable with a few scattered patches.    -  reports that  has never smoked. she has never used smokeless tobacco. - Review of Systems: Per HPI. - Past Medical History: Patient Active Problem List   Diagnosis Date Noted  . Eczema 12/01/2015  . Headache 10/10/2015  . Mid back pain on right side 12/23/2014  . Bipolar disorder, current episode depressed, moderate (HCC) 06/22/2011   - Medications: reviewed and updated   Objective:   Physical Exam BP 118/74   Pulse 62   Temp 98.2 F (36.8 C) (Oral)   Wt 175 lb (79.4 kg)   SpO2 99%   BMI 27.41 kg/m  Gen: NAD, alert, cooperative with exam, well-appearing HEENT: NCAT, PERRL, clear conjunctiva, oropharynx clear, supple neck CV: RRR, good S1/S2, no murmur, no edema, capillary refill brisk  Resp: CTABL, no wheezes, non-labored Skin: scattered dry, scaling patches on extremities and trunk  MSK: No TTP of cervical paraspinal muscles. Full ROM at neck.  Neuro: CN II-XII grossly intact. Strength 5/5 in all extremities. Sensation in all extremities grossly intact. Gait normal.  Psych: good insight, alert and oriented     Assessment & Plan:   1. Eczema Chronic  and stable. Refill of steroid cream given.  - triamcinolone cream (KENALOG) 0.1 %; Apply 1 application topically 2 (two) times daily as needed (for itching.).  Dispense: 45 g; Refill: 1  2. Acute headache, unspecified headache type Patient with chronic, daily headaches. Sounds mostly like tension type headache given chronicity and location of pain without many associated symptoms. May have a overlying migraine component given occasional headaches associated with vomiting. Unfortunately, excessive use of Tylenol may also be clouding picture with potential for rebound headaches. No neurological findings or red flags to warrant head imaging at this time. Have given Toradol injection today in an attempt to break headache cycle. Counseled on lifestyle modifications such as increasing water intake and more frequent meals that may improve headaches. Have asked patient to keep a headache diary. Given chronicity and daily occurrence, have referred to headache clinic for further evaluation.  - AMB referral to headache clinic - ketorolac (TORADOL) 30 MG/ML injection 30 mg  Marcy Siren, D.O. 10/23/2017, 11:09 AM PGY-3, [facility]

## 2017-11-07 ENCOUNTER — Other Ambulatory Visit: Payer: Self-pay

## 2017-11-07 ENCOUNTER — Ambulatory Visit: Payer: Medicaid Other | Admitting: Internal Medicine

## 2017-11-07 VITALS — BP 100/80 | HR 80 | Temp 98.5°F | Ht 67.0 in | Wt 180.8 lb

## 2017-11-07 DIAGNOSIS — L049 Acute lymphadenitis, unspecified: Secondary | ICD-10-CM | POA: Diagnosis not present

## 2017-11-07 MED ORDER — AMOXICILLIN-POT CLAVULANATE 875-125 MG PO TABS: 1.0000 | ORAL_TABLET | Freq: Two times a day (BID) | ORAL | 0 refills | Status: DC

## 2017-11-07 NOTE — Patient Instructions (Signed)
It was so nice to see you!  I think you have a bacterial infection around one of the lymph nodes in your neck. I have prescribed an antibiotic called Augmentin. Please take one tablet twice a day for 14 days. If the swelling gets worse on the antibiotics or if it has not resolved after the antibiotic course, please come back to see us!  -Dr. Nancy MarusMayo

## 2017-11-08 DIAGNOSIS — L049 Acute lymphadenitis, unspecified: Secondary | ICD-10-CM | POA: Insufficient documentation

## 2017-11-08 NOTE — Progress Notes (Signed)
   [facility] Phone: [phone]  Subjective:  Sara Manning is a 32 year old female presenting to clinic for a painful lump of her neck that has been present since yesterday. The mass is increasing in size. The pain is worse with looking up and down, as well as side to side. The pain is also worse with eating and opening her mouth. She has tried taking Tylenol, which hasn't helped. She has not had any recent URI symptoms. No fevers. No tooth pain or drainage into her mouth.   ROS: See HPI for pertinent positives and negatives  Past Medical History- eczema, bipolar disorder  Family history reviewed for today's visit. No changes.  Social history- patient is a never smoker  Objective: BP 100/80 (BP Location: Left Arm, Patient Position: Sitting, Cuff Size: Normal)   Pulse 80   Temp 98.5 F (36.9 C) (Oral)   Ht 5\' 7"  (1.702 m)   Wt 180 lb 12.8 oz (82 kg)   SpO2 99%   BMI 28.32 kg/m  Gen: NAD, alert, cooperative with exam HEENT: NCAT, EOMI, MMM, teeth normal, fair dentition, no drainage, no trismus Neck: FROM, no thyromegaly, 1cm x 1cm submandibular fluctuant mass present on the left side, no overlying erythema or warmth, mass is exquisitely tender to palpation.  Assessment/Plan: Lymphadenitis: Patient with left sided neck mass, likely lymphadenitis given acute onset, tenderness to palpation, and fluctuance appreciated on exam.  - Will treat empirically for acute bacterial lymphadenitis with Augmentin 875-125mg  bid x 14 days - Return to clinic ASAP if worsening or enlarging on antibiotics. Would need to consider direct admission, IV abx, imaging, ENT consult at that time - If the mass persists after course of abx, would need to consider biopsy   Sara CarolKaty Mayo, MD PGY-3

## 2017-11-08 NOTE — Assessment & Plan Note (Addendum)
Patient with left sided neck mass, likely lymphadenitis given acute onset, tenderness to palpation, and fluctuance appreciated on exam.  - Will treat empirically for acute bacterial lymphadenitis with Augmentin 875-125mg  bid x 14 days - Return to clinic ASAP if worsening or enlarging on antibiotics. Would need to consider direct admission, IV abx, imaging, ENT consult at that time - If the mass persists after course of abx, would need to consider biopsy

## 2017-11-11 ENCOUNTER — Telehealth: Payer: Self-pay | Admitting: Internal Medicine

## 2017-11-11 NOTE — Telephone Encounter (Signed)
[facility] After Hours Telephone Line   Patient calling reporting blood when using the bathroom. Says beginning yesterday, when she goes to the bathroom she passes blood clots. Says some are "bigger than my head." Has irregular periods and cannot say when her last period was. She she "guesses" she is sexually active. Is not on any form of contraception. Says that current bleeding is different from typical menstrual bleeding. Denies fevers, chills, abdominal pain, nausea, vomiting, dizziness. Has not taken a pregnancy test at home. As symptom onset only yesterday and patient without dizziness, think that emergency anemia due to blood loss is unlikely. Could be menstrual bleeding, or possibly miscarriage of a pregnancy patient has been unaware of, as she is sexually active though no on contraception. Discussed with patient that given reported symptoms, she is stable to be seen in clinic tomorrow. Encouraged patient to call when clinic opens in AM to schedule appt. Would perform pregnancy test and UA. Would also recommend STD screening if patient amenable given unprotected intercourse, as well as discussion regarding contraception options.  Of note, patient seen for lymphadenitis on 02/21 and started on abx. Two complaints do not seem connected, though would recommend following up on lymphadenitis when/if patient seen in clinic tomorrow.   Tarri AbernethyAbigail J Adiva Boettner, MD, MPH PGY-3 [facility] Pager [phone]

## 2017-12-17 ENCOUNTER — Ambulatory Visit: Payer: Medicaid Other | Admitting: Internal Medicine

## 2017-12-17 ENCOUNTER — Encounter: Payer: Self-pay | Admitting: Internal Medicine

## 2017-12-17 ENCOUNTER — Other Ambulatory Visit: Payer: Self-pay

## 2017-12-17 VITALS — BP 110/70 | HR 83 | Temp 98.2°F | Ht 67.0 in | Wt 175.2 lb

## 2017-12-17 DIAGNOSIS — L309 Dermatitis, unspecified: Secondary | ICD-10-CM | POA: Diagnosis present

## 2017-12-17 MED ORDER — TRIAMCINOLONE ACETONIDE 0.5 % EX OINT: 1.0000 "application " | TOPICAL_OINTMENT | Freq: Two times a day (BID) | CUTANEOUS | 3 refills | Status: DC

## 2017-12-17 NOTE — Progress Notes (Signed)
Subjective:    Sara Manning - 32 y.o. female MRN [id number]  Date of birth: [birth year]  HPI  Sara Manning is here for eczema. Reports significant new skin lesions on her trunk. She has been out of Triamcinolone cream for several weeks. Requests refill today. Has decent relief of lesions with the cream but small amounts have been prescribed in the past and she also requests increase in potency of the steroid.    -  reports that she has never smoked. She has never used smokeless tobacco. - Review of Systems: Per HPI. - Past Medical History: Patient Active Problem List   Diagnosis Date Noted  . Lymphadenitis, acute 11/08/2017  . Eczema 12/01/2015  . Bipolar disorder, current episode depressed, moderate (HCC) 06/22/2011   - Medications: reviewed and updated   Objective:   Physical Exam BP 110/70   Pulse 83   Temp 98.2 F (36.8 C) (Oral)   Ht 5\' 7"  (1.702 m)   Wt 175 lb 3.2 oz (79.5 kg)   SpO2 99%   BMI 27.44 kg/m  Gen: NAD, alert, cooperative with exam, well-appearing Skin: diffuse areas of scaling, erythematous skin present across stomach, back and upper arms  Assessment & Plan:   1. Eczema, unspecified type Will provide refill of Triamcinolone with increase in potency to 0.5% from 0.1%. Discussed limiting to use of 1 week at time due to risk of thinning the skin and changing skin pigmentation. Follow up with PCP if this fails to control eczema.  - triamcinolone ointment (KENALOG) 0.5 %; Apply 1 application topically 2 (two) times daily. For moderate to severe eczema.  Do not use for more than 1 week at a time.  Dispense: 60 g; Refill: 3   Marcy Siren, D.O. 12/17/2017, 3:39 PM PGY-3, [facility]

## 2017-12-17 NOTE — Patient Instructions (Signed)
I have sent in a stronger version of your steroid cream. Please follow up if this does not help with your eczema.

## 2017-12-26 ENCOUNTER — Ambulatory Visit (INDEPENDENT_AMBULATORY_CARE_PROVIDER_SITE_OTHER): Payer: Medicaid Other | Admitting: Internal Medicine

## 2017-12-26 ENCOUNTER — Ambulatory Visit
Admission: RE | Admit: 2017-12-26 | Discharge: 2017-12-26 | Disposition: A | Discharge status: Home / Self Care | Payer: Medicaid Other | Source: Ambulatory Visit | Attending: Family Medicine | Admitting: Family Medicine

## 2017-12-26 ENCOUNTER — Other Ambulatory Visit: Payer: Self-pay

## 2017-12-26 VITALS — BP 118/74 | HR 86 | Temp 98.4°F | Wt 175.8 lb

## 2017-12-26 DIAGNOSIS — M79672 Pain in left foot: Secondary | ICD-10-CM

## 2017-12-26 NOTE — Progress Notes (Signed)
   [facility] Phone: [phone]  Subjective:  Sara AbrahamsShakia is a 32 year old female presenting to clinic with left foot pain. Two days ago, she was at her child's doctor's office when her right foot got stuck on some blinds, causing her to fall. After she got up, she noticed that her left foot was hurting her and she was unable to walk without limping. She is unsure if she twisted her left ankle or landed funny on her foot, because it all happened so fast. The pain is located on the top of her foot. The pain dose not radiate. Pain is worse with walking and bending her toes back. Pain is better with rest. The pain feels like a "hurt". No swelling, no redness. She has not taken any medications for this.  ROS: See HPI for pertinent positives and negatives  Past Medical History- eczema, bipolar disorder  Family history reviewed for today's visit. No changes.  Social history- patient is a never smoker  Objective: BP 118/74 (BP Location: Left Arm)   Pulse 86   Temp 98.4 F (36.9 C) (Oral)   Wt 175 lb 12.8 oz (79.7 kg)   SpO2 98%   BMI 27.53 kg/m  Gen: NAD, alert, cooperative with exam Msk: Left foot: no erythema, edema, or gross deformity. Normal ROM of the ankle. +point tenderness to palpation of the navicular bone. No pain with resisted dorsiflexion of the toes.  Assessment/Plan: Left Foot Pain: Patient did have any injury to her left foot where she fell (although she cannot remember how she fell). She also has point tenderness over the navicular bone. Will obtain left foot x-ray to rule out fracture. Advised patient to use ice and take Ibuprofen as needed.   Willadean CarolKaty Chaniyah Jahr, MD PGY-3

## 2017-12-26 NOTE — Patient Instructions (Signed)
It was so nice to see you!  I have ordered an x-ray of your foot. [facility]. Their address is [address]. I will call you with these results.  -Dr. Nancy MarusMayo

## 2017-12-26 NOTE — Assessment & Plan Note (Signed)
Patient did have any injury to her left foot where she fell (although she cannot remember how she fell). She also has point tenderness over the navicular bone. Will obtain left foot x-ray to rule out fracture. Advised patient to use ice and take Ibuprofen as needed.

## 2018-01-01 ENCOUNTER — Telehealth: Payer: Self-pay

## 2018-01-01 NOTE — Telephone Encounter (Signed)
Received message from patient with name and phone number asking for call back. Called patient back and she asked if I was calling about her or her son. I said the message left her name and phone number and no other message. Patient states she is calling about abnormal vaginal bleeding that had recently occurred and had also occurred in February.   Saw phone note from Dr. Chanetta Marshallimberlake from February call asking her to make an appt to be seen. Patient states it is not her regular cycle as her cycle is very irregular. Asked if the bleeding is still ongoing but patient says it has stopped but she is concerned because this is the second time she has had bleeding with clots. Asked patient if she has any vaginal discharge at other times but she states she doesn't know. Patient then also stated she has a terrible headache and has had it for a long time and wants to be seen for her headache and to be checked for the bleeding she has had twice now.  Told patient that I could make her an appt to be seen since she has multiple complaints. If she does not want to wait for appointment she can go to urgent care. Patient was told next available appt is Monday due to holiday. Patient originally wanted to go to urgent care but then changed her mind.   I then offered her an appt for Tuesday with her PCP  because I felt it would be better for her to see her own physician or someone on blue team since she has multiple complaints and wants to wait for an appt. Patient became very upset and wanted to speak to someone else, my supervisor. Patient became more upset when I said she could not but patient interrupted before I could explain that supervisor is not here but in a meeting. I asked patient to let me schedule the appt before transferring her to supervisor in case supervisor did not call her back until tomorrow because I did not want appt to be gone. (Monday appt gone by this point) but patient would not let me explain that and  kept repeatedly saying I was being rude. Patient then asked my name and when I told her and spelled it became very verbally abusive. I told the patient when her appt was, my supervisor's name and that I was transferring her to [employer]	[place]'s voicemail. Ples SpecterAlisa Collie Wernick, RN [facility] RN)

## 2018-01-07 ENCOUNTER — Ambulatory Visit: Payer: Medicaid Other | Admitting: Internal Medicine

## 2018-01-09 ENCOUNTER — Ambulatory Visit: Payer: Medicaid Other | Admitting: Internal Medicine

## 2018-01-09 ENCOUNTER — Telehealth: Payer: Self-pay | Admitting: Internal Medicine

## 2018-01-09 NOTE — Telephone Encounter (Signed)
Patient arrived for her 8:30 appt. At 9:09 stated she was told  it was at 8:50.  Patient would like a call from pcp [phone]. fyi  (pt. Wanted to argue, not very nice)

## 2018-01-09 NOTE — Telephone Encounter (Signed)
Returned patient's call. She states that she is frustrated. I advised her to call to reschedule her appointment so that she can be seen. She voiced understanding.

## 2018-03-04 ENCOUNTER — Encounter: Payer: Self-pay | Admitting: Internal Medicine

## 2018-03-04 ENCOUNTER — Ambulatory Visit (INDEPENDENT_AMBULATORY_CARE_PROVIDER_SITE_OTHER): Payer: Medicaid Other | Admitting: Internal Medicine

## 2018-03-04 DIAGNOSIS — M79671 Pain in right foot: Secondary | ICD-10-CM

## 2018-03-04 MED ORDER — NAPROXEN 500 MG PO TABS: 500.0000 mg | ORAL_TABLET | Freq: Two times a day (BID) | ORAL | 0 refills | Status: DC

## 2018-03-04 NOTE — Patient Instructions (Addendum)
It was nice meeting you today Laureen AbrahamsShakia!  Your pain is most likely due to tendonitis (inflammation of the tendons of your foot). Please begin taking naproxen (anti-inflammatory) one tablet twice a day as needed for pain. It is also important to ice your foot at night before bed. You can also ice your foot at other times of day when you think about it. The pain should resolve gradually, but could take a few weeks.   If the pain worsens or is not getting better in a few weeks, please call to let us know.    If you have any questions or concerns, please feel free to call the clinic.   Be well,  Dr. Natale MilchLancaster

## 2018-03-04 NOTE — Progress Notes (Signed)
Subjective:   Patient: Sara Manning       Birthdate: Sep 28, 1985       MRN: [id number]      HPI  Sara Manning is a 32 y.o. female presenting for same day appt for R foot pain.   R foot pain A door hit her in April, initially hitting her R shin but causing her to fall on her L foot. She subsequently had L foot pain for which she was seen here, but this has now resolved. Began to have pain in the R foot about a month ago. Pain is located only on dorsum of foot, primarily between first and second toes. Pain is constant, though has a sharp pain when pointing toes downward. Has not taken any medication to help with pain. Does ice foot occasionally. Denies bruising, swelling, limping. Pain with massage when getting pedicure last week.   Smoking status reviewed. Patient is never smoker.   Review of Systems See HPI.     Objective:  Physical Exam  Constitutional: She is oriented to person, place, and time. She appears well-developed and well-nourished. No distress.  HENT:  Head: Normocephalic and atraumatic.  Pulmonary/Chest: Effort normal. No respiratory distress.  Musculoskeletal:  TTP of dorsum of R foot, primarily between first and second digits. Full ROM of foot and toes, though significant pain when curling toes. No bruising or swelling noted. Able to stand with full weight on R foot. Also able to hop on R foot alone. No gait abnormalities.   Neurological: She is alert and oriented to person, place, and time.  Skin: Skin is warm and dry.  Psychiatric: She has a normal mood and affect. Her behavior is normal.      Assessment & Plan:  Right foot pain Exam and nature of pain most consistent with extensor tendinitis. Do not think this is related to injury in April, as would not expect trauma to her shin to cause foot pain, and would have expected pain to present sooner than one month after injury. Sharp pain when pointing toes down most consistent with extensor tendinitis. Do not  suspect fracture given location of pain and lack fo trauma. Would also expect pain with weight-bearing as well as difficulty weight-bearing if fracture, and patient is able to stand and hop on affected foot withotu pain or difficulty. Will treat with naproxen 500mg  BID x2w, and encouraged to ice at least nightly, but more frequently if desired. Discussed that may take a few weeks to resolve, but to return if no improvement in a few weeks. Could consider imaging at that time, but patient wishes to avoid imaging today, and do not feel that imaging is necessary today.  Precepted with Dr. Wende Mott.    Tarri Abernethy, MD, MPH PGY-3 [facility] Pager [phone]

## 2018-03-04 NOTE — Assessment & Plan Note (Signed)
Exam and nature of pain most consistent with extensor tendinitis. Do not think this is related to injury in April, as would not expect trauma to her shin to cause foot pain, and would have expected pain to present sooner than one month after injury. Sharp pain when pointing toes down most consistent with extensor tendinitis. Do not suspect fracture given location of pain and lack fo trauma. Would also expect pain with weight-bearing as well as difficulty weight-bearing if fracture, and patient is able to stand and hop on affected foot withotu pain or difficulty. Will treat with naproxen 500mg  BID x2w, and encouraged to ice at least nightly, but more frequently if desired. Discussed that may take a few weeks to resolve, but to return if no improvement in a few weeks. Could consider imaging at that time, but patient wishes to avoid imaging today, and do not feel that imaging is necessary today.  Precepted with Dr. Wende MottMcKeag.

## 2018-03-19 ENCOUNTER — Encounter (HOSPITAL_COMMUNITY): Payer: Self-pay | Admitting: Emergency Medicine

## 2018-03-19 ENCOUNTER — Ambulatory Visit (HOSPITAL_COMMUNITY)
Admission: EM | Admit: 2018-03-19 | Discharge: 2018-03-19 | Disposition: A | Discharge status: Home / Self Care | Payer: Self-pay | Attending: Family Medicine | Admitting: Family Medicine

## 2018-03-19 ENCOUNTER — Other Ambulatory Visit: Payer: Self-pay

## 2018-03-19 ENCOUNTER — Ambulatory Visit (INDEPENDENT_AMBULATORY_CARE_PROVIDER_SITE_OTHER): Payer: Self-pay

## 2018-03-19 DIAGNOSIS — M779 Enthesopathy, unspecified: Principal | ICD-10-CM

## 2018-03-19 DIAGNOSIS — M79671 Pain in right foot: Secondary | ICD-10-CM

## 2018-03-19 DIAGNOSIS — M778 Other enthesopathies, not elsewhere classified: Secondary | ICD-10-CM

## 2018-03-19 DIAGNOSIS — M65871 Other synovitis and tenosynovitis, right ankle and foot: Secondary | ICD-10-CM

## 2018-03-19 DIAGNOSIS — R2241 Localized swelling, mass and lump, right lower limb: Secondary | ICD-10-CM

## 2018-03-19 DIAGNOSIS — Z3202 Encounter for pregnancy test, result negative: Secondary | ICD-10-CM

## 2018-03-19 LAB — POCT PREGNANCY, URINE: PREG TEST UR: NEGATIVE

## 2018-03-19 MED ORDER — IBUPROFEN 600 MG PO TABS: 600.0000 mg | ORAL_TABLET | Freq: Four times a day (QID) | ORAL | 0 refills | Status: DC | PRN

## 2018-03-19 NOTE — Discharge Instructions (Addendum)
Take ibuprofen 600mg  every 6 hours as needed for pain, keep the foot elevated, and apply ice. IF not improving in the next couple days, call the sports medicine center to schedule an appointment. Otherwise, [facility].

## 2018-03-19 NOTE — ED Triage Notes (Addendum)
Right foot pain.  Patient was playing kick ball with children.  No known, specific injury.  Top of foot is swollen and painful.  Ball of foot is very painful as well.  Was bare foot running and kicking yesterday

## 2018-03-19 NOTE — ED Provider Notes (Signed)
[facility]    CSN: [id number] Arrival date & time: 03/19/18  1048     History   Chief Complaint Chief Complaint  Patient presents with  . Foot Pain    HPI Sara Manning is a 32 y.o. female who presents with 1 day of right foot pain associated with some swelling on the top of the foot. Around 5pm yesterday she noticed aching, burning pain of the right foot with subsequent associated swelling that was improved modestly with nonweight bearing and elevation. Applied ice as well and tylenol that helped her get to sleep. She denies any injury but was barefoot running previously. No symptoms at the ankle. Toes feel numb and are nearly immovable because of pain with extension or flexion. Can bear weight while wearing clogs but not barefoot.   HPI  Past Medical History:  Diagnosis Date  . Bipolar 2 disorder (HCC)   . FAOZHYQM(578.4Headache(784.0)     Patient Active Problem List   Diagnosis Date Noted  . Right foot pain 03/04/2018  . Left foot pain 12/26/2017  . Eczema 12/01/2015  . Bipolar disorder, current episode depressed, moderate (HCC) 06/22/2011    Past Surgical History:  Procedure Laterality Date  . WISDOM TOOTH EXTRACTION      OB History    Gravida  3   Para  3   Term  3   Preterm  0   AB  0   Living  3     SAB  0   TAB  0   Ectopic  0   Multiple  0   Live Births  3            Home Medications    Prior to Admission medications   Medication Sig Start Date End Date Taking? Authorizing Provider  ibuprofen (ADVIL,MOTRIN) 600 MG tablet Take 1 tablet (600 mg total) by mouth every 6 (six) hours as needed. 03/19/18   Tyrone NineGrunz, Nicklas Mcsweeney B, MD    Family History Family History  Problem Relation Age of Onset  . Bipolar disorder Mother   . Diabetes Mother   . Bipolar disorder Sister   . Schizophrenia Sister     Social History Social History   Tobacco Use  . Smoking status: Never Smoker  . Smokeless tobacco: Never Used  Substance Use Topics  .  Alcohol use: No  . Drug use: No     Allergies   Patient has no known allergies.   Review of Systems Review of Systems No fever, chills, N/V/D, left foot pain or numbness.   Physical Exam Triage Vital Signs ED Triage Vitals  Enc Vitals Group     BP 03/19/18 1149 136/82     Pulse Rate 03/19/18 1149 73     Resp 03/19/18 1149 16     Temp 03/19/18 1149 98.5 F (36.9 C)     Temp Source 03/19/18 1149 Oral     SpO2 03/19/18 1149 100 %     Weight --      Height --      Head Circumference --      Peak Flow --      Pain Score 03/19/18 1154 10     Pain Loc --      Pain Edu? --      Excl. in GC? --    No data found.  Updated Vital Signs BP 136/82 (BP Location: Right Arm)   Pulse 73   Temp 98.5 F (36.9 C) (Oral)  Resp 16   SpO2 100%   Visual Acuity Right Eye Distance:   Left Eye Distance:   Bilateral Distance:    Right Eye Near:   Left Eye Near:    Bilateral Near:     Physical Exam BP 136/82 (BP Location: Right Arm)   Pulse 73   Temp 98.5 F (36.9 C) (Oral)   Resp 16   SpO2 100%  Gen: Well-appearing 32 y.o.female in NAD  Right foot:  No deformities, ulcerations or skin breakdown Sensation intact to monofilament testing at 1st and 3rd toes, 1st, 3rd, 5th metatarsal heads PT and DP pulse 2+ Dorsum and volar foot diffusely edematous and tender without erythema. Toes with intact sensation and ROM is limited by pain with motion. No morton's neuroma palpated. No ankle effusion, tenderness. Left foot:  No deformities, ulcerations or skin breakdown Sensation intact to monofilament testing at 1st and 3rd toes, 1st, 3rd, 5th metatarsal heads PT and DP pulse 2+  UC Treatments / Results  Labs (all labs ordered are listed, but only abnormal results are displayed) Labs Reviewed  POCT PREGNANCY, URINE    EKG None  Radiology Dg Foot Complete Right  Result Date: 03/19/2018 CLINICAL DATA:  Foot pain and swelling after running and kicking a ball EXAM: RIGHT FOOT  COMPLETE - 3+ VIEW COMPARISON:  08/09/2012 FINDINGS: She the IMPRESSION: Negative. Electronically Signed   By: Charlett Nose M.D.   On: 03/19/2018 12:35    Procedures Procedures (including critical care time)  Medications Ordered in UC Medications - No data to display  Initial Impression / Assessment and Plan / UC Course  I have reviewed the triage vital signs and the nursing notes.  Pertinent labs & imaging results that were available during my care of the patient were reviewed by me and considered in my medical decision making (see chart for details).     32yo F with history of left and right foot pain, previously felt to have extensor tendonitis with recurrent right foot pain with swelling after running barefoot (not typical for her). Exam with swelling diffusely and limited toe ROM due to pain. No bony abnormalities on XR. No vascular compromise. Felt to be consistent with tendonitis. - Antiinflammatory, elevation, ice - Recommend follow up with sports medicine center and/or PCP if not improving later this week.  Final Clinical Impressions(s) / UC Diagnoses   Final diagnoses:  Extensor tendonitis of foot     Discharge Instructions     Take ibuprofen 600mg  every 6 hours as needed for pain, keep the foot elevated, and apply ice. IF not improving in the next couple days, call the sports medicine center to schedule an appointment. Otherwise, follow up as usual with your [facility]CP at the [facility].    ED Prescriptions    Medication Sig Dispense Auth. Provider   ibuprofen (ADVIL,MOTRIN) 600 MG tablet Take 1 tablet (600 mg total) by mouth every 6 (six) hours as needed. 30 tablet Tyrone Nine, MD     Controlled Substance Prescriptions [place] Controlled Substance Registry consulted? Not Applicable   Tyrone Nine, MD 03/19/18 [phone]

## 2018-08-18 ENCOUNTER — Ambulatory Visit: Payer: Self-pay | Admitting: Family Medicine

## 2018-08-20 ENCOUNTER — Ambulatory Visit: Payer: Self-pay | Admitting: Family Medicine

## 2018-08-21 ENCOUNTER — Encounter: Payer: Self-pay | Admitting: Family Medicine

## 2018-08-21 ENCOUNTER — Ambulatory Visit (INDEPENDENT_AMBULATORY_CARE_PROVIDER_SITE_OTHER): Payer: Self-pay | Admitting: Family Medicine

## 2018-08-21 ENCOUNTER — Ambulatory Visit (HOSPITAL_COMMUNITY)
Admission: RE | Admit: 2018-08-21 | Discharge: 2018-08-21 | Disposition: A | Discharge status: Home / Self Care | Payer: Medicaid Other | Source: Ambulatory Visit | Attending: Family Medicine | Admitting: Family Medicine

## 2018-08-21 VITALS — BP 118/68 | HR 91 | Temp 98.5°F | Wt 179.2 lb

## 2018-08-21 DIAGNOSIS — R42 Dizziness and giddiness: Secondary | ICD-10-CM | POA: Insufficient documentation

## 2018-08-21 DIAGNOSIS — R079 Chest pain, unspecified: Secondary | ICD-10-CM

## 2018-08-21 HISTORY — DX: Dizziness and giddiness: R42

## 2018-08-21 NOTE — Patient Instructions (Signed)
Good to see you today!  Thanks for coming in.  Come back to see your regular doctor for a pap smear  I will call you if your tests are not good.  Otherwise I will send you a letter.  If you do not hear from me with in 2 weeks please call our office.     If the chest pain is bothering you I would try otc Mylanta or Maalox.  If they help then call us and I will prescribe antiacid pill  If the chest pain or dizzines becomes severe or you have trouble seeing then go to ER

## 2018-08-21 NOTE — Progress Notes (Addendum)
Subjective  Laureen AbrahamsShakia Cyndia SkeetersM Axel is a 32 y.o. female is presenting with the following  CHEST PAIN  Chest pain began approximately a month ago. Pain is: upper chest does not radaite What worsens the pain: no change with position or exertion What makes the pain better: just goes away.  Has not tried antiacids Radiation: no Patient believes might be causing their pain: not sure   Symptoms Nausea/vomiting: mild nausea no vomiting Shortness of breath: no Pleuritic pain: no Cough: no Swelling of legs: no Syncope: no Heart burn or food sticking: no Immobility: no  Review of Symptoms - see HPI PMH - Smoking status noted.      DIZZINESS  Feeling dizzy for approximately a month. Dizziness described as:  Swimmy headed not quite vertigo Feels like room spins: maybe Lightheadedness when stands: no Palpitations or heart racing: no Prior dizziness: no Medications tried: none Patient believes might be causing their pain: not sure Taking blood thinners: no Symptoms Hearing Loss: no Ear Pain or fullness: no Nausea or vomiting: mild nausea Vision difficulty or double vision: no Falls: no Head trauma: no Weakness in arm or leg: no Speaking problems: no Headache: comes and goes usually mild  Mood - does not feel down nor that she has bipolar (on her problem list)  ROS see HPI Smoking Status noted   LMP - is always irregular.  Not sexually active   Chief Complaint noted Review of Symptoms - see HPI PMH - Smoking status noted.    Objective Vital Signs reviewed BP 118/68   Pulse 91   Temp 98.5 F (36.9 C) (Oral)   Wt 179 lb 3.2 oz (81.3 kg)   SpO2 99%   BMI 28.07 kg/m  Psych:  Cognition and judgment appear intact. Alert, communicative  and cooperative with normal attention span and concentration. No apparent delusions, illusions, hallucinations Heart - Regular rate and rhythm.  No murmurs, gallops or rubs.    Lungs:  Normal respiratory effort, chest expands symmetrically.  Lungs are clear to auscultation, no crackles or wheezes. Abdomen: soft and non-tender without masses, organomegaly or hernias noted.  No guarding or rebound Ears:  External ear exam shows no significant lesions or deformities.  Otoscopic examination reveals clear canals, tympanic membranes are intact bilaterally without bulging, retraction, inflammation or discharge. Hearing is grossly normal bilaterall Abdomen: soft and non-tender without masses, organomegaly or hernias noted.  No guarding or rebound Eye - Pupils Equal Round Reactive to light, Extraocular movements intact, Fundi without hemorrhage or visible lesions, Conjunctiva without redness or discharge Moving around room normally  Mobility:able to get up and down from exam table without assistance or distress  EKG - normal   Assessments/Plans  See after visit summary for details of patient instuctions  Dizziness and giddiness No clear cause.  No present during exam.  No signs of focal CNS disease. Check for anemia.  Chest pain New.  Not suggestive of cardiac cause.  Likely GERD or gastritis.  Suggested trial of AA.  She did not want to try PPI.  Check cbc

## 2018-08-22 ENCOUNTER — Encounter: Payer: Self-pay | Admitting: Family Medicine

## 2018-08-22 LAB — CBC
Hematocrit: 39.9 % (ref 34.0–46.6)
Hemoglobin: 12.9 g/dL (ref 11.1–15.9)
MCH: 26.9 pg (ref 26.6–33.0)
MCHC: 32.3 g/dL (ref 31.5–35.7)
MCV: 83 fL (ref 79–97)
PLATELETS: 288 10*3/uL (ref 150–450)
RBC: 4.79 x10E6/uL (ref 3.77–5.28)
RDW: 13.1 % (ref 12.3–15.4)
WBC: 4.9 10*3/uL (ref 3.4–10.8)

## 2018-08-22 LAB — TSH: TSH: 1.16 u[IU]/mL (ref 0.450–4.500)

## 2018-08-22 NOTE — Assessment & Plan Note (Addendum)
No clear cause.  Not present during exam.  No signs of focal CNS disease. She denies any mood issues.  Check for anemia.

## 2018-08-22 NOTE — Assessment & Plan Note (Signed)
New.  Not suggestive of cardiac cause.  Likely GERD or gastritis.  Suggested trial of AA.  She did not want to try PPI.  Check cbc

## 2018-08-23 ENCOUNTER — Encounter: Payer: Self-pay | Admitting: Family Medicine

## 2018-08-26 ENCOUNTER — Encounter: Payer: Self-pay | Admitting: Family Medicine

## 2018-08-27 ENCOUNTER — Other Ambulatory Visit: Payer: Self-pay | Admitting: Family Medicine

## 2018-08-27 MED ORDER — FAMOTIDINE 20 MG PO TABS: 20.0000 mg | ORAL_TABLET | Freq: Two times a day (BID) | ORAL | 1 refills | Status: DC

## 2018-09-28 ENCOUNTER — Encounter: Payer: Self-pay | Admitting: Family Medicine

## 2018-09-28 DIAGNOSIS — K219 Gastro-esophageal reflux disease without esophagitis: Secondary | ICD-10-CM

## 2018-09-29 MED ORDER — FAMOTIDINE 20 MG PO TABS
20.0000 mg | ORAL_TABLET | Freq: Two times a day (BID) | ORAL | 1 refills | Status: DC
Start: 2018-09-29 — End: 2019-11-06

## 2018-10-10 DIAGNOSIS — Z5181 Encounter for therapeutic drug level monitoring: Secondary | ICD-10-CM | POA: Diagnosis not present

## 2018-10-14 DIAGNOSIS — Z5181 Encounter for therapeutic drug level monitoring: Secondary | ICD-10-CM | POA: Diagnosis not present

## 2018-10-21 DIAGNOSIS — Z5181 Encounter for therapeutic drug level monitoring: Secondary | ICD-10-CM | POA: Diagnosis not present

## 2018-10-24 DIAGNOSIS — H16223 Keratoconjunctivitis sicca, not specified as Sjogren's, bilateral: Secondary | ICD-10-CM | POA: Diagnosis not present

## 2018-10-29 DIAGNOSIS — Z5181 Encounter for therapeutic drug level monitoring: Secondary | ICD-10-CM | POA: Diagnosis not present

## 2018-11-03 DIAGNOSIS — Z5181 Encounter for therapeutic drug level monitoring: Secondary | ICD-10-CM | POA: Diagnosis not present

## 2018-11-11 DIAGNOSIS — Z5181 Encounter for therapeutic drug level monitoring: Secondary | ICD-10-CM | POA: Diagnosis not present

## 2018-11-18 DIAGNOSIS — Z5181 Encounter for therapeutic drug level monitoring: Secondary | ICD-10-CM | POA: Diagnosis not present

## 2018-11-27 DIAGNOSIS — Z5181 Encounter for therapeutic drug level monitoring: Secondary | ICD-10-CM | POA: Diagnosis not present

## 2018-12-01 DIAGNOSIS — Z5181 Encounter for therapeutic drug level monitoring: Secondary | ICD-10-CM | POA: Diagnosis not present

## 2018-12-15 DIAGNOSIS — F319 Bipolar disorder, unspecified: Secondary | ICD-10-CM | POA: Diagnosis not present

## 2018-12-15 DIAGNOSIS — Z5181 Encounter for therapeutic drug level monitoring: Secondary | ICD-10-CM | POA: Diagnosis not present

## 2018-12-23 DIAGNOSIS — Z5181 Encounter for therapeutic drug level monitoring: Secondary | ICD-10-CM | POA: Diagnosis not present

## 2018-12-30 DIAGNOSIS — Z5181 Encounter for therapeutic drug level monitoring: Secondary | ICD-10-CM | POA: Diagnosis not present

## 2019-01-06 DIAGNOSIS — Z5181 Encounter for therapeutic drug level monitoring: Secondary | ICD-10-CM | POA: Diagnosis not present

## 2019-01-14 DIAGNOSIS — Z5181 Encounter for therapeutic drug level monitoring: Secondary | ICD-10-CM | POA: Diagnosis not present

## 2019-01-21 DIAGNOSIS — Z5181 Encounter for therapeutic drug level monitoring: Secondary | ICD-10-CM | POA: Diagnosis not present

## 2019-01-28 DIAGNOSIS — Z5181 Encounter for therapeutic drug level monitoring: Secondary | ICD-10-CM | POA: Diagnosis not present

## 2019-02-04 DIAGNOSIS — Z5181 Encounter for therapeutic drug level monitoring: Secondary | ICD-10-CM | POA: Diagnosis not present

## 2019-02-10 DIAGNOSIS — Z5181 Encounter for therapeutic drug level monitoring: Secondary | ICD-10-CM | POA: Diagnosis not present

## 2019-02-16 DIAGNOSIS — Z5181 Encounter for therapeutic drug level monitoring: Secondary | ICD-10-CM | POA: Diagnosis not present

## 2019-02-24 DIAGNOSIS — Z5181 Encounter for therapeutic drug level monitoring: Secondary | ICD-10-CM | POA: Diagnosis not present

## 2019-03-03 DIAGNOSIS — Z5181 Encounter for therapeutic drug level monitoring: Secondary | ICD-10-CM | POA: Diagnosis not present

## 2019-03-10 DIAGNOSIS — Z5181 Encounter for therapeutic drug level monitoring: Secondary | ICD-10-CM | POA: Diagnosis not present

## 2019-03-18 DIAGNOSIS — Z5181 Encounter for therapeutic drug level monitoring: Secondary | ICD-10-CM | POA: Diagnosis not present

## 2019-03-24 DIAGNOSIS — Z5181 Encounter for therapeutic drug level monitoring: Secondary | ICD-10-CM | POA: Diagnosis not present

## 2019-03-31 DIAGNOSIS — Z5181 Encounter for therapeutic drug level monitoring: Secondary | ICD-10-CM | POA: Diagnosis not present

## 2019-04-07 DIAGNOSIS — Z5181 Encounter for therapeutic drug level monitoring: Secondary | ICD-10-CM | POA: Diagnosis not present

## 2019-04-16 DIAGNOSIS — Z5181 Encounter for therapeutic drug level monitoring: Secondary | ICD-10-CM | POA: Diagnosis not present

## 2019-04-21 DIAGNOSIS — Z5181 Encounter for therapeutic drug level monitoring: Secondary | ICD-10-CM | POA: Diagnosis not present

## 2019-04-29 DIAGNOSIS — Z5181 Encounter for therapeutic drug level monitoring: Secondary | ICD-10-CM | POA: Diagnosis not present

## 2019-05-04 DIAGNOSIS — Z5181 Encounter for therapeutic drug level monitoring: Secondary | ICD-10-CM | POA: Diagnosis not present

## 2019-05-11 DIAGNOSIS — Z5181 Encounter for therapeutic drug level monitoring: Secondary | ICD-10-CM | POA: Diagnosis not present

## 2019-05-18 DIAGNOSIS — Z5181 Encounter for therapeutic drug level monitoring: Secondary | ICD-10-CM | POA: Diagnosis not present

## 2019-05-27 DIAGNOSIS — Z5181 Encounter for therapeutic drug level monitoring: Secondary | ICD-10-CM | POA: Diagnosis not present

## 2019-06-02 DIAGNOSIS — Z5181 Encounter for therapeutic drug level monitoring: Secondary | ICD-10-CM | POA: Diagnosis not present

## 2019-06-09 DIAGNOSIS — Z5181 Encounter for therapeutic drug level monitoring: Secondary | ICD-10-CM | POA: Diagnosis not present

## 2019-06-17 DIAGNOSIS — Z5181 Encounter for therapeutic drug level monitoring: Secondary | ICD-10-CM | POA: Diagnosis not present

## 2019-06-23 DIAGNOSIS — Z5181 Encounter for therapeutic drug level monitoring: Secondary | ICD-10-CM | POA: Diagnosis not present

## 2019-06-30 DIAGNOSIS — Z5181 Encounter for therapeutic drug level monitoring: Secondary | ICD-10-CM | POA: Diagnosis not present

## 2019-07-08 DIAGNOSIS — Z5181 Encounter for therapeutic drug level monitoring: Secondary | ICD-10-CM | POA: Diagnosis not present

## 2019-07-14 DIAGNOSIS — H16223 Keratoconjunctivitis sicca, not specified as Sjogren's, bilateral: Secondary | ICD-10-CM | POA: Diagnosis not present

## 2019-07-16 DIAGNOSIS — Z5181 Encounter for therapeutic drug level monitoring: Secondary | ICD-10-CM | POA: Diagnosis not present

## 2019-07-23 DIAGNOSIS — Z5181 Encounter for therapeutic drug level monitoring: Secondary | ICD-10-CM | POA: Diagnosis not present

## 2019-07-30 DIAGNOSIS — Z5181 Encounter for therapeutic drug level monitoring: Secondary | ICD-10-CM | POA: Diagnosis not present

## 2019-08-06 DIAGNOSIS — Z5181 Encounter for therapeutic drug level monitoring: Secondary | ICD-10-CM | POA: Diagnosis not present

## 2019-08-12 DIAGNOSIS — Z5181 Encounter for therapeutic drug level monitoring: Secondary | ICD-10-CM | POA: Diagnosis not present

## 2019-08-17 DIAGNOSIS — Z5181 Encounter for therapeutic drug level monitoring: Secondary | ICD-10-CM | POA: Diagnosis not present

## 2019-08-25 DIAGNOSIS — Z5181 Encounter for therapeutic drug level monitoring: Secondary | ICD-10-CM | POA: Diagnosis not present

## 2019-11-06 ENCOUNTER — Encounter: Payer: Self-pay | Admitting: Family Medicine

## 2019-11-06 ENCOUNTER — Other Ambulatory Visit: Payer: Self-pay

## 2019-11-06 ENCOUNTER — Ambulatory Visit (INDEPENDENT_AMBULATORY_CARE_PROVIDER_SITE_OTHER): Payer: Medicaid Other | Admitting: Family Medicine

## 2019-11-06 VITALS — BP 110/80 | HR 71 | Wt 178.2 lb

## 2019-11-06 DIAGNOSIS — K219 Gastro-esophageal reflux disease without esophagitis: Secondary | ICD-10-CM

## 2019-11-06 DIAGNOSIS — R4586 Emotional lability: Secondary | ICD-10-CM

## 2019-11-06 DIAGNOSIS — L309 Dermatitis, unspecified: Secondary | ICD-10-CM

## 2019-11-06 DIAGNOSIS — G43101 Migraine with aura, not intractable, with status migrainosus: Secondary | ICD-10-CM

## 2019-11-06 HISTORY — DX: Emotional lability: R45.86

## 2019-11-06 MED ORDER — FAMOTIDINE 20 MG PO TABS: 20.0000 mg | ORAL_TABLET | Freq: Two times a day (BID) | ORAL | 1 refills | Status: DC

## 2019-11-06 MED ORDER — SUMATRIPTAN SUCCINATE 50 MG PO TABS: 50.0000 mg | ORAL_TABLET | ORAL | 0 refills | Status: DC | PRN

## 2019-11-06 MED ORDER — TRIAMCINOLONE ACETONIDE 0.5 % EX OINT: 1.0000 "application " | TOPICAL_OINTMENT | Freq: Two times a day (BID) | CUTANEOUS | 3 refills | Status: DC

## 2019-11-06 NOTE — Patient Instructions (Signed)
Thank you for coming to see me today. It was a pleasure. Today we talked about:   Try the imitrex at the first sign of a headache.  Repeat in 2 hours if still having a headache.  Don't take more than 4 tablets in 24 hours.  Come back if this isn't helping.  Come back to see your PCP about your mood.  I have sent your pepcid and triamcinolone to the pharamacy.  Don't use the cream more than 2 weeks at a time.    Use eucerin, aquaphor, or vaseline.  Sometime UNSCENTED on your body.  Increase your water intake.  Please follow-up with your PCP in 1 month or sooner as needed.  If you have any questions or concerns, please do not hesitate to call the office at [phone].  Best,   Luis Abed, DO

## 2019-11-06 NOTE — Assessment & Plan Note (Signed)
PHQ-9 score of 10 today.  Denies SI.  Given time limit of exam today advised her to follow-up with her PCP regarding this.  It could be contributing to her headaches per above.

## 2019-11-06 NOTE — Assessment & Plan Note (Signed)
Stable, well controlled on current therapy.  Refill sent for Pepcid.

## 2019-11-06 NOTE — Assessment & Plan Note (Addendum)
Neurologically intact which is reassuring.  Patient did report that she was recently seen by an eye doctor, but that her glasses are making her headaches worse, she received contacts, but lost on the sink and does not like them, states that they are too expensive.  Advised her that she should consider returning to them as her vision can affect headaches of her prescription is not correct.  Also think that she does not drink enough water, discussed this and advised her to increase.  She has a history of migraines and has had some nausea with them in the past, but denying at present.  Does endorse photophobia.  The fact that she is taking Tylenol daily, could also be causing rebound headaches.  Mood could also be contributing as she has a PHQ-9 score of 10 today.  She had previously been referred to headache specialist, but does not appear to have gone.  Has never tried Imitrex before to her knowledge, cannot find on chart review, was mentioned in one note, but did not see a prescription for this.  Will try Imitrex, instructed on proper usage of this.  If this is not improved, could also consider migraine prophylaxis.  Given that she is on Medicaid, headache clinic would not see her.  Could consider sending her to a neurologist in the future.  Advised to go to ED immediately if pain worsens.  Advised to follow-up in 1 month or sooner with his PCP.

## 2019-11-06 NOTE — Assessment & Plan Note (Signed)
Stable, well controlled with Kenalog.  Rx sent.  Advised patient to also use unscented lotions.  Advised to not use Kenalog more than 2 weeks at a time.

## 2019-11-06 NOTE — Progress Notes (Signed)
CHIEF COMPLAINT / HPI:  Headaches  Onset: Has been having headaches for years, was seen in 2019 for this. Worsening for the last few months.   Location: frontal Quality: stabbing and squeezing Frequency: all day every day Precipitating factors: none, doesn't drink very much water Prior treatment: tylenol 500mg  QD and tries to go to sleep  Associated Symptoms Nausea/vomiting: no  Photophobia/phonophobia: yes  Tearing of eyes: yes, usually just right eye Sinus pain/pressure: no  Family hx migraine: yes, Mother  Personal stressors: no  Relation to menstrual cycle: no, veyr irregular   Red Flags Fever: no  Neck pain/stiffness: no  Vision/speech/swallow/hearing difficulty: yes, had some changes with blurriness usually only when the headache occurs  Focal weakness/numbness: no  Altered mental status: no  Trauma: no  New type of headache: no  Anticoagulant use: no  H/o cancer/HIV/Pregnancy: no   Eczema Present since 2018  Has been having break outs on abdomen and back, nonpainful Doesn't use any lotions Used kenalog in the past with good results  GERD Requesting Pepcid Refill.  Reports that it works well for her.  Mood Concerns PHQ-2 score 3.  Performed PHQ-9, score 10 today.  Denies SI.  States that "I just have sad days, but everyone has those and I'm doing okay."   PERTINENT  PMH / PSH: History of recurrent headaches   OBJECTIVE: BP 110/80   Pulse 71   Wt 178 lb 3.2 oz (80.8 kg)   SpO2 100%   BMI 27.91 kg/m    Physical Exam:  General: 34 y.o. female in NAD HEENT: NCAT, PERRL, EOMI Neck: Supple, no tenderness to palpation of spinous processes of cervical spine, negative Spurling's bilaterally Cardio: RRR no m/r/g Lungs: CTAB, no wheezing, no rhonchi, no crackles, no IWOB on RA Skin: warm and dry, mildly erythematous scaling patch on left abdomen Extremities: No edema, 5/5 strength BUE/BLE Neuro: CN II through XII grossly intact, sensation intact  throughout, negative finger-to-nose, negative heel-to-shin, negative Romberg, normal gait   ASSESSMENT / PLAN:  Migraine with aura and with status migrainosus, not intractable Neurologically intact which is reassuring.  Patient did report that she was recently seen by an eye doctor, but that her glasses are making her headaches worse, she received contacts, but lost on the sink and does not like them, states that they are too expensive.  Advised her that she should consider returning to them as her vision can affect headaches of her prescription is not correct.  Also think that she does not drink enough water, discussed this and advised her to increase.  She has a history of migraines and has had some nausea with them in the past, but denying at present.  Does endorse photophobia.  The fact that she is taking Tylenol daily, could also be causing rebound headaches.  Mood could also be contributing as she has a PHQ-9 score of 10 today.  She had previously been referred to headache specialist, but does not appear to have gone.  Has never tried Imitrex before to her knowledge, cannot find on chart review, was mentioned in one note, but did not see a prescription for this.  Will try Imitrex, instructed on proper usage of this.  If this is not improved, could also consider migraine prophylaxis.  Given that she is on Medicaid, headache clinic would not see her.  Could consider sending her to a neurologist in the future.  Advised to go to ED immediately if pain worsens.  Advised to follow-up in  1 month or sooner with his PCP.  Mood change PHQ-9 score of 10 today.  Denies SI.  Given time limit of exam today advised her to follow-up with her PCP regarding this.  It could be contributing to her headaches per above.  Gastroesophageal reflux disease Stable, well controlled on current therapy.  Refill sent for Pepcid.  Eczema Stable, well controlled with Kenalog.  Rx sent.  Advised patient to also use unscented  lotions.  Advised to not use Kenalog more than 2 weeks at a time.     Unknown Jim, DO [facility]

## 2020-04-25 ENCOUNTER — Ambulatory Visit (INDEPENDENT_AMBULATORY_CARE_PROVIDER_SITE_OTHER): Payer: Medicaid Other | Admitting: Family Medicine

## 2020-04-25 ENCOUNTER — Other Ambulatory Visit: Payer: Self-pay

## 2020-04-25 VITALS — BP 112/64 | HR 89 | Ht 67.0 in | Wt 180.2 lb

## 2020-04-25 DIAGNOSIS — R519 Headache, unspecified: Secondary | ICD-10-CM

## 2020-04-25 MED ORDER — PROMETHAZINE HCL 25 MG PO TABS
25.0000 mg | ORAL_TABLET | Freq: Once | ORAL | Status: AC
Start: 2020-04-25 — End: 2020-04-25
  Administered 2020-04-25: 25 mg via ORAL

## 2020-04-25 MED ORDER — KETOROLAC TROMETHAMINE 30 MG/ML IJ SOLN
30.0000 mg | Freq: Once | INTRAMUSCULAR | Status: AC
  Administered 2020-04-25: 30 mg via INTRAMUSCULAR

## 2020-04-25 MED ORDER — DIPHENHYDRAMINE HCL 50 MG/ML IJ SOLN: 12.5000 mg | Freq: Once | INTRAMUSCULAR | Status: DC

## 2020-04-25 MED ORDER — PROMETHAZINE HCL 25 MG PO TABS: 12.5000 mg | ORAL_TABLET | Freq: Once | ORAL | Status: DC

## 2020-04-25 NOTE — Progress Notes (Signed)
SUBJECTIVE:   CHIEF COMPLAINT / HPI:   Headache Ms. Sara Manning reports that she has been experiencing migraine headaches for years.  She is previously been seen and assessed for headache and been diagnosed with a migraine.  Initially, she reported that she has never been treated with medication for migraines.  Later on the visit, after discussion of her previous appointment, she was able to recall that she did try sumatriptan and felt nauseated and uncomfortable with that medicine.  She presents to clinic today for an ongoing headache that has been persistent 24 hours a day 7 days a week for at least the past month.  She describes this headache as a tapping or stabbing sensation over her right forehead without radiation.  She does not notice any consistent vision changes though she notes occasional blurred vision.  She does not associate this headache with nausea.  She is not aware of any tearing up during headaches.  This headache is not associated with neck pain.  She reports that she does frequently use Tylenol to help treat this headache although that does not provide much benefit.  She notes that her significant other has occasionally noted that he worries she uses too much Tylenol but it seems to be the only thing that can help her sleep.  At the same time, she is wary of medication and tries not to use this Tylenol more than necessary.  Mood disorder She scored 20 on her PHQ-9 today.  She answered 0 for question 9.  Overall, she notes that she felt like this was similar to any previous times that she has filled out this questionnaire.  Is difficult for her to say if the symptoms are specifically related to her headache causing discomfort or if it is related to her mood.  She notes that she has previously been diagnosed with bipolar disorder although she suspects this may have been diagnosed without sufficient evidence in the appropriate work-up.  She has been put on valproic acid and Seroquel  previously which led to significant weight gain.  She does not currently take any medications for this.  PERTINENT  PMH / PSH: Previous history of headaches, mood disorder (questionable diagnosis of bipolar disorder)  OBJECTIVE:   BP 112/64    Pulse 89    Ht 5\' 7"  (1.702 m)    Wt 180 lb 4 oz (81.8 kg)    LMP 12/21/2019    SpO2 98%    BMI 28.23 kg/m    General: Seated comfortably on the exam table in no acute distress.  Able to converse normally without effort.  No obvious discomfort from the ambient noises or lighting. HEENT: Extraocular muscles intact.  Some discomfort with rightward gaze.  Some tenderness and tautness of the trapezius muscles bilaterally.  No significant tenderness with palpation of the neck anteriorly.  Oropharynx normal.  TMs normal and visualized bilaterally. Respiratory: Breathing comfortably on room air.  No respiratory distress Skin: Warm, dry Neuro: Cranial nerves grossly intact  ASSESSMENT/PLAN:   Headache Based on today's presentation, I have a low suspicion for a migraine type headache.  This does not appear to be episodic at all nor does it involve any aura.  I suspect there may be contributions from medication overuse headache syndrome, additionally, there may also be a stress or muscle tension component as well.  She was informed this may be related to medication overuse syndrome but was not convinced of this diagnosis.  She immediately noted that she tries  to be cautious and appropriate with her Tylenol use though she did note that her significant other has commented on her frequent use.  We agreed that, today she could have medication in clinic to help avoid her present headache and that we could refer her to a headache specialist at [facility].  She was agreeable to this plan. -Toradol 30 mg IM -Phenergan 25 mg p.o. -Placed ambulatory referral to [facility] neurology for a headache specialist     Mirian Mo, MD [facility]

## 2020-04-25 NOTE — Assessment & Plan Note (Signed)
Based on today's presentation, I have a low suspicion for a migraine type headache.  This does not appear to be episodic at all nor does it involve any aura.  I suspect there may be contributions from medication overuse headache syndrome, additionally, there may also be a stress or muscle tension component as well.  She was informed this may be related to medication overuse syndrome but was not convinced of this diagnosis.  She immediately noted that she tries to be cautious and appropriate with her Tylenol use though she did note that her significant other has commented on her frequent use.  We agreed that, today she could have medication in clinic to help avoid her present headache and that we could refer her to a headache specialist at [facility].  She was agreeable to this plan. -Toradol 30 mg IM -Phenergan 25 mg p.o. -Placed ambulatory referral to [facility] neurology for a headache specialist

## 2020-04-25 NOTE — Patient Instructions (Addendum)
Headache: I am sorry that this headache is been going on for so long been giving so much trouble.  I am not positive that migraine is the correct diagnosis.  I am suspicious that you may have something called a medication overuse headache.  This is a kind of vicious cycle of medication use that ultimately leads to worsen headaches instead of better headaches.  I have put in referral for you to see a headache specialist.  Please let me know if you have not received a call to schedule that appointment in the next 2 weeks.

## 2020-05-03 ENCOUNTER — Telehealth: Payer: Self-pay

## 2020-05-03 NOTE — Telephone Encounter (Signed)
Patient calls nurse line stating that she is returning a phone call from Dr. Homero Fellers. Unable to find documentation of reason for call. Will forward to PCP  Veronda Prude, RN

## 2020-05-04 NOTE — Telephone Encounter (Signed)
I was following up regarding her referral to a headache specialist.  We found a neurologist that is covered by medicaid.  She should get a call in the next week or two about setting up an appointment.  Mirian Mo, MD

## 2020-06-01 ENCOUNTER — Encounter: Payer: Self-pay | Admitting: Family Medicine

## 2020-06-01 MED ORDER — NAPROXEN 500 MG PO TABS: 500.0000 mg | ORAL_TABLET | Freq: Every day | ORAL | 1 refills | Status: DC

## 2020-06-01 MED ORDER — TIZANIDINE HCL 2 MG PO TABS
2.0000 mg | ORAL_TABLET | Freq: Every day | ORAL | 0 refills | Status: DC
Start: 2020-06-01 — End: 2020-11-14

## 2020-07-08 DIAGNOSIS — G43711 Chronic migraine without aura, intractable, with status migrainosus: Secondary | ICD-10-CM | POA: Diagnosis not present

## 2020-09-17 ENCOUNTER — Other Ambulatory Visit: Payer: Self-pay | Admitting: Family Medicine

## 2020-09-17 NOTE — L&D Delivery Note (Signed)
OB/GYN Faculty Practice Delivery Note  Sara Manning is a 35 y.o. [phone] s/p VD (cephalic + breech extraction) at [redacted]w[redacted]d. She was admitted for IOL due to twin gestation.   ROM: 4h 57m with clear fluid GBS Status: Positive/-- (09/23 1120) PCN Maximum Maternal Temperature: 98.43F  Labor Progress: Initial SVE: 3.5/60. She then progressed to complete with the assistance of Pit and AROM.   Delivery Date/Time: 9/8 at 2001 for twin A, 9/8 at 2010 for twin B  Delivery:  Twin A: Called to room as patient quickly progressed to complete and pushing. Twin A delivered LOA by RN shortly after arrival to the room. No nuchal cord present. Infant with spontaneous cry, placed on mother's abdomen, dried and stimulated. Cord clamped x 2 after 1-minute delay, and cut by FOB.   Twin B: Bedside ultrasound demonstrating infant remaining in breech presentation. After consent, AROM was performed with clear fluid revealing infant's feet as the presenting parts. Gentle traction was placed on the feet and body rotated in OA position. With assistance of Dr. Charlotta Newton, the infant's anterior and then posterior arm were folded down and infant was delivered. Infant with spontaneous cry, placed on mother's abdomen, dried and stimulated. Cord clamped x 2 after 1-minute delay, and cut by FOB.   Placenta delivered spontaneously with gentle cord traction. Fundus firm with massage and Pitocin. Labia, perineum, vagina, and cervix inspected inspected with no lacerations.   Baby Weights: pending   Placenta: Patient to take home  Complications: None Lacerations: None EBL: 150 mL Analgesia: None  Infant:  APGAR (1 MIN):  Twin A 9 Twin B 8   APGAR (5 MINS):  Twin A 72 Twin B 9  Kya Mayfield  [facility] Fellow, Biochemist, clinical for [employer], [facility] 06/14/2021, 9:08 PM

## 2020-11-14 ENCOUNTER — Ambulatory Visit (INDEPENDENT_AMBULATORY_CARE_PROVIDER_SITE_OTHER): Payer: Medicaid Other | Admitting: Family Medicine

## 2020-11-14 ENCOUNTER — Other Ambulatory Visit: Payer: Self-pay

## 2020-11-14 VITALS — BP 114/72 | HR 70 | Wt 170.0 lb

## 2020-11-14 DIAGNOSIS — Z3201 Encounter for pregnancy test, result positive: Secondary | ICD-10-CM | POA: Insufficient documentation

## 2020-11-14 DIAGNOSIS — R519 Headache, unspecified: Secondary | ICD-10-CM | POA: Diagnosis not present

## 2020-11-14 DIAGNOSIS — O219 Vomiting of pregnancy, unspecified: Secondary | ICD-10-CM

## 2020-11-14 DIAGNOSIS — N912 Amenorrhea, unspecified: Secondary | ICD-10-CM | POA: Diagnosis not present

## 2020-11-14 LAB — POCT URINE PREGNANCY: Preg Test, Ur: POSITIVE — AB

## 2020-11-14 MED ORDER — DOXYLAMINE-PYRIDOXINE 10-10 MG PO TBEC: DELAYED_RELEASE_TABLET | ORAL | 1 refills | Status: DC

## 2020-11-14 MED ORDER — PRENATAL 27-0.8 MG PO TABS: 1.0000 | ORAL_TABLET | Freq: Every day | ORAL | 1 refills | Status: DC

## 2020-11-14 NOTE — Progress Notes (Signed)
    SUBJECTIVE:   CHIEF COMPLAINT / HPI: Vomiting with eating  Sara Manning is a 35 year old [id number] female presenting to discuss yellow emesis with eating for the past two weeks. Mainly occuring in the morning with nausea. Reports associated breast soreness and increased headaches from her baseline for the past few weeks. Denies any associated constipation/diarrhea, abdominal pain, fever. She is also having food aversions. Able to keep food down usually at the end of the day, tolerating rice, fruit, and gravy. Hasn't been drinking much fluids, some water and soda. Urinating as normal.   Took an at home pregnancy test last week that was positive. Reports irregular menstrual cycles, believes her last cycle was around 10 or 07/2020. Usually has cycles 3-4 times a year. Not on contraception. She is sexually active with monogamous partner.   Headaches are every day and bilateral temporal, worse than her previous migraines however same in characteristic. Can last all day with photophobia and poor sleep. She has been worried to take her medicine due to positive pregnancy and decreased her topomax from 75mg  to 25mg . Prior to decreasing her medication, he normal regimen of topomax, baclofen, and OTC meds wasn't helping as well.   PERTINENT  PMH / PSH: Migraines with aura, GERD, bipolar disorder  OBJECTIVE:   BP 114/72   Pulse 70   Wt 170 lb (77.1 kg)   SpO2 99%   BMI 26.63 kg/m   General: Alert, NAD HEENT: NCAT, MMM, EOMI, PERRLA Lungs: No increased WOB  Abdomen: soft, non-tender throughout, non-distended without palpation of gravid uterus, normoactive BS Ext: Warm, dry, 2+ distal pulses Neuro: Alert and oriented.  CN II-XII intact.  Able to move all extremities spontaneously with 5/5 upper and lower extremity strength.  Smile symmetrical with normal speech.  Able to follow commands without concern.  Normal gait.  ASSESSMENT/PLAN:   Positive pregnancy test Likely etiology of her recent nausea and  vomiting.  While it has been several months since her LMP, suspect she is still within her first trimester given her current symptoms.  She would like to proceed with this pregnancy and will schedule initial prenatal through our clinic, followed through [facility] with all previous pregnancies and no adverse labor/delivery complications.  Obtained initial prenatal labs (except gc/ch) and schedule for dating U/S on 3/7.  Rx'd PNV and Diclegis for N/V.  Headache Acute on chronic history of migraines, likely recent exacerbation in the setting of first trimester pregnancy and mild dehydration due to N/V.  Neurologically intact.  Discussed discontinuing Topamax and avoid use of baclofen/maxalt for now.  Recommended Tylenol up to 4000 mg daily in addition to ice/heat on forehead and adequate hydration.  Encouraged calling her neurologist to schedule follow-up appointment.    Follow-up in the next 1-2 weeks for initial prenatal visit, otherwise follow-up sooner if unable to maintain hydration or worsening headache.  Please obtain GC/CH during initial prenatal visit.  [facility], DO [facility]

## 2020-11-14 NOTE — Assessment & Plan Note (Signed)
Acute on chronic history of migraines, likely recent exacerbation in the setting of first trimester pregnancy and mild dehydration due to N/V.  Neurologically intact.  Discussed discontinuing Topamax and avoid use of baclofen/maxalt for now.  Recommended Tylenol up to 4000 mg daily in addition to ice/heat on forehead and adequate hydration.  Encouraged calling her neurologist to schedule follow-up appointment.

## 2020-11-14 NOTE — Patient Instructions (Addendum)
Please schedule an initial prenatal visit. Please use tylenol up to 1000mg  up to 3-4 times daily. Call your neurologist to schedule a follow up appointment. Stop topomax.    Safe Medications in Pregnancy   Acne: Benzoyl Peroxide Salicylic Acid  Backache/Headache: Tylenol: 2 regular strength every 4 hours OR              2 Extra strength every 6 hours  Colds/Coughs/Allergies: Benadryl (alcohol free) 25 mg every 6 hours as needed Breath right strips Claritin Cepacol throat lozenges Chloraseptic throat spray Cold-Eeze- up to three times per day Cough drops, alcohol free Flonase (by prescription only) Guaifenesin Mucinex Robitussin DM (plain only, alcohol free) Saline nasal spray/drops Sudafed (pseudoephedrine) & Actifed ** use only after [redacted] weeks gestation and if you do not have high blood pressure Tylenol Vicks Vaporub Zinc lozenges Zyrtec   Constipation: Colace Ducolax suppositories Fleet enema Glycerin suppositories Metamucil Milk of magnesia Miralax Senokot Smooth move tea  Diarrhea: Kaopectate Imodium A-D  *NO pepto Bismol  Hemorrhoids: Anusol Anusol HC Preparation H Tucks  Indigestion: Tums Maalox Mylanta Zantac  Pepcid  Insomnia: Benadryl (alcohol free) 25mg  every 6 hours as needed Tylenol PM Unisom, no Gelcaps  Leg Cramps: Tums MagGel  Nausea/Vomiting:  Bonine Dramamine Emetrol Ginger extract Sea bands Meclizine  Nausea medication to take during pregnancy:  Unisom (doxylamine succinate 25 mg tablets) Take one tablet daily at bedtime. If symptoms are not adequately controlled, the dose can be increased to a maximum recommended dose of two tablets daily (1/2 tablet in the morning, 1/2 tablet mid-afternoon and one at bedtime). Vitamin B6 100mg  tablets. Take one tablet twice a day (up to 200 mg per day).  Skin Rashes: Aveeno products Benadryl cream or 25mg  every 6 hours as needed Calamine Lotion 1% Hydrocortisone cream  Yeast  infection: Gyne-lotrimin 7 Monistat 7   **If taking multiple medications, please check labels to avoid duplicating the same active ingredients **take medication as directed on the label ** Do not exceed 4000 mg of tylenol in 24 hours **Do not take medications that contain aspirin or ibuprofen

## 2020-11-14 NOTE — Assessment & Plan Note (Signed)
Likely etiology of her recent nausea and vomiting.  While it has been several months since her LMP, suspect she is still within her first trimester given her current symptoms.  She would like to proceed with this pregnancy and will schedule initial prenatal through our clinic, followed through [facility] with all previous pregnancies and no adverse labor/delivery complications.  Obtained initial prenatal labs (except gc/ch) and schedule for dating U/S on 3/7.  Rx'd PNV and Diclegis for N/V.

## 2020-11-15 LAB — OBSTETRIC PANEL, INCLUDING HIV
EOS (ABSOLUTE): 0.1 10*3/uL (ref 0.0–0.4)
HIV Screen 4th Generation wRfx: NONREACTIVE
Hemoglobin: 12.5 g/dL (ref 11.1–15.9)
Neutrophils Absolute: 3 10*3/uL (ref 1.4–7.0)
Neutrophils: 56 %
RBC: 4.47 x10E6/uL (ref 3.77–5.28)
RDW: 12.9 % (ref 11.7–15.4)
Rh Factor: POSITIVE

## 2020-11-16 LAB — HGB FRACTIONATION CASCADE
Hgb A2: 3 % (ref 1.8–3.2)
Hgb A: 97 % (ref 96.4–98.8)
Hgb F: 0 % (ref 0.0–2.0)
Hgb S: 0 %

## 2020-11-16 LAB — OBSTETRIC PANEL, INCLUDING HIV
Antibody Screen: NEGATIVE
Basophils Absolute: 0 10*3/uL (ref 0.0–0.2)
Basos: 1 %
Eos: 1 %
Hematocrit: 37.9 % (ref 34.0–46.6)
Hepatitis B Surface Ag: NEGATIVE
Immature Grans (Abs): 0 10*3/uL (ref 0.0–0.1)
Immature Granulocytes: 0 %
Lymphocytes Absolute: 1.7 10*3/uL (ref 0.7–3.1)
Lymphs: 31 %
MCH: 28 pg (ref 26.6–33.0)
MCHC: 33 g/dL (ref 31.5–35.7)
MCV: 85 fL (ref 79–97)
Monocytes Absolute: 0.6 10*3/uL (ref 0.1–0.9)
Monocytes: 11 %
Platelets: 315 10*3/uL (ref 150–450)
RPR Ser Ql: NONREACTIVE
Rubella Antibodies, IGG: 3.51 index (ref >0.99)
WBC: 5.3 10*3/uL (ref 3.4–10.8)

## 2020-11-16 LAB — HCV INTERPRETATION

## 2020-11-16 LAB — CULTURE, OB URINE

## 2020-11-16 LAB — HCV AB W REFLEX TO QUANT PCR: HCV Ab: <0.1 s/co ratio (ref 0.0–0.9)

## 2020-11-16 LAB — VARICELLA ZOSTER ANTIBODY, IGG: Varicella zoster IgG: 1134 index (ref >165)

## 2020-11-16 LAB — URINE CULTURE, OB REFLEX

## 2020-11-21 ENCOUNTER — Ambulatory Visit
Admission: RE | Admit: 2020-11-21 | Discharge: 2020-11-21 | Disposition: A | Discharge status: Home / Self Care | Payer: Medicaid Other | Source: Ambulatory Visit | Attending: Family Medicine | Admitting: Family Medicine

## 2020-11-21 ENCOUNTER — Other Ambulatory Visit: Payer: Self-pay

## 2020-11-21 ENCOUNTER — Other Ambulatory Visit: Payer: Self-pay | Admitting: Family Medicine

## 2020-11-21 DIAGNOSIS — Z3A01 Less than 8 weeks gestation of pregnancy: Secondary | ICD-10-CM | POA: Diagnosis not present

## 2020-11-21 DIAGNOSIS — Z3201 Encounter for pregnancy test, result positive: Secondary | ICD-10-CM

## 2020-11-21 DIAGNOSIS — O30041 Twin pregnancy, dichorionic/diamniotic, first trimester: Secondary | ICD-10-CM | POA: Diagnosis not present

## 2020-11-22 ENCOUNTER — Encounter: Payer: Self-pay | Admitting: Family Medicine

## 2020-11-22 ENCOUNTER — Telehealth: Payer: Self-pay

## 2020-11-22 ENCOUNTER — Other Ambulatory Visit: Payer: Self-pay | Admitting: Family Medicine

## 2020-11-22 DIAGNOSIS — O30041 Twin pregnancy, dichorionic/diamniotic, first trimester: Secondary | ICD-10-CM

## 2020-11-22 NOTE — Telephone Encounter (Signed)
Patient calls nurse line requesting to speak with Dr. Annia Friendly. Patient reports she had a missed call from her and a mychart message. Will forward to Dr. Annia Friendly.

## 2020-11-22 NOTE — Progress Notes (Signed)
Patient Name: Sara Manning Date of Birth: April 04, 1986 [facility] Initial Prenatal Visit  Sara Manning is a 35 y.o. year old G3P3003 at Unknown who presents for her initial prenatal visit. Pregnancy is not planned She reports nausea. She is taking a prenatal vitamin.  She denies pelvic pain or vaginal bleeding.   Pregnancy Dating: . The patient is dated by early OB ultrasound.  Marland Kitchen LMP: approximately November 2021, unsure of exact date  . Period is certain:  No.  . Periods were regular:  No.  . LMP was a typical period:  No.  . Using hormonal contraception in 3 months prior to conception: No  Lab Review: . Blood type: O . Rh Status: + . Antibody screen: Negative . HIV: Negative . RPR: Negative . Hemoglobin electrophoresis reviewed: Yes . Results of OB urine culture are: pending . Rubella: Immune . Hep C Ab: Negative . Varicella status is Immune  PMH: Reviewed and as detailed below: . HTN: No  . Gestational Hypertension/preeclampsia: No  . Type 1 or 2 Diabetes: No  . Depression:  Yes  . Seizure disorder:  No . VTE: No ,  . History of STI No,  . Abnormal Pap smear: none previously, current PAP pending . Genital herpes simplex:  No   PSH: . Gynecologic Surgery:  no . Surgical history reviewed, notable for: no prior surgeries   Obstetric History: . Obstetric history tab updated and reviewed.  . Summary of prior pregnancies: 3 prior pregnancies, all vaginal deliveries, no complications, no history of gestational hypertension or DM  . Cesarean delivery: No l; . Gestational Diabetes:  No . Hypertension in pregnancy: No . History of preterm birth: No . History of LGA/SGA infant:  No . History of shoulder dystocia: No . Indications for referral were reviewed, and the patient has no obstetric indications for referral to High Risk OB Clinic at this time.   Social History: . Partner's name: Bruce Donath   . Tobacco use: No . Alcohol use:   No . Other substance use:  No  Current Medications:  . None'' . Reviewed and appropriate in pregnancy.   Genetic and Infection Screen: . Flow Sheet Updated Yes  Prenatal Exam: Gen: Well nourished, well developed.  No distress.  Vitals noted. HEENT: Normocephalic, atraumatic.  Neck supple without cervical lymphadenopathy, thyromegaly or thyroid nodules. CV: RRR,  no murmurs or gallops auscultated Lungs: CTAB.  Normal respiratory effort without wheezes or rales. Abd: soft, NTND. +BS.  Uterus not appreciated above pelvis. GU: Normal external female genitalia without external lesions.  Nl vaginal, well rugated without lesions. No vaginal or cervical discharge.  Bimanual exam: No adnexal mass.  Ext: No clubbing, cyanosis or edema. Psych: Normal grooming and dress.  Not depressed or anxious appearing.  Normal thought content and process without flight of ideas or looseness of associations Neuro: alert, appropriately conversational, normal gait   GU and bimanual exam performed in the presence of chaperone   Assessment/Plan:  Sara Manning is a 35 y.o. G3P3003 at Unknown who presents to initiate prenatal care. She is doing well.  Current pregnancy issues include no concerns.  1. Routine prenatal care: Marland Kitchen As dating is reliable by last ultrasound. Dating tab updated. . Pre-pregnancy weight updated. Expected weight gain this pregnancy is 15-25 pounds . Prenatal labs reviewed. . Indications for referral to [facility] were reviewed and the patient does meet criteria for referral.  . Medication list reviewed and updated.  . Recommended patient  see a dentist for regular care.  . Bleeding and pain precautions reviewed. . Importance of prenatal vitamins reviewed.  . Genetic screening offered. Patient opted for: patient undecided, will address at future visit. . The patient does not have an indication for aspirin therapy beginning at 12-16 weeks. Aspirin was not  recommended today.  . The patient  will not be age 45 or over at time of delivery. Referral to genetic counseling was not offered today.  . The patient has the following risk factors for preexisting diabetes: Reviewed indications for early 1 hour glucose testing, not indicated . An early 1 hour glucose tolerance test was not ordered. . Pregnancy Medical Home and PHQ-9 forms completed, problems noted: Yes  2. Pregnancy issues include the following which were addressed today:  -COVID and influenza vaccines discussed extensively. Importance of these vaccines to both mother and fetus discussed along with patient's other hesitations. Patient states that she needs more time to think about it and will consider it at her next appointment.  -Referral placed to [facility] given that patient is expecting twins and this places her at higher risk for pregnancy complications. This will allow patient to be more closely monitored. Discussed with patient, she understands and is agreeable to this. She denies any further questions or concerns at this time.  -No varicella immunization noted per chart review, patient states that she may have had the virus as a child. Consider varicella titer at future prenatal care visit.    Follow up with OB at next prenatal visit.

## 2020-11-23 ENCOUNTER — Encounter: Payer: Self-pay | Admitting: Family Medicine

## 2020-11-23 ENCOUNTER — Ambulatory Visit (INDEPENDENT_AMBULATORY_CARE_PROVIDER_SITE_OTHER): Payer: Medicaid Other | Admitting: Family Medicine

## 2020-11-23 ENCOUNTER — Other Ambulatory Visit: Payer: Self-pay

## 2020-11-23 VITALS — BP 94/60 | HR 76 | Wt 171.4 lb

## 2020-11-23 DIAGNOSIS — Z3491 Encounter for supervision of normal pregnancy, unspecified, first trimester: Secondary | ICD-10-CM

## 2020-11-23 DIAGNOSIS — Z3A08 8 weeks gestation of pregnancy: Secondary | ICD-10-CM | POA: Diagnosis not present

## 2020-11-23 DIAGNOSIS — O0991 Supervision of high risk pregnancy, unspecified, first trimester: Secondary | ICD-10-CM

## 2020-11-23 DIAGNOSIS — O0990 Supervision of high risk pregnancy, unspecified, unspecified trimester: Secondary | ICD-10-CM | POA: Insufficient documentation

## 2020-11-23 DIAGNOSIS — O30091 Twin pregnancy, unable to determine number of placenta and number of amniotic sacs, first trimester: Secondary | ICD-10-CM | POA: Diagnosis present

## 2020-11-23 DIAGNOSIS — O30009 Twin pregnancy, unspecified number of placenta and unspecified number of amniotic sacs, unspecified trimester: Secondary | ICD-10-CM | POA: Insufficient documentation

## 2020-11-23 LAB — POCT URINALYSIS DIP (MANUAL ENTRY)
Bilirubin, UA: NEGATIVE
Blood, UA: NEGATIVE
Glucose, UA: NEGATIVE mg/dL
Ketones, POC UA: NEGATIVE mg/dL
Leukocytes, UA: NEGATIVE
Nitrite, UA: NEGATIVE
Protein Ur, POC: NEGATIVE mg/dL
Spec Grav, UA: 1.025 (ref 1.010–1.025)
Urobilinogen, UA: 1 E.U./dL
pH, UA: 7 (ref 5.0–8.0)

## 2020-11-23 NOTE — Patient Instructions (Addendum)
It was great seeing you today!  I am glad that things are going well! I will let you know of any abnormal results from today's lab work. Due to you having twins, it is recommended to receive your prenatal care with an OB. I have placed the referral for you.   Please call [phone] to schedule your visit with the Obstetricians.   Go to the MAU at [facility] if:  You have pain in your lower abdomen or pelvic area  Your water breaks.  Sometimes it is a big gush of fluid, sometimes it is just a trickle that keeps getting your underwear wet or running down your legs  You have vaginal bleeding.  Please remember to take your prenatal vitamin daily.   Please don't hesitate to contact our office.   Thank you for allowing [place] to be a part of your medical care!  Thank you, Dr. Robyne Peers   https://[email or web address].org/womens-health/faqs/prenatal-genetic-screening-tests">  Prenatal Care Prenatal care is health care during pregnancy. It helps you and your unborn baby (fetus) stay as healthy as possible. Prenatal care may be provided by a midwife, a family practice doctor, a Dispensing optician (nurse practitioner or physician assistant), or a childbirth and pregnancy doctor (obstetrician). How does this affect me? During pregnancy, you will be closely monitored for any new conditions that might develop. To lower your risk of pregnancy complications, you and your health care provider will talk about any underlying conditions you have. How does this affect my baby? Early and consistent prenatal care increases the chance that your baby will be healthy during pregnancy. Prenatal care lowers the risk that your baby will be:  Born early (prematurely).  Smaller than expected at birth (small for gestational age). What can I expect at the first prenatal care visit? Your first prenatal care visit will likely be the longest. You should schedule your first prenatal care visit as  soon as you know that you are pregnant. Your first visit is a good time to talk about any questions or concerns you have about pregnancy. Medical history At your visit, you and your health care provider will talk about your medical history, including:  Any past pregnancies.  Your family's medical history.  Medical history of the baby's father.  Any long-term (chronic) health conditions you have and how you manage them.  Any surgeries or procedures you have had.  Any current over-the-counter or prescription medicines, herbs, or supplements that you are taking.  Other factors that could pose a risk to your baby, including: ? Exposure to harmful chemicals or radiation at work or at home. ? Any substance use, including tobacco, alcohol, and drug use.  Your home setting and your stress levels, including: ? Exposure to abuse or violence. ? Household financial strain.  Your daily health habits, including diet and exercise. Tests and screenings Your health care provider will:  Measure your weight, height, and blood pressure.  Do a physical exam, including a pelvic and breast exam.  Perform blood tests and urine tests to check for: ? Urinary tract infection. ? Sexually transmitted infections (STIs). ? Low iron levels in your blood (anemia). ? Blood type and certain proteins on red blood cells (Rh antibodies). ? Infections and immunity to viruses, such as hepatitis B and rubella. ? HIV (human immunodeficiency virus).  Discuss your options for genetic screening. Tips about staying healthy Your health care provider will also give you information about how to keep yourself and your baby healthy,  including:  Nutrition and taking vitamins.  Physical activity.  How to manage pregnancy symptoms such as nausea and vomiting (morning sickness).  Infections and substances that may be harmful to your baby and how to avoid them.  Food safety.  Dental  care.  Working.  Travel.  Warning signs to watch for and when to call your health care provider. How often will I have prenatal care visits? After your first prenatal care visit, you will have regular visits throughout your pregnancy. The visit schedule is often as follows:  Up to week 28 of pregnancy: once every 4 weeks.  28-36 weeks: once every 2 weeks.  After 36 weeks: every week until delivery. Some women may have visits more or less often depending on any underlying health conditions and the health of the baby. Keep all follow-up and prenatal care visits. This is important. What happens during routine prenatal care visits? Your health care provider will:  Measure your weight and blood pressure.  Check for fetal heart sounds.  Measure the height of your uterus in your abdomen (fundal height). This may be measured starting around week 20 of pregnancy.  Check the position of your baby inside your uterus.  Ask questions about your diet, sleeping patterns, and whether you can feel the baby move.  Review warning signs to watch for and signs of labor.  Ask about any pregnancy symptoms you are having and how you are dealing with them. Symptoms may include: ? Headaches. ? Nausea and vomiting. ? Vaginal discharge. ? Swelling. ? Fatigue. ? Constipation. ? Changes in your vision. ? Feeling persistently sad or anxious. ? Any discomfort, including back or pelvic pain. ? Bleeding or spotting. Make a list of questions to ask your health care provider at your routine visits.   What tests might I have during prenatal care visits? You may have blood, urine, and imaging tests throughout your pregnancy, such as:  Urine tests to check for glucose, protein, or signs of infection.  Glucose tests to check for a form of diabetes that can develop during pregnancy (gestational diabetes mellitus). This is usually done around week 24 of pregnancy.  Ultrasounds to check your baby's growth  and development, to check for birth defects, and to check your baby's well-being. These can also help to decide when you should deliver your baby.  A test to check for group B strep (GBS) infection. This is usually done around week 36 of pregnancy.  Genetic testing. This may include blood, fluid, or tissue sampling, or imaging tests, such as an ultrasound. Some genetic tests are done during the first trimester and some are done during the second trimester. What else can I expect during prenatal care visits? Your health care provider may recommend getting certain vaccines during pregnancy. These may include:  A yearly flu shot (annual influenza vaccine). This is especially important if you will be pregnant during flu season.  Tdap (tetanus, diphtheria, pertussis) vaccine. Getting this vaccine during pregnancy can protect your baby from whooping cough (pertussis) after birth. This vaccine may be recommended between weeks 27 and 36 of pregnancy.  A COVID-19 vaccine. Later in your pregnancy, your health care provider may give you information about:  Childbirth and breastfeeding classes.  Choosing a health care provider for your baby.  Umbilical cord banking.  Breastfeeding.  Birth control after your baby is born.  The hospital labor and delivery unit and how to set up a tour.  Registering at the hospital before you go into  labor. Where to find more information  Office on Women's Health: TravelLesson.ca  American Pregnancy Association: americanpregnancy.org  March of Dimes: marchofdimes.org Summary  Prenatal care helps you and your baby stay as healthy as possible during pregnancy.  Your first prenatal care visit will most likely be the longest.  You will have visits and tests throughout your pregnancy to monitor your health and your baby's health.  Bring a list of questions to your visits to ask your health care provider.  Make sure to keep all follow-up and prenatal care  visits. This information is not intended to replace advice given to you by your health care provider. Make sure you discuss any questions you have with your health care provider. Document Revised: 06/16/2020 Document Reviewed: 06/16/2020 Elsevier Patient Education  2021 [employer].

## 2020-11-24 ENCOUNTER — Other Ambulatory Visit (HOSPITAL_COMMUNITY)
Admission: RE | Admit: 2020-11-24 | Discharge: 2020-11-24 | Disposition: A | Discharge status: Home / Self Care | Payer: Medicaid Other | Source: Ambulatory Visit | Attending: Family Medicine | Admitting: Family Medicine

## 2020-11-24 ENCOUNTER — Encounter: Payer: Self-pay | Admitting: Family Medicine

## 2020-11-24 ENCOUNTER — Encounter: Payer: Medicaid Other | Admitting: Family Medicine

## 2020-11-24 DIAGNOSIS — Z3491 Encounter for supervision of normal pregnancy, unspecified, first trimester: Secondary | ICD-10-CM | POA: Diagnosis not present

## 2020-11-24 NOTE — Addendum Note (Signed)
Addended by: Reece Leader on: 11/24/2020 08:56 AM   Modules accepted: Orders

## 2020-11-25 LAB — URINE CULTURE, OB REFLEX: Organism ID, Bacteria: NO GROWTH

## 2020-11-25 LAB — CERVICOVAGINAL ANCILLARY ONLY
Chlamydia: NEGATIVE
Comment: NEGATIVE
Comment: NORMAL
Neisseria Gonorrhea: NEGATIVE

## 2020-11-25 LAB — CULTURE, OB URINE

## 2020-11-25 LAB — CYTOLOGY - PAP
Comment: NEGATIVE
Diagnosis: NEGATIVE
Trichomonas: NEGATIVE

## 2020-12-14 ENCOUNTER — Ambulatory Visit (INDEPENDENT_AMBULATORY_CARE_PROVIDER_SITE_OTHER): Payer: Medicaid Other | Admitting: Obstetrics and Gynecology

## 2020-12-14 ENCOUNTER — Other Ambulatory Visit: Payer: Self-pay

## 2020-12-14 ENCOUNTER — Encounter: Payer: Self-pay | Admitting: Obstetrics and Gynecology

## 2020-12-14 ENCOUNTER — Encounter: Payer: Self-pay | Admitting: *Deleted

## 2020-12-14 VITALS — BP 119/78 | HR 95 | Wt 171.0 lb

## 2020-12-14 DIAGNOSIS — Z3A11 11 weeks gestation of pregnancy: Secondary | ICD-10-CM

## 2020-12-14 DIAGNOSIS — O30041 Twin pregnancy, dichorionic/diamniotic, first trimester: Secondary | ICD-10-CM

## 2020-12-14 DIAGNOSIS — O0991 Supervision of high risk pregnancy, unspecified, first trimester: Secondary | ICD-10-CM

## 2020-12-14 DIAGNOSIS — O0990 Supervision of high risk pregnancy, unspecified, unspecified trimester: Secondary | ICD-10-CM | POA: Diagnosis not present

## 2020-12-14 MED ORDER — METOCLOPRAMIDE HCL 10 MG PO TABS: 10.0000 mg | ORAL_TABLET | Freq: Four times a day (QID) | ORAL | 2 refills | Status: DC | PRN

## 2020-12-14 NOTE — Patient Instructions (Signed)
https://www.cdc.gov/pregnancy/infections.html">  First Trimester of Pregnancy  The first trimester of pregnancy starts on the first day of your last menstrual period until the end of week 12. This is also called months 1 through 3 of pregnancy. Body changes during your first trimester Your body goes through many changes during pregnancy. The changes usually return to normal after your baby is born. Physical changes  You may gain or lose weight.  Your breasts may grow larger and hurt. The area around your nipples may get darker.  Dark spots or blotches may develop on your face.  You may have changes in your hair. Health changes  You may feel like you might vomit (nauseous), and you may vomit.  You may have heartburn.  You may have headaches.  You may have trouble pooping (constipation).  Your gums may bleed. Other changes  You may get tired easily.  You may pee (urinate) more often.  Your menstrual periods will stop.  You may not feel hungry.  You may want to eat certain kinds of food.  You may have changes in your emotions from day to day.  You may have more dreams. Follow these instructions at home: Medicines  Take over-the-counter and prescription medicines only as told by your doctor. Some medicines are not safe during pregnancy.  Take a prenatal vitamin that contains at least 600 micrograms (mcg) of folic acid. Eating and drinking  Eat healthy meals that include: ? Fresh fruits and vegetables. ? Whole grains. ? Good sources of protein, such as meat, eggs, or tofu. ? Low-fat dairy products.  Avoid raw meat and unpasteurized juice, milk, and cheese.  If you feel like you may vomit, or you vomit: ? Eat 4 or 5 small meals a day instead of 3 large meals. ? Try eating a few soda crackers. ? Drink liquids between meals instead of during meals.  You may need to take these actions to prevent or treat trouble pooping: ? Drink enough fluids to keep your pee  (urine) pale yellow. ? Eat foods that are high in fiber. These include beans, whole grains, and fresh fruits and vegetables. ? Limit foods that are high in fat and sugar. These include fried or sweet foods. Activity  Exercise only as told by your doctor. Most people can do their usual exercise routine during pregnancy.  Stop exercising if you have cramps or pain in your lower belly (abdomen) or low back.  Do not exercise if it is too hot or too humid, or if you are in a place of great height (high altitude).  Avoid heavy lifting.  If you choose to, you may have sex unless your doctor tells you not to. Relieving pain and discomfort  Wear a good support bra if your breasts are sore.  Rest with your legs raised (elevated) if you have leg cramps or low back pain.  If you have bulging veins (varicose veins) in your legs: ? Wear support hose as told by your doctor. ? Raise your feet for 15 minutes, 3-4 times a day. ? Limit salt in your food. Safety  Wear your seat belt at all times when you are in a car.  Talk with your doctor if someone is hurting you or yelling at you.  Talk with your doctor if you are feeling sad or have thoughts of hurting yourself. Lifestyle  Do not use hot tubs, steam rooms, or saunas.  Do not douche. Do not use tampons or scented sanitary pads.  Do not   use herbal medicines, illegal drugs, or medicines that are not approved by your doctor. Do not drink alcohol.  Do not smoke or use any products that contain nicotine or tobacco. If you need help quitting, ask your doctor.  Avoid cat litter boxes and soil that is used by cats. These carry germs that can cause harm to the baby and can cause a loss of your baby by miscarriage or stillbirth. General instructions  Keep all follow-up visits. This is important.  Ask for help if you need counseling or if you need help with nutrition. Your doctor can give you advice or tell you where to go for help.  Visit your  dentist. At home, brush your teeth with a soft toothbrush. Floss gently.  Write down your questions. Take them to your prenatal visits. Where to find more information  American Pregnancy Association: americanpregnancy.org  American College of Obstetricians and Gynecologists: www.acog.org  Office on Women's Health: womenshealth.gov/pregnancy Contact a doctor if:  You are dizzy.  You have a fever.  You have mild cramps or pressure in your lower belly.  You have a nagging pain in your belly area.  You continue to feel like you may vomit, you vomit, or you have watery poop (diarrhea) for 24 hours or longer.  You have a bad-smelling fluid coming from your vagina.  You have pain when you pee.  You are exposed to a disease that spreads from person to person, such as chickenpox, measles, Zika virus, HIV, or hepatitis. Get help right away if:  You have spotting or bleeding from your vagina.  You have very bad belly cramping or pain.  You have shortness of breath or chest pain.  You have any kind of injury, such as from a fall or a car crash.  You have new or increased pain, swelling, or redness in an arm or leg. Summary  The first trimester of pregnancy starts on the first day of your last menstrual period until the end of week 12 (months 1 through 3).  Eat 4 or 5 small meals a day instead of 3 large meals.  Do not smoke or use any products that contain nicotine or tobacco. If you need help quitting, ask your doctor.  Keep all follow-up visits. This information is not intended to replace advice given to you by your health care provider. Make sure you discuss any questions you have with your health care provider. Document Revised: 02/10/2020 Document Reviewed: 12/17/2019 Elsevier Patient Education  2021 Elsevier Inc.  

## 2020-12-14 NOTE — Progress Notes (Signed)
   PRENATAL VISIT NOTE  Subjective:  Sara Manning is a 35 y.o. G4P3003 at [redacted]w[redacted]d being seen today for ongoing prenatal care.  She is currently monitored for the following issues for this high-risk pregnancy and has Gastroesophageal reflux disease; Bipolar disorder, current episode depressed, moderate (HCC); Eczema; Migraine with aura and with status migrainosus, not intractable; Headache; Positive pregnancy test; Supervision of high-risk pregnancy; and Twin pregnancy on their problem list.  Patient reports nausea and vomiting.  Contractions: Not present. Vag. Bleeding: Scant.  Movement: Absent. Denies leaking of fluid.   The following portions of the patient's history were reviewed and updated as appropriate: allergies, current medications, past family history, past medical history, past social history, past surgical history and problem list.   Objective:   Vitals:   12/14/20 1453  BP: 119/78  Pulse: 95  Weight: 171 lb (77.6 kg)    Fetal Status: Fetal Heart Rate (bpm): 145/156   Movement: Absent     General:  Alert, oriented and cooperative. Patient is in no acute distress.  Skin: Skin is warm and dry. No rash noted.   Cardiovascular: Normal heart rate noted  Respiratory: Normal respiratory effort, no problems with respiration noted  Abdomen: Soft, gravid, appropriate for gestational age.  Pain/Pressure: Present     Pelvic: Cervical exam deferred        Extremities: Normal range of motion.  Edema: None  Mental Status: Normal mood and affect. Normal behavior. Normal judgment and thought content.   Assessment and Plan:  Pregnancy: G4P3003 at [redacted]w[redacted]d 1. Supervision of high risk pregnancy in first trimester -welcomed to practice and discussed structure of practice including physicians and [employer], presence of learners.  -reglan prescribed for nausea    2. Dichorionic diamniotic twin pregnancy in first trimester -anatomy US ordered -Genetic testing desired and ordered   3. [redacted] weeks  gestation of pregnancy   Preterm labor symptoms and general obstetric precautions including but not limited to vaginal bleeding, contractions, leaking of fluid and fetal movement were reviewed in detail with the patient. Please refer to After Visit Summary for other counseling recommendations.   Return in about 4 weeks (around 01/11/2021) for OB.  No future appointments.  Gita Kudo, MD

## 2020-12-15 LAB — HEMOGLOBIN A1C
Est. average glucose Bld gHb Est-mCnc: 94 mg/dL
Hgb A1c MFr Bld: 4.9 % (ref 4.8–5.6)

## 2020-12-15 NOTE — BH Specialist Note (Signed)
Integrated Behavioral Health via Telemedicine Visit  12/15/2020 SARH KIRSCHENBAUM [id number]  Number of Integrated Behavioral Health visits: 1 Session Start time: 9:17  Session End time: 9:47 Total time: 30  Referring Provider: Casper Harrison, MD Patient/Family location: Home [facility] Provider location: Center for [facility] at [facility]  All persons participating in visit: [name] and Sara Manning Sara Manning   Types of Service: Individual psychotherapy and Video visit  I connected with Sara Manning and/or Sara Manning's n/a via  Telephone or Video Enabled Telemedicine Application  (Video is Caregility application) and verified that I am speaking with the correct person using two identifiers. Discussed confidentiality: Yes   I discussed the limitations of telemedicine and the availability of in person appointments.  Discussed there is a possibility of technology failure and discussed alternative modes of communication if that failure occurs.  I discussed that engaging in this telemedicine visit, they consent to the provision of behavioral healthcare and the services will be billed under their insurance.  Patient and/or legal guardian expressed understanding and consented to Telemedicine visit: Yes   Presenting Concerns: Patient and/or family reports the following symptoms/concerns: Pt states her primary concern today is migraine "all day, every day"(not able to take migraine meds in pregnancy), nausea ("medicine not working"), sleeping poorly (about 4 hours/night, attributed to migraine and long work hours), fatigue, and right leg is painful, making it hard to walk. Pt is coping by taking warm baths and melatonin as needed.  Duration of problem: Current pregnancy; Severity of problem: severe  Patient and/or Family's Strengths/Protective Factors: Sense of purpose  Goals Addressed: Patient will: 1.  Reduce symptoms of: anxiety, depression and  stress  2.  Increase knowledge and/or ability of: stress reduction  3.  Demonstrate ability to: Increase healthy adjustment to current life circumstances  Progress towards Goals: Ongoing  Interventions: Interventions utilized:  Solution-Focused Strategies and Psychoeducation and/or Health Education Standardized Assessments completed: Not Needed  Patient and/or Family Response: Pt agrees with treatment plan  Assessment: Patient currently experiencing Bipolar 2 disorder (as previously diagnosed by psychiatry).   Patient may benefit from psychoeducation and brief therapeutic interventions regarding coping with symptoms of anxiety, depression, stress .  Plan: 1. Follow up with behavioral health clinician on : Two weeks; Call [name] as needed prior to scheduled visit 2. Behavioral recommendations:  -Continue taking prenatal vitamin daily -Expect a call-back from clinical/medical staff today regarding medical concern -Continue using self-coping strategies (warm bath; melatonin as needed) to help manage symptoms at this time -Discuss with medical provider on 01/11/21 any further recommendations regarding managing migraine and nausea during remainder of pregnancy -Consider apps (on After Visit Summary) for additional self-care/self-coping as needed 3. Referral(s): Integrated [employer] (In Clinic)  I discussed the assessment and treatment plan with the patient and/or parent/guardian. They were provided an opportunity to ask questions and all were answered. They agreed with the plan and demonstrated an understanding of the instructions.   They were advised to call back or seek an in-person evaluation if the symptoms worsen or if the condition fails to improve as anticipated.  [name], LCSW   Depression screen [facility] 2/9 12/14/2020 12/14/2020 11/23/2020 11/14/2020 11/06/2019  Decreased Interest 3 3 3 3 2   Down, Depressed, Hopeless 3 3 2 1 1   PHQ - 2 Score 6 6 5 4 3   Altered  sleeping 3 3 3 3 3   Tired, decreased energy 3 3 3 3  -  Change  in appetite 2 2 2 3 1   Feeling bad or failure about yourself  1 1 2 1  0  Trouble concentrating 3 3 3 2 3   Moving slowly or fidgety/restless - - 0 1 0  Suicidal thoughts 0 0 0 1 0  PHQ-9 Score 18 18 18 18 10    GAD 7 : Generalized Anxiety Score 12/14/2020 12/14/2020  Nervous, Anxious, on Edge 3 3  Control/stop worrying 2 2  Worry too much - different things 3 3  Trouble relaxing 3 3  Restless 3 3  Easily annoyed or irritable 3 3  Afraid - awful might happen 3 3  Total GAD 7 Score 20 20

## 2020-12-16 ENCOUNTER — Encounter: Payer: Self-pay | Admitting: *Deleted

## 2020-12-26 ENCOUNTER — Encounter: Payer: Self-pay | Admitting: *Deleted

## 2020-12-27 ENCOUNTER — Encounter: Payer: Self-pay | Admitting: General Practice

## 2020-12-28 ENCOUNTER — Encounter: Payer: Self-pay | Admitting: *Deleted

## 2020-12-28 ENCOUNTER — Ambulatory Visit (INDEPENDENT_AMBULATORY_CARE_PROVIDER_SITE_OTHER): Payer: Medicaid Other | Admitting: Clinical

## 2020-12-28 ENCOUNTER — Telehealth: Payer: Self-pay

## 2020-12-28 DIAGNOSIS — F3160 Bipolar disorder, current episode mixed, unspecified: Secondary | ICD-10-CM | POA: Diagnosis not present

## 2020-12-28 MED ORDER — CYCLOBENZAPRINE HCL 10 MG PO TABS: 10.0000 mg | ORAL_TABLET | Freq: Three times a day (TID) | ORAL | 1 refills | Status: DC | PRN

## 2020-12-28 NOTE — Patient Instructions (Signed)
[facility] at [facility] [address] [place], [place] [address] [phone] (main office) [phone] (Jaques Mineer's office)  /Emotional Wellbeing Apps and Websites Here are a few free apps meant to help you to help yourself.  To find, try searching on the internet to see if the app is offered on Apple/Android devices. If your first choice doesn't come up on your device, the good news is that there are many choices! Play around with different apps to see which ones are helpful to you.    Calm This is an app meant to help increase calm feelings. Includes info, strategies, and tools for tracking your feelings.      Calm Harm  This app is meant to help with self-harm. Provides many 5-minute or 15-min coping strategies for doing instead of hurting yourself.       Healthy Minds Health Minds is a problem-solving tool to help deal with emotions and cope with stress you encounter wherever you are.      MindShift This app can help people cope with anxiety. Rather than trying to avoid anxiety, you can make an important shift and face it.      MY3  MY3 features a support system, safety plan and resources with the goal of offering a tool to use in a time of need.       My Life My Voice  This mood journal offers a simple solution for tracking your thoughts, feelings and moods. Animated emoticons can help identify your mood.       Relax Melodies Designed to help with sleep, on this app you can mix sounds and meditations for relaxation.      Smiling Mind Smiling Mind is meditation made easy: it's a simple tool that helps put a smile on your mind.        Stop, Breathe & Think  A friendly, simple guide for people through meditations for mindfulness and compassion.  Stop, Breathe and Think Kids Enter your current feelings and choose a "mission" to help you cope. Offers videos for certain moods instead of just sound recordings.       Team  Orange The goal of this tool is to help teens change how they think, act, and react. This app helps you focus on your own good feelings and experiences.      The Virtual Hope Box The Virtual Hope Box (VHB) contains simple tools to help patients with coping, relaxation, distraction, and positive thinking.     

## 2020-12-28 NOTE — Telephone Encounter (Signed)
Received a message from [facility] that patient is having leg pain that comes and goes and she is feeling nauseous.  Called pt and confirmed with patient if she is having any swelling or redness in the leg.  Pt states "no".  Pt reports that she the pain come and goes.  I explained to the patient that her pain will be different especially with a twin a pregnancy.  I advised pt that if the leg pain does not go away with Tylenol or stretching, she begins to swell, and/or get redness to please go to MAU.  Pt reports that she does not feel the need to go to MAU at this time.  I informed pt to try bland foods when she is nauseated along with the nausea medications that was prescribed.    Addison Naegeli, RN

## 2020-12-28 NOTE — BH Specialist Note (Signed)
Integrated Behavioral Health via Telemedicine Visit  12/28/2020 Sara Manning [id number]  Number of Integrated Behavioral Health visits: 2 Session Start time: 10:45  Session End time: 11:22 Total time: 37  Referring Provider: Catalina Antigua, MD Patient/Family location: Home [facility] Provider location: [facility] for [facility] at [facility]  All persons participating in visit: [name] and [facility] Sharian Delia   Types of Service: Individual psychotherapy and Video visit  I connected with KIELYN KARDELL and/or Teasha M Depriest's n/a via  Telephone or Video Enabled Telemedicine Application  (Video is Caregility application) and verified that I am speaking with the correct person using two identifiers. Discussed confidentiality: Yes   I discussed the limitations of telemedicine and the availability of in person appointments.  Discussed there is a possibility of technology failure and discussed alternative modes of communication if that failure occurs.  I discussed that engaging in this telemedicine visit, they consent to the provision of behavioral healthcare and the services will be billed under their insurance.  Patient and/or legal guardian expressed understanding and consented to Telemedicine visit: Yes   Presenting Concerns: Patient and/or family reports the following symptoms/concerns: Pt states her migraines and nausea are decreasing, sleep has remained the same (about 4 hours/night), and leg pain is increasing. Pt is still taking warm baths nightly; copes with emotions by using her creativity to create marketable t-shirts and other items.  Duration of problem: Ongoing; Severity of problem: severe  Patient and/or Family's Strengths/Protective Factors: Sense of purpose  Goals Addressed: Patient will: 1.  Reduce symptoms of: anxiety, depression and stress  2.  Increase knowledge and/or ability of: healthy habits  3.  Demonstrate ability to:  Increase healthy adjustment to current life circumstances  Progress towards Goals: Ongoing  Interventions: Interventions utilized:  Solution-Focused Strategies Standardized Assessments completed: Not Needed  Patient and/or Family Response: Pt agrees to treatment plan  Assessment: Patient currently experiencing Bipolar 2 disorder (as previously diagnosed).   Patient may benefit from psychoeducation and brief therapeutic interventions regarding coping with symptoms of anxiety, depression, stress.  Plan: 1. Follow up with behavioral health clinician on : One month 2. Behavioral recommendations:  -Continue taking prenatal vitamin daily -Continue using self-coping strategies to manage physical and emotional pain (warm baths, crafting projects) -Discuss medical concerns with medical provider at this afternoon's visit (leg pain; taking melatonin for sleep recommendations) -Begin using sleep sounds at bedtime for the next two weeks. If it helps with sleep, continue.  3. Referral(s): Integrated [employer] (In Clinic)  I discussed the assessment and treatment plan with the patient and/or parent/guardian. They were provided an opportunity to ask questions and all were answered. They agreed with the plan and demonstrated an understanding of the instructions.   They were advised to call back or seek an in-person evaluation if the symptoms worsen or if the condition fails to improve as anticipated.  Valetta Close Giovoni Bunch, LCSW

## 2021-01-11 ENCOUNTER — Ambulatory Visit (INDEPENDENT_AMBULATORY_CARE_PROVIDER_SITE_OTHER): Payer: Medicaid Other | Admitting: Clinical

## 2021-01-11 ENCOUNTER — Other Ambulatory Visit: Payer: Self-pay

## 2021-01-11 ENCOUNTER — Encounter: Payer: Self-pay | Admitting: Obstetrics and Gynecology

## 2021-01-11 ENCOUNTER — Encounter: Payer: Self-pay | Admitting: Family Medicine

## 2021-01-11 ENCOUNTER — Ambulatory Visit (INDEPENDENT_AMBULATORY_CARE_PROVIDER_SITE_OTHER): Payer: Medicaid Other | Admitting: Obstetrics and Gynecology

## 2021-01-11 VITALS — BP 109/68 | HR 79 | Wt 168.0 lb

## 2021-01-11 DIAGNOSIS — O0992 Supervision of high risk pregnancy, unspecified, second trimester: Secondary | ICD-10-CM | POA: Diagnosis not present

## 2021-01-11 DIAGNOSIS — F3160 Bipolar disorder, current episode mixed, unspecified: Secondary | ICD-10-CM | POA: Diagnosis not present

## 2021-01-11 MED ORDER — BLOOD PRESSURE KIT: PACK | 0 refills | Status: AC

## 2021-01-11 MED ORDER — BLOOD PRESSURE KIT DEVI: 1.0000 | Freq: Once | 0 refills | Status: AC

## 2021-01-11 MED ORDER — GOJJI WEIGHT SCALE MISC: 0 refills | Status: DC

## 2021-01-11 MED ORDER — ZOLPIDEM TARTRATE 5 MG PO TABS: 5.0000 mg | ORAL_TABLET | Freq: Every evening | ORAL | 1 refills | Status: DC | PRN

## 2021-01-11 MED ORDER — GOJJI WEIGHT SCALE MISC: 1.0000 | Freq: Once | 0 refills | Status: AC

## 2021-01-11 NOTE — Progress Notes (Signed)
   PRENATAL VISIT NOTE  Subjective:  Sara Manning is a 35 y.o. G4P3003 at [redacted]w[redacted]d being seen today for ongoing prenatal care.  She is currently monitored for the following issues for this high-risk pregnancy and has Gastroesophageal reflux disease; Bipolar disorder, current episode depressed, moderate (HCC); Eczema; Migraine with aura and with status migrainosus, not intractable; Headache; Positive pregnancy test; Supervision of high-risk pregnancy; and Twin pregnancy on their problem list.  Patient reports insomina.  Contractions: Not present. Vag. Bleeding: None.  Movement: Absent. Denies leaking of fluid.   The following portions of the patient's history were reviewed and updated as appropriate: allergies, current medications, past family history, past medical history, past social history, past surgical history and problem list.   Objective:   Vitals:   01/11/21 1445  BP: 109/68  Pulse: 79  Weight: 168 lb (76.2 kg)    Fetal Status:     Movement: Absent     General:  Alert, oriented and cooperative. Patient is in no acute distress.  Skin: Skin is warm and dry. No rash noted.   Cardiovascular: Normal heart rate noted  Respiratory: Normal respiratory effort, no problems with respiration noted  Abdomen: Soft, gravid, appropriate for gestational age.  Pain/Pressure: Present     Pelvic: Cervical exam deferred        Extremities: Normal range of motion.  Edema: None  Mental Status: Normal mood and affect. Normal behavior. Normal judgment and thought content.   Assessment and Plan:  Pregnancy: G4P3003 at [redacted]w[redacted]d 1. Supervision of high risk pregnancy in second trimester Patient is doing well Rx Ambien provided to help with insomnia Work note also provided restricting weight lifting and shift to no more than 8 hours instead of her 12 hour shifts  2. Di-Di twin AFP today Anatomy ultrasound scheduled  Preterm labor symptoms and general obstetric precautions including but not limited to  vaginal bleeding, contractions, leaking of fluid and fetal movement were reviewed in detail with the patient. Please refer to After Visit Summary for other counseling recommendations.   Return in about 4 weeks (around 02/08/2021) for in person, ROB, High risk.  Future Appointments  Date Time Provider Department Center  02/07/2021 10:00 AM [facility] NURSE [facility]  02/07/2021 10:15 AM [facility]  02/08/2021 10:45 AM [facility]    Catalina Antigua, MD

## 2021-01-11 NOTE — Addendum Note (Signed)
Addended by: Catalina Antigua on: 01/11/2021 04:22 PM   Modules accepted: Orders

## 2021-01-11 NOTE — Patient Instructions (Signed)
[facility] at  [facility] [address] [place], [place] [address] [phone] (main office) [phone] (Jakala Herford's office)  /Emotional Wellbeing Apps and Websites Here are a few free apps meant to help you to help yourself.  To find, try searching on the internet to see if the app is offered on Apple/Android devices. If your first choice doesn't come up on your device, the good news is that there are many choices! Play around with different apps to see which ones are helpful to you.    Calm This is an app meant to help increase calm feelings. Includes info, strategies, and tools for tracking your feelings.      Calm Harm  This app is meant to help with self-harm. Provides many 5-minute or 15-min coping strategies for doing instead of hurting yourself.       Healthy Minds Health Minds is a problem-solving tool to help deal with emotions and cope with stress you encounter wherever you are.      MindShift This app can help people cope with anxiety. Rather than trying to avoid anxiety, you can make an important shift and face it.      MY3  MY3 features a support system, safety plan and resources with the goal of offering a tool to use in a time of need.       My Life My Voice  This mood journal offers a simple solution for tracking your thoughts, feelings and moods. Animated emoticons can help identify your mood.       Relax Melodies Designed to help with sleep, on this app you can mix sounds and meditations for relaxation.      Smiling Mind Smiling Mind is meditation made easy: it's a simple tool that helps put a smile on your mind.        Stop, Breathe & Think  A friendly, simple guide for people through meditations for mindfulness and compassion.  Stop, Breathe and Think Kids Enter your current feelings and choose a "mission" to help you cope. Offers videos for certain moods instead of just sound recordings.       Team  Orange The goal of this tool is to help teens change how they think, act, and react. This app helps you focus on your own good feelings and experiences.      The Virtual Hope Box The Virtual Hope Box (VHB) contains simple tools to help patients with coping, relaxation, distraction, and positive thinking.     

## 2021-01-13 LAB — AFP, SERUM, OPEN SPINA BIFIDA
AFP MoM: 2.7
AFP Value: 85.3 ng/mL
Gest. Age on Collection Date: 15.1 weeks
Maternal Age At EDD: 34.4 yr
OSBR Risk 1 IN: 1081
Test Results:: NEGATIVE
Weight: 168 [lb_av]

## 2021-01-15 ENCOUNTER — Other Ambulatory Visit: Payer: Self-pay | Admitting: Family Medicine

## 2021-01-15 DIAGNOSIS — Z3201 Encounter for pregnancy test, result positive: Secondary | ICD-10-CM

## 2021-01-25 NOTE — BH Specialist Note (Signed)
Integrated Behavioral Health via Telemedicine Visit  01/25/2021 [name] [id number]  Number of Integrated Behavioral Health visits: 3 Session Start time: 10:52  Session End time: 11:20 Total time: 28  Referring Provider: Catalina Antigua, MD Patient/Family location: Home [facility] Provider location: Center for [facility] Healthcare at [facility]  All persons participating in visit: [name] and Sara Manning   Types of Service: Individual psychotherapy and Video visit  I connected with Sara Manning and/or Sara Manning's n/a via  Telephone or Video Enabled Telemedicine Application  (Video is Caregility application) and verified that I am speaking with the correct person using two identifiers. Discussed confidentiality: Yes   I discussed the limitations of telemedicine and the availability of in person appointments.  Discussed there is a possibility of technology failure and discussed alternative modes of communication if that failure occurs.  I discussed that engaging in this telemedicine visit, they consent to the provision of behavioral healthcare and the services will be billed under their insurance.  Patient and/or legal guardian expressed understanding and consented to Telemedicine visit: Yes   Presenting Concerns: Patient and/or family reports the following symptoms/concerns: Pt states her primary concerns today are stress of  heartburn, leg pain, migraine with aura in current pregnancy; unemployment due to current health; sleep has improved with Ambien taken as needed. Pt's goal is healthy pregnancy.  Duration of problem: Current pregnancy; Severity of problem: moderately severe  Patient and/or Family's Strengths/Protective Factors: Sense of purpose  Goals Addressed: Patient will: 1.  Reduce symptoms of: anxiety, depression and stress  2.  Increase knowledge and/or ability of: healthy habits  3.  Demonstrate ability to: Increase  healthy adjustment to current life circumstances  Progress towards Goals: Ongoing  Interventions: Interventions utilized:  Solution-Focused Strategies Standardized Assessments completed: Not Needed  Patient and/or Family Response: Pt agrees with continued treatment plan  Assessment: Patient currently experiencing Psychosocial stress and Bipolar 2 disorder (as previously diagnosed).   Patient may benefit from continued psychoeducation and brief therapeutic interventions regarding coping with symptoms of depression, anxiety, life stress  Plan: 1. Follow up with behavioral health clinician on : Two months; Call [name] as needed prior to scheduled visit at [phone] 2. Behavioral recommendations:  -Continue taking prenatal vitamin daily -Pick up baby aspirin (over-the-counter) and Pepcid today at the pharmacy and begin taking as prescribed -Continue taking Ambien as prescribed -Continue to focus on self-coping activities (painting shirts, etc.) that help to cope with stress and escalating emotions for remainder of pregnancy -Read through Postpartum Planner and share with family supports (consider including children in helping to prepare for babies, including helping mom stay hydrated by bringing her water, to help prevent migraine) -Accept referral to [facility] -Accept referral to [facility] 3. Referral(s): Integrated [employer] (In Clinic) and [employer]:  Food  I discussed the assessment and treatment plan with the patient and/or parent/guardian. They were provided an opportunity to ask questions and all were answered. They agreed with the plan and demonstrated an understanding of the instructions.   They were advised to call back or seek an in-person evaluation if the symptoms worsen or if the condition fails to improve as anticipated.  [name], LCSW   Depression screen [facility] 2/9 02/07/2021 02/02/2021 01/11/2021 12/14/2020 12/14/2020  Decreased Interest  3 3 3 3 3   Down, Depressed, Hopeless 0 0 2 3 3   PHQ - 2 Score 3 3 5 6 6   Altered sleeping 1  1 3 3 3   Tired, decreased energy 3 3 3 3 3   Change in appetite 3 3 3 2 2   Feeling bad or failure about yourself  0 0 0 1 1  Trouble concentrating 3 3 3 3 3   Moving slowly or fidgety/restless 0 0 0 - -  Suicidal thoughts 0 0 0 0 0  PHQ-9 Score 13 13 17 18 18   Some recent data might be hidden   GAD 7 : Generalized Anxiety Score 02/07/2021 02/02/2021 01/11/2021 12/14/2020  Nervous, Anxious, on Edge 3 2 3 3   Control/stop worrying 3 3 3 2   Worry too much - different things 3 3 3 3   Trouble relaxing 3 3 3 3   Restless 2 2 3 3   Easily annoyed or irritable 3 3 3 3   Afraid - awful might happen 0 0 3 3  Total GAD 7 Score 17 16 21  20

## 2021-01-26 ENCOUNTER — Telehealth: Payer: Self-pay

## 2021-01-26 NOTE — Telephone Encounter (Signed)
Patient calls nurse line stating she needs allergy medication sent to her pharmacy. Patient reports she usually takes Claritin, however she is unsure if it is safe in pregnancy. Will forward to PCP.

## 2021-01-27 NOTE — Telephone Encounter (Signed)
Cetirizine is safe to take in pregnancy. Mirian Mo, MD

## 2021-02-01 ENCOUNTER — Encounter: Payer: Self-pay | Admitting: *Deleted

## 2021-02-02 ENCOUNTER — Other Ambulatory Visit (HOSPITAL_COMMUNITY)
Admission: RE | Admit: 2021-02-02 | Discharge: 2021-02-02 | Disposition: A | Discharge status: Home / Self Care | Payer: Medicaid Other | Source: Ambulatory Visit | Attending: Family Medicine | Admitting: Family Medicine

## 2021-02-02 ENCOUNTER — Encounter: Payer: Self-pay | Admitting: Family Medicine

## 2021-02-02 ENCOUNTER — Telehealth: Payer: Self-pay | Admitting: Family Medicine

## 2021-02-02 ENCOUNTER — Ambulatory Visit: Payer: Medicaid Other | Admitting: *Deleted

## 2021-02-02 ENCOUNTER — Other Ambulatory Visit: Payer: Self-pay

## 2021-02-02 VITALS — BP 102/69 | HR 82 | Ht 67.0 in | Wt 171.8 lb

## 2021-02-02 DIAGNOSIS — N898 Other specified noninflammatory disorders of vagina: Secondary | ICD-10-CM | POA: Diagnosis present

## 2021-02-02 DIAGNOSIS — O26899 Other specified pregnancy related conditions, unspecified trimester: Secondary | ICD-10-CM | POA: Diagnosis not present

## 2021-02-02 NOTE — Progress Notes (Signed)
Here for nurse visit for pelvic pain =10 and hard to walk. She is [redacted]w[redacted]d with twins. States hasn't gone to work.  States has vaginal itching, white vaginal discharge , feels like swollen in her perineal area. C/o pelvic pressure/pain for about 2 weeks.States when she stands=10 and feels like something going to fall out.  C/o contractions occasionally. Denies vaginal bleeding.  Elevated phq9 and gad 7 . Has appt with [facility] 02/08/21. She states she is ok and plans to see [facility] 02/08/21. Discussed patients complaints with  Luna Kitchens ,CNM  And she came in to do cervical exam which was long, fingertip dilated. Will do Pt referral .  Explained to patient we will treat according to wet prep results.   Explaind we will refer her to PT which will call her for appointment here at [facility]. Instructed her to keep her ob fu as scheduled. We discussed we have not taken her out of work , but she can discuss at her next visit and can discuss with her work if she elects to use FMLA at her choice right now. She voices understanding.  Ailen Strauch,RN

## 2021-02-02 NOTE — Telephone Encounter (Signed)
Returned patients call.   Patient reports that she is having pain with standing. It started a few days ago. She reports she has to stand for a few seconds or minutes before she can start walking.   Patient denies bleeding and is not sure if she is having discharge as she cannot see her bottom. She reports her vaginal area is swollen and itchy, she reports the discharge is thin. Advised patient to come in this afternoon at 2:20 for vaginal swab.   She reports she was having difficulty sleeping and was given medication and is sleeping much better.   Reviewed round ligament pain may be beginning and she is pregnant with twins. She is taking Tylenol as needed and is sitting on breaks at work.   She has a follow up OB appointment on Tuesday.

## 2021-02-02 NOTE — Telephone Encounter (Signed)
Pt states she is about 18 weeks and every time she stands she has pressure and pain and getting stronger within last 2 days and request someone call her back

## 2021-02-03 ENCOUNTER — Other Ambulatory Visit: Payer: Self-pay | Admitting: Student

## 2021-02-03 LAB — CERVICOVAGINAL ANCILLARY ONLY
Candida Glabrata: NEGATIVE
Candida Vaginitis: POSITIVE — AB
Chlamydia: NEGATIVE
Comment: NEGATIVE
Comment: NEGATIVE
Comment: NEGATIVE
Comment: NORMAL
Neisseria Gonorrhea: NEGATIVE

## 2021-02-07 ENCOUNTER — Other Ambulatory Visit: Payer: Self-pay

## 2021-02-07 ENCOUNTER — Other Ambulatory Visit: Payer: Self-pay | Admitting: Obstetrics

## 2021-02-07 ENCOUNTER — Ambulatory Visit (INDEPENDENT_AMBULATORY_CARE_PROVIDER_SITE_OTHER): Payer: Medicaid Other | Admitting: Family Medicine

## 2021-02-07 ENCOUNTER — Ambulatory Visit: Payer: Medicaid Other

## 2021-02-07 ENCOUNTER — Ambulatory Visit: Payer: Medicaid Other | Attending: Obstetrics and Gynecology

## 2021-02-07 ENCOUNTER — Encounter: Payer: Self-pay | Admitting: *Deleted

## 2021-02-07 ENCOUNTER — Ambulatory Visit: Payer: Medicaid Other | Admitting: *Deleted

## 2021-02-07 VITALS — BP 105/67 | HR 90 | Wt 172.0 lb

## 2021-02-07 VITALS — BP 111/67 | HR 78

## 2021-02-07 DIAGNOSIS — Z3A19 19 weeks gestation of pregnancy: Secondary | ICD-10-CM | POA: Diagnosis present

## 2021-02-07 DIAGNOSIS — O30042 Twin pregnancy, dichorionic/diamniotic, second trimester: Secondary | ICD-10-CM | POA: Diagnosis present

## 2021-02-07 DIAGNOSIS — O30041 Twin pregnancy, dichorionic/diamniotic, first trimester: Secondary | ICD-10-CM | POA: Insufficient documentation

## 2021-02-07 DIAGNOSIS — F3132 Bipolar disorder, current episode depressed, moderate: Secondary | ICD-10-CM | POA: Diagnosis present

## 2021-02-07 DIAGNOSIS — G43101 Migraine with aura, not intractable, with status migrainosus: Secondary | ICD-10-CM | POA: Diagnosis present

## 2021-02-07 DIAGNOSIS — Z362 Encounter for other antenatal screening follow-up: Secondary | ICD-10-CM

## 2021-02-07 DIAGNOSIS — K219 Gastro-esophageal reflux disease without esophagitis: Secondary | ICD-10-CM

## 2021-02-07 DIAGNOSIS — O0991 Supervision of high risk pregnancy, unspecified, first trimester: Secondary | ICD-10-CM | POA: Insufficient documentation

## 2021-02-07 DIAGNOSIS — O0992 Supervision of high risk pregnancy, unspecified, second trimester: Secondary | ICD-10-CM

## 2021-02-07 MED ORDER — FAMOTIDINE 20 MG PO TABS: 20.0000 mg | ORAL_TABLET | Freq: Two times a day (BID) | ORAL | 5 refills | Status: DC

## 2021-02-07 NOTE — Progress Notes (Signed)
   Subjective:  Sara Manning is a 35 y.o. [id number] at [redacted]w[redacted]d being seen today for ongoing prenatal care.  She is currently monitored for the following issues for this high-risk pregnancy and has Gastroesophageal reflux disease; Bipolar disorder, current episode depressed, moderate (HCC); Eczema; Migraine with aura and with status migrainosus, not intractable; Headache; Positive pregnancy test; Supervision of high-risk pregnancy; and Twin pregnancy on their problem list.  Patient reports round ligament pain.  Contractions: Not present. Vag. Bleeding: None.  Movement: Present. Denies leaking of fluid.   The following portions of the patient's history were reviewed and updated as appropriate: allergies, current medications, past family history, past medical history, past social history, past surgical history and problem list. Problem list updated.  Objective:   Vitals:   02/07/21 0855  BP: 105/67  Pulse: 90  Weight: 172 lb (78 kg)    Fetal Status: Fetal Heart Rate (bpm): 140/138   Movement: Present     General:  Alert, oriented and cooperative. Patient is in no acute distress.  Skin: Skin is warm and dry. No rash noted.   Cardiovascular: Normal heart rate noted  Respiratory: Normal respiratory effort, no problems with respiration noted  Abdomen: Soft, gravid, appropriate for gestational age. Pain/Pressure: Absent     Pelvic: Vag. Bleeding: None     Cervical exam deferred        Extremities: Normal range of motion.  Edema: Trace  Mental Status: Normal mood and affect. Normal behavior. Normal judgment and thought content.   Urinalysis:      Assessment and Plan:  Pregnancy: [id number] at [redacted]w[redacted]d  1. Supervision of high risk pregnancy in second trimester BP and FHRs normal Round ligament pain, reviewed etiology, recommended maternity belt Trial pepcid for GERD Discussed contraception at length, does not desire more children Considering post placental IUD, referred to bedsider  2.  Dichorionic diamniotic twin pregnancy in second trimester Anatomy scan today Discussed increased antenatal testing during pregnancy  Preterm labor symptoms and general obstetric precautions including but not limited to vaginal bleeding, contractions, leaking of fluid and fetal movement were reviewed in detail with the patient. Please refer to After Visit Summary for other counseling recommendations.  Return in 4 weeks (on 03/07/2021).   Venora Maples, MD

## 2021-02-07 NOTE — Patient Instructions (Signed)
 Contraception Choices Contraception, also called birth control, refers to methods or devices that prevent pregnancy. Hormonal methods Contraceptive implant A contraceptive implant is a thin, plastic tube that contains a hormone that prevents pregnancy. It is different from an intrauterine device (IUD). It is inserted into the upper part of the arm by a health care provider. Implants can be effective for up to 3 years. Progestin-only injections Progestin-only injections are injections of progestin, a synthetic form of the hormone progesterone. They are given every 3 months by a health care provider. Birth control pills Birth control pills are pills that contain hormones that prevent pregnancy. They must be taken once a day, preferably at the same time each day. A prescription is needed to use this method of contraception. Birth control patch The birth control patch contains hormones that prevent pregnancy. It is placed on the skin and must be changed once a week for three weeks and removed on the fourth week. A prescription is needed to use this method of contraception. Vaginal ring A vaginal ring contains hormones that prevent pregnancy. It is placed in the vagina for three weeks and removed on the fourth week. After that, the process is repeated with a new ring. A prescription is needed to use this method of contraception. Emergency contraceptive Emergency contraceptives prevent pregnancy after unprotected sex. They come in pill form and can be taken up to 5 days after sex. They work best the sooner they are taken after having sex. Most emergency contraceptives are available without a prescription. This method should not be used as your only form of birth control.   Barrier methods Female condom A female condom is a thin sheath that is worn over the penis during sex. Condoms keep sperm from going inside a woman's body. They can be used with a sperm-killing substance (spermicide) to increase their  effectiveness. They should be thrown away after one use. Female condom A female condom is a soft, loose-fitting sheath that is put into the vagina before sex. The condom keeps sperm from going inside a woman's body. They should be thrown away after one use. Diaphragm A diaphragm is a soft, dome-shaped barrier. It is inserted into the vagina before sex, along with a spermicide. The diaphragm blocks sperm from entering the uterus, and the spermicide kills sperm. A diaphragm should be left in the vagina for 6-8 hours after sex and removed within 24 hours. A diaphragm is prescribed and fitted by a health care provider. A diaphragm should be replaced every 1-2 years, after giving birth, after gaining more than 15 lb (6.8 kg), and after pelvic surgery. Cervical cap A cervical cap is a round, soft latex or plastic cup that fits over the cervix. It is inserted into the vagina before sex, along with spermicide. It blocks sperm from entering the uterus. The cap should be left in place for 6-8 hours after sex and removed within 48 hours. A cervical cap must be prescribed and fitted by a health care provider. It should be replaced every 2 years. Sponge A sponge is a soft, circular piece of polyurethane foam with spermicide in it. The sponge helps block sperm from entering the uterus, and the spermicide kills sperm. To use it, you make it wet and then insert it into the vagina. It should be inserted before sex, left in for at least 6 hours after sex, and removed and thrown away within 30 hours. Spermicides Spermicides are chemicals that kill or block sperm from entering the   cervix and uterus. They can come as a cream, jelly, suppository, foam, or tablet. A spermicide should be inserted into the vagina with an applicator at least 10-15 minutes before sex to allow time for it to work. The process must be repeated every time you have sex. Spermicides do not require a prescription.   Intrauterine  contraception Intrauterine device (IUD) An IUD is a T-shaped device that is put in a woman's uterus. There are two types:  Hormone IUD.This type contains progestin, a synthetic form of the hormone progesterone. This type can stay in place for 3-5 years.  Copper IUD.This type is wrapped in copper wire. It can stay in place for 10 years. Permanent methods of contraception Female tubal ligation In this method, a woman's fallopian tubes are sealed, tied, or blocked during surgery to prevent eggs from traveling to the uterus. Hysteroscopic sterilization In this method, a small, flexible insert is placed into each fallopian tube. The inserts cause scar tissue to form in the fallopian tubes and block them, so sperm cannot reach an egg. The procedure takes about 3 months to be effective. Another form of birth control must be used during those 3 months. Female sterilization This is a procedure to tie off the tubes that carry sperm (vasectomy). After the procedure, the man can still ejaculate fluid (semen). Another form of birth control must be used for 3 months after the procedure. Natural planning methods Natural family planning In this method, a couple does not have sex on days when the woman could become pregnant. Calendar method In this method, the woman keeps track of the length of each menstrual cycle, identifies the days when pregnancy can happen, and does not have sex on those days. Ovulation method In this method, a couple avoids sex during ovulation. Symptothermal method This method involves not having sex during ovulation. The woman typically checks for ovulation by watching changes in her temperature and in the consistency of cervical mucus. Post-ovulation method In this method, a couple waits to have sex until after ovulation. Where to find more information  Centers for Disease Control and Prevention: www.cdc.gov Summary  Contraception, also called birth control, refers to methods or  devices that prevent pregnancy.  Hormonal methods of contraception include implants, injections, pills, patches, vaginal rings, and emergency contraceptives.  Barrier methods of contraception can include female condoms, female condoms, diaphragms, cervical caps, sponges, and spermicides.  There are two types of IUDs (intrauterine devices). An IUD can be put in a woman's uterus to prevent pregnancy for 3-5 years.  Permanent sterilization can be done through a procedure for males and females. Natural family planning methods involve nothaving sex on days when the woman could become pregnant. This information is not intended to replace advice given to you by your health care provider. Make sure you discuss any questions you have with your health care provider. Document Revised: 02/08/2020 Document Reviewed: 02/08/2020 Elsevier Patient Education  2021 [employer]   Breastfeeding  Choosing to breastfeed is one of the best decisions you can make for yourself and your baby. A change in hormones during pregnancy causes your breasts to make breast milk in your milk-producing glands. Hormones prevent breast milk from being released before your baby is born. They also prompt milk flow after birth. Once breastfeeding has begun, thoughts of your baby, as well as his or her sucking or crying, can stimulate the release of milk from your milk-producing glands. Benefits of breastfeeding Research shows that breastfeeding offers many health benefits   for infants and mothers. It also offers a cost-free and convenient way to feed your baby. For your baby  Your first milk (colostrum) helps your baby's digestive system to function better.  Special cells in your milk (antibodies) help your baby to fight off infections.  Breastfed babies are less likely to develop asthma, allergies, obesity, or type 2 diabetes. They are also at lower risk for sudden infant death syndrome (SIDS).  Nutrients in breast milk are better  able to meet your baby's needs compared to infant formula.  Breast milk improves your baby's brain development. For you  Breastfeeding helps to create a very special bond between you and your baby.  Breastfeeding is convenient. Breast milk costs nothing and is always available at the correct temperature.  Breastfeeding helps to burn calories. It helps you to lose the weight that you gained during pregnancy.  Breastfeeding makes your uterus return faster to its size before pregnancy. It also slows bleeding (lochia) after you give birth.  Breastfeeding helps to lower your risk of developing type 2 diabetes, osteoporosis, rheumatoid arthritis, cardiovascular disease, and breast, ovarian, uterine, and endometrial cancer later in life. Breastfeeding basics Starting breastfeeding  Find a comfortable place to sit or lie down, with your neck and back well-supported.  Place a pillow or a rolled-up blanket under your baby to bring him or her to the level of your breast (if you are seated). Nursing pillows are specially designed to help support your arms and your baby while you breastfeed.  Make sure that your baby's tummy (abdomen) is facing your abdomen.  Gently massage your breast. With your fingertips, massage from the outer edges of your breast inward toward the nipple. This encourages milk flow. If your milk flows slowly, you may need to continue this action during the feeding.  Support your breast with 4 fingers underneath and your thumb above your nipple (make the letter "C" with your hand). Make sure your fingers are well away from your nipple and your baby's mouth.  Stroke your baby's lips gently with your finger or nipple.  When your baby's mouth is open wide enough, quickly bring your baby to your breast, placing your entire nipple and as much of the areola as possible into your baby's mouth. The areola is the colored area around your nipple. ? More areola should be visible above your  baby's upper lip than below the lower lip. ? Your baby's lips should be opened and extended outward (flanged) to ensure an adequate, comfortable latch. ? Your baby's tongue should be between his or her lower gum and your breast.  Make sure that your baby's mouth is correctly positioned around your nipple (latched). Your baby's lips should create a seal on your breast and be turned out (everted).  It is common for your baby to suck about 2-3 minutes in order to start the flow of breast milk. Latching Teaching your baby how to latch onto your breast properly is very important. An improper latch can cause nipple pain, decreased milk supply, and poor weight gain in your baby. Also, if your baby is not latched onto your nipple properly, he or she may swallow some air during feeding. This can make your baby fussy. Burping your baby when you switch breasts during the feeding can help to get rid of the air. However, teaching your baby to latch on properly is still the best way to prevent fussiness from swallowing air while breastfeeding. Signs that your baby has successfully latched onto   your nipple  Silent tugging or silent sucking, without causing you pain. Infant's lips should be extended outward (flanged).  Swallowing heard between every 3-4 sucks once your milk has started to flow (after your let-down milk reflex occurs).  Muscle movement above and in front of his or her ears while sucking. Signs that your baby has not successfully latched onto your nipple  Sucking sounds or smacking sounds from your baby while breastfeeding.  Nipple pain. If you think your baby has not latched on correctly, slip your finger into the corner of your baby's mouth to break the suction and place it between your baby's gums. Attempt to start breastfeeding again. Signs of successful breastfeeding Signs from your baby  Your baby will gradually decrease the number of sucks or will completely stop sucking.  Your baby  will fall asleep.  Your baby's body will relax.  Your baby will retain a small amount of milk in his or her mouth.  Your baby will let go of your breast by himself or herself. Signs from you  Breasts that have increased in firmness, weight, and size 1-3 hours after feeding.  Breasts that are softer immediately after breastfeeding.  Increased milk volume, as well as a change in milk consistency and color by the fifth day of breastfeeding.  Nipples that are not sore, cracked, or bleeding. Signs that your baby is getting enough milk  Wetting at least 1-2 diapers during the first 24 hours after birth.  Wetting at least 5-6 diapers every 24 hours for the first week after birth. The urine should be clear or pale yellow by the age of 5 days.  Wetting 6-8 diapers every 24 hours as your baby continues to grow and develop.  At least 3 stools in a 24-hour period by the age of 5 days. The stool should be soft and yellow.  At least 3 stools in a 24-hour period by the age of 7 days. The stool should be seedy and yellow.  No loss of weight greater than 10% of birth weight during the first 3 days of life.  Average weight gain of 4-7 oz (113-198 g) per week after the age of 4 days.  Consistent daily weight gain by the age of 5 days, without weight loss after the age of 2 weeks. After a feeding, your baby may spit up a small amount of milk. This is normal. Breastfeeding frequency and duration Frequent feeding will help you make more milk and can prevent sore nipples and extremely full breasts (breast engorgement). Breastfeed when you feel the need to reduce the fullness of your breasts or when your baby shows signs of hunger. This is called "breastfeeding on demand." Signs that your baby is hungry include:  Increased alertness, activity, or restlessness.  Movement of the head from side to side.  Opening of the mouth when the corner of the mouth or cheek is stroked (rooting).  Increased  sucking sounds, smacking lips, cooing, sighing, or squeaking.  Hand-to-mouth movements and sucking on fingers or hands.  Fussing or crying. Avoid introducing a pacifier to your baby in the first 4-6 weeks after your baby is born. After this time, you may choose to use a pacifier. Research has shown that pacifier use during the first year of a baby's life decreases the risk of sudden infant death syndrome (SIDS). Allow your baby to feed on each breast as long as he or she wants. When your baby unlatches or falls asleep while feeding from the   first breast, offer the second breast. Because newborns are often sleepy in the first few weeks of life, you may need to awaken your baby to get him or her to feed. Breastfeeding times will vary from baby to baby. However, the following rules can serve as a guide to help you make sure that your baby is properly fed:  Newborns (babies 4 weeks of age or younger) may breastfeed every 1-3 hours.  Newborns should not go without breastfeeding for longer than 3 hours during the day or 5 hours during the night.  You should breastfeed your baby a minimum of 8 times in a 24-hour period. Breast milk pumping Pumping and storing breast milk allows you to make sure that your baby is exclusively fed your breast milk, even at times when you are unable to breastfeed. This is especially important if you go back to work while you are still breastfeeding, or if you are not able to be present during feedings. Your lactation consultant can help you find a method of pumping that works best for you and give you guidelines about how long it is safe to store breast milk.      Caring for your breasts while you breastfeed Nipples can become dry, cracked, and sore while breastfeeding. The following recommendations can help keep your breasts moisturized and healthy:  Avoid using soap on your nipples.  Wear a supportive bra designed especially for nursing. Avoid wearing underwire-style  bras or extremely tight bras (sports bras).  Air-dry your nipples for 3-4 minutes after each feeding.  Use only cotton bra pads to absorb leaked breast milk. Leaking of breast milk between feedings is normal.  Use lanolin on your nipples after breastfeeding. Lanolin helps to maintain your skin's normal moisture barrier. Pure lanolin is not harmful (not toxic) to your baby. You may also hand express a few drops of breast milk and gently massage that milk into your nipples and allow the milk to air-dry. In the first few weeks after giving birth, some women experience breast engorgement. Engorgement can make your breasts feel heavy, warm, and tender to the touch. Engorgement peaks within 3-5 days after you give birth. The following recommendations can help to ease engorgement:  Completely empty your breasts while breastfeeding or pumping. You may want to start by applying warm, moist heat (in the shower or with warm, water-soaked hand towels) just before feeding or pumping. This increases circulation and helps the milk flow. If your baby does not completely empty your breasts while breastfeeding, pump any extra milk after he or she is finished.  Apply ice packs to your breasts immediately after breastfeeding or pumping, unless this is too uncomfortable for you. To do this: ? Put ice in a plastic bag. ? Place a towel between your skin and the bag. ? Leave the ice on for 20 minutes, 2-3 times a day.  Make sure that your baby is latched on and positioned properly while breastfeeding. If engorgement persists after 48 hours of following these recommendations, contact your health care provider or a lactation consultant. Overall health care recommendations while breastfeeding  Eat 3 healthy meals and 3 snacks every day. Well-nourished mothers who are breastfeeding need an additional 450-500 calories a day. You can meet this requirement by increasing the amount of a balanced diet that you eat.  Drink  enough water to keep your urine pale yellow or clear.  Rest often, relax, and continue to take your prenatal vitamins to prevent fatigue, stress, and low   vitamin and mineral levels in your body (nutrient deficiencies).  Do not use any products that contain nicotine or tobacco, such as cigarettes and e-cigarettes. Your baby may be harmed by chemicals from cigarettes that pass into breast milk and exposure to secondhand smoke. If you need help quitting, ask your health care provider.  Avoid alcohol.  Do not use illegal drugs or marijuana.  Talk with your health care provider before taking any medicines. These include over-the-counter and prescription medicines as well as vitamins and herbal supplements. Some medicines that may be harmful to your baby can pass through breast milk.  It is possible to become pregnant while breastfeeding. If birth control is desired, ask your health care provider about options that will be safe while breastfeeding your baby. Where to find more information: [employer]: www.llli.org Contact a health care provider if:  You feel like you want to stop breastfeeding or have become frustrated with breastfeeding.  Your nipples are cracked or bleeding.  Your breasts are red, tender, or warm.  You have: ? Painful breasts or nipples. ? A swollen area on either breast. ? A fever or chills. ? Nausea or vomiting. ? Drainage other than breast milk from your nipples.  Your breasts do not become full before feedings by the fifth day after you give birth.  You feel sad and depressed.  Your baby is: ? Too sleepy to eat well. ? Having trouble sleeping. ? More than 1 week old and wetting fewer than 6 diapers in a 24-hour period. ? Not gaining weight by 5 days of age.  Your baby has fewer than 3 stools in a 24-hour period.  Your baby's skin or the white parts of his or her eyes become yellow. Get help right away if:  Your baby is overly tired  (lethargic) and does not want to wake up and feed.  Your baby develops an unexplained fever. Summary  Breastfeeding offers many health benefits for infant and mothers.  Try to breastfeed your infant when he or she shows early signs of hunger.  Gently tickle or stroke your baby's lips with your finger or nipple to allow the baby to open his or her mouth. Bring the baby to your breast. Make sure that much of the areola is in your baby's mouth. Offer one side and burp the baby before you offer the other side.  Talk with your health care provider or lactation consultant if you have questions or you face problems as you breastfeed. This information is not intended to replace advice given to you by your health care provider. Make sure you discuss any questions you have with your health care provider. Document Revised: 11/28/2017 Document Reviewed: 10/05/2016 Elsevier Patient Education  2021 [employer].  

## 2021-02-07 NOTE — Progress Notes (Unsigned)
MFM Note  Sara Manning was seen due to a spontaneously conceived twin pregnancy.  She denies any significant past medical history and denies any problems in her current pregnancy.     The patient had a cell free DNA test earlier in her pregnancy which indicated a low risk for trisomy 69, 18, and 13.  These are predicted to be dizygotic/fraternal twins.  Two female fetuses are predicted.  A thick dividing membrane was noted separating the two fetuses, indicating that these are dichorionic, diamniotic twins.  The fetal growth and amniotic fluid level appeared appropriate for both twin A and twin B.    There were no obvious anomalies noted in either twin A or twin B today.  However the views of the fetal anatomy were limited today due to the fetal positions.  The limitations of ultrasound in the detection of all anomalies was discussed today.  The management of dichorionic twins was discussed.  She was advised that management of twin pregnancies will involve frequent ultrasound exams to assess the fetal growth and amniotic fluid level. We will continue to follow her with serial growth ultrasounds.  Weekly fetal testing for dichorionic twins should start at around 36 weeks.  Delivery for uncomplicated dichorionic twins should occur at around 38 weeks.  The increased risk of preeclampsia, gestational diabetes, and preterm birth/labor associated with twin pregnancies was discussed.  She was advised that she will continue to be followed closely to assess for these conditions. As pregnancies with multiple gestations are at increased risk for developing preeclampsia, she was advised to start taking a daily baby aspirin (81 mg per day) to decrease her risk of developing preeclampsia  as soon as possible.  A follow-up exam was scheduled in 4 weeks to assess the fetal growth and to complete the views of the fetal anatomy.   A total of 30 minutes was spent counseling and coordinating the care for this  patient.  Greater than 50% of the time spent in direct face-to-face contact.

## 2021-02-08 ENCOUNTER — Ambulatory Visit: Payer: Medicaid Other | Admitting: Clinical

## 2021-02-08 DIAGNOSIS — Z658 Other specified problems related to psychosocial circumstances: Secondary | ICD-10-CM

## 2021-02-08 NOTE — Patient Instructions (Signed)
[facility] for [facility] for Women [place], [place] [address] [phone] (main office) [phone] [facility] office)  [email or web address]      BRAINSTORMING  Develop a Plan Goals: . Provide a way to start conversation about your new life with a baby . Assist parents in recognizing and using resources within their reach . Help pave the way before birth for an easier period of transition afterwards.  Make a list of the following information to keep in a central location: . Full name of Mom and Partner: _____________________________________________ . 68 full name and Date of Birth: ___________________________________________ . Home Address: ___________________________________________________________ ________________________________________________________________________ . Home Phone: ____________________________________________________________ . Parents' cell numbers: _____________________________________________________ ________________________________________________________________________ . Name and contact info for OB: ______________________________________________ . Name and contact info for Pediatrician:________________________________________ . Contact info for Lactation Consultants: ________________________________________  REST and SLEEP *You each need at least 4-5 hours of uninterrupted sleep every day. Write specific names and contact information.* . How are you going to rest in the postpartum period? While partner's home? When partner returns to work? When you both return to work? Marland Kitchen Where will your baby sleep? Marland Kitchen Who is available to help during the day? Evening? Night? . Who could move in for a period to help support you? Marland Kitchen What are some ideas to help you get enough  sleep? __________________________________________________________________________________________________________________________________________________________________________________________________________________________________________ NUTRITIOUS FOOD AND DRINK *Plan for meals before your baby is born so you can have healthy food to eat during the immediate postpartum period.* . Who will look after breakfast? Lunch? Dinner? List names and contact information. Brainstorm quick, healthy ideas for each meal. . What can you do before baby is born to prepare meals for the postpartum period? . How can others help you with meals? Marland Kitchen Which grocery stores provide online shopping and delivery? Marland Kitchen Which restaurants offer take-out or delivery options? ______________________________________________________________________________________________________________________________________________________________________________________________________________________________________________________________________________________________________________________________________________________________________________________________________  CARE FOR MOM *It's important that mom is cared for and pampered in the postpartum period. Remember, the most important ways new mothers need care are: sleep, nutrition, gentle exercise, and time off.* . Who can come take care of mom during this period? Make a list of people with their contact information. . List some activities that make you feel cared for, rested, and energized? Who can make sure you have opportunities to do these things? . Does mom have a space of her very own within your home that's just for her? Make a "[facility]" where she can be comfortable, rest, and renew herself  daily. ______________________________________________________________________________________________________________________________________________________________________________________________________________________________________________________________________________________________________________________________________________________________________________________________________    CARE FOR AND FEEDING BABY *Knowledgeable and encouraging people will offer the best support with regard to feeding your baby.* . Educate yourself and choose the best feeding option for your baby. . Make a list of people who will guide, support, and be a resource for you as your care for and feed your baby. (Friends that have breastfed or are currently breastfeeding, lactation consultants, breastfeeding support groups, etc.) . Consider a postpartum doula. (These websites can give you information: dona.org & BuyingShow.es) . Seek out local breastfeeding resources like the breastfeeding support group at [employer] or [employer]. ______________________________________________________________________________________________________________________________________________________________________________________________________________________________________________________________________________________________________________________________________________________________________________________________________  Verner Chol AND ERRANDS . Who can help with a thorough cleaning before baby is born? . Make a list of people who will help with housekeeping and chores, like laundry, light cleaning, dishes, bathrooms, etc. . Who can run some errands for you? Marland Kitchen What can you do to make sure you are stocked with basic supplies before baby is born? . Who is going  to do the  shopping? ______________________________________________________________________________________________________________________________________________________________________________________________________________________________________________________________________________________________________________________________________________________________________________________________________     Family Adjustment *Nurture yourselves.it helps parents be more loving and allows for better bonding with their child.* . What sorts of things do you and partner enjoy doing together? Which activities help you to connect and strengthen your relationship? Make a list of those things. Make a list of people whom you trust to care for your baby so you can have some time together as a couple. . What types of things help partner feel connected to Mom? Make a list. . What needs will partner have in order to bond with baby? . Other children? Who will care for them when you go into labor and while you are in the hospital? . Think about what the needs of your older children might be. Who can help you meet those needs? In what ways are you helping them prepare for bringing baby home? List some specific strategies you have for family adjustment. _______________________________________________________________________________________________________________________________________________________________________________________________________________________________________________________________________________________________________________________________________________  SUPPORT *Someone who can empathize with experiences normalizes your problems and makes them more bearable.* . Make a list of other friends, neighbors, and/or co-workers you know with infants (and small children, if applicable) with whom you can connect. . Make a list of local or online support groups, mom groups, etc. in which you can be  involved. ______________________________________________________________________________________________________________________________________________________________________________________________________________________________________________________________________________________________________________________________________________________________________________________________________  Childcare Plans . Investigate and plan for childcare if mom is returning to work. . Talk about mom's concerns about her transition back to work. . Talk about partner's concerns regarding this transition.  Mental Health *Your mental health is one of the highest priorities for a pregnant or postpartum mom.* . 1 in 5 women experience anxiety and/or depression from the time of conception through the first year after birth. . Postpartum Mood Disorders are the #1 complication of pregnancy and childbirth and the suffering experienced by these mothers is not necessary! These illnesses are temporary and respond well to treatment, which often includes self-care, social support, talk therapy, and medication when needed. . Women experiencing anxiety and depression often say things like: "I'm supposed to be happy.why do I feel so sad?", "Why can't I snap out of it?", "I'm having thoughts that scare me." . There is no need to be embarrassed if you are feeling these symptoms: o Overwhelmed, anxious, angry, sad, guilty, irritable, hopeless, exhausted but can't sleep o You are NOT alone. You are NOT to blame. With help, you WILL be well. . Where can I find help? Medical professionals such as your OB, midwife, gynecologist, family practitioner, primary care provider, pediatrician, or mental health providers; [facility] support groups: Feelings After Birth, Breastfeeding Support Group, Baby and Me Group, and Fit 4 Two exercise classes. . You have permission to ask for help. It will confirm your feelings, validate your  experiences, share/learn coping strategies, and gain support and encouragement as you heal. You are important! BRAINSTORM . Make a list of local resources, including resources for mom and for partner. . Identify support groups. . Identify people to call late at night - include names and contact info. . Talk with partner about perinatal mood and anxiety disorders. . Talk with your OB, midwife, and doula about baby blues and about perinatal mood and anxiety disorders. . Talk with your pediatrician about perinatal mood and anxiety disorders.   Support & Sanity Savers   What do you really need?  . Basics . In preparing for a new baby, many expectant parents spend hours shopping for baby clothes, decorating the nursery, and deciding  which car seat to buy. Yet most don't think much about what the reality of parenting a newborn will be like, and what they need to make it through that. So, here is the advice of experienced parents. We know you'll read this, and think "they're exaggerating, I don't really need that." Just trust [place] on these, OK? Plan for all of this, and if it turns out you don't need it, come back and teach [place] how you did it!  Satira Anis (Once baby's survival needs are met, make sure you attend to your own survival needs!) . Sleep . An average newborn sleeps 16-18 hours per day, over 6-7 sleep periods, rarely more than three hours at a time. It is normal and healthy for a newborn to wake throughout the night... but really hard on parents!! . Naps. Prioritize sleep above any responsibilities like: cleaning house, visiting friends, running errands, etc.  Sleep whenever baby sleeps. If you can't nap, at least have restful times when baby eats. The more rest you get, the more patient you will be, the more emotionally stable, and better at solving problems.  . Food . You may not have realized it would be difficult to eat when you have a newborn. Yet, when we talk to . countless new  parents, they say things like "it may be 2:00 pm when I realize I haven't had breakfast yet." Or "every time we sit down to dinner, baby needs to eat, and my food gets cold, so I don't bother to eat it." . Finger food. Before your baby is born, stock up with one months' worth of food that: 1) you can eat with one hand while holding a baby, 2) doesn't need to be prepped, 3) is good hot or cold, 4) doesn't spoil when left out for a few hours, and 5) you like to eat. Think about: nuts, dried fruit, Clif bars, pretzels, jerky, gogurt, baby carrots, apples, bananas, crackers, cheez-n-crackers, string cheese, hot pockets or frozen burritos to microwave, garden burgers and breakfast pastries to put in the toaster, yogurt drinks, etc. . Restaurant Menus. Make lists of your favorite restaurants & menu items. When family/friends want to help, you can give specific information without much thought. They can either bring you the food or send gift cards for just the right meals. Rosaura Carpenter Meals.  Take some time to make a few meals to put in the freezer ahead of time.  Easy to freeze meals can be anything such as soup, lasagna, chicken pie, or spaghetti sauce. . Set up a Meal Schedule.  Ask friends and family to sign up to bring you meals during the first few weeks of being home. (It can be passed around at baby showers!) You have no idea how helpful this will be until you are in the throes of parenting.  https://hamilton-woodard.[email or web address] is a great website to check out. . Emotional Support . Know who to call when you're stressed out. Parenting a newborn is very challenging work. There are times when it totally overwhelms your normal coping abilities. EVERY NEW PARENT NEEDS TO HAVE A PLAN FOR WHO TO CALL WHEN THEY JUST CAN'T COPE ANY MORE. (And it has to be someone other than the baby's other parent!) Before your baby is born, come up with at least one person you can call for support - write their phone number down and post it on the  refrigerator. Marland Kitchen Anxiety & Sadness. Baby blues are normal after pregnancy; however, there are more  severe types of anxiety & sadness which can occur and should not be ignored.  They are always treatable, but you have to take the first step by reaching out for help. [place] offers a "Mom Talk" group which meets every Tuesday from 10 am - 11 am.  This group is for new moms who need support and connection after their babies are born.  Call [phone].  Marland Kitchen Really, Really Helpful (Plan for them! Make sure these happen often!!) . Physical Support with Taking Care of Yourselves . Asking friends and family. Before your baby is born, set up a schedule of people who can come and visit and help out (or ask a friend to schedule for you). Any time someone says "let me know what I can do to help," sign them up for a day. When they get there, their job is not to take care of the baby (that's your job and your joy). Their job is to take care of you!  . Postpartum doulas. If you don't have anyone you can call on for support, look into postpartum doulas:  professionals at helping parents with caring for baby, caring for themselves, getting breastfeeding started, and helping with household tasks. www.padanc.org is a helpful website for learning about doulas in our area. . Peer Support / Parent Groups . Why: One of the greatest ideas for new parents is to be around other new parents. Parent groups give you a chance to share and listen to others who are going through the same season of life, get a sense of what is normal infant development by watching several babies learn and grow, share your stories of triumph and struggles with empathetic ears, and forgive your own mistakes when you realize all parents are learning by trial and error. . Where to find: There are many places you can meet other new parents throughout our community.  [facility] offers the following classes for new moms and their little ones:  Baby  and Me (Birth to Long Barn) and Breastfeeding Support Group. Go to www.[email or web address] or call [phone] for more information. . Time for your Relationship . It's easy to get so caught up in meeting baby's immediate needs that it's hard to find time to connect with your partner, and meet the needs of your relationship. It's also easy to forget what "quality time with your partner" actually looks like. If you take your baby on a date, you'd be amazed how much of your couple time is spent feeding the baby, diapering the baby, admiring the baby, and talking about the baby. . Dating: Try to take time for just the two of you. Babysitter tip: Sometimes when moms are breastfeeding a newborn, they find it hard to figure out how to schedule outings around baby's unpredictable feeding schedules. Have the babysitter come for a three hour period. When she comes over, if baby has just eaten, you can leave right away, and come back in two hours. If baby hasn't fed recently, you start the date at home. Once baby gets hungry and gets a good feeding in, you can head out for the rest of your date time. . Date Nights at Home: If you can't get out, at least set aside one evening a week to prioritize your relationship: whenever baby dozes off or doesn't have any immediate needs, spend a little time focusing on each other. . Potential conflicts: The main relationship conflicts that come up for new parents are: issues related to sexuality, financial stresses, a  feeling of an unfair division of household tasks, and conflicts in parenting styles. The more you can work on these issues before baby arrives, the better!  Clint Guy and Frills (Don't forget these. and don't feel guilty for indulging in them!) . Everyone has something in life that is a fun little treat that they do just for themselves. It may be: reading the morning paper, or going for a daily jog, or having coffee with a friend once a week, or going to a movie on Friday  nights, or fine chocolates, or bubble baths, or curling up with a good book. . Unless you do fun things for yourself every now and then, it's hard to have the energy for fun with your baby. Whatever your "special" treats are, make sure you find a way to continue to indulge in them after your baby is born. These special moments can recharge you, and allow you to return to baby with a new joy   PERINATAL MOOD DISORDERS: [place]   Emergency and Crisis Resources:  If you are an imminent risk to self or others, are experiencing intense personal distress, and/or have noticed significant changes in activities of daily living, call:  . 911 . [facility]: ([phone] . Mobile Crisis: (769)805-7830 . National Suicide Hotline: 224-493-0881 Or visit the following crisis centers: . Local Emergency Departments . [facility]: [address], [place]. Hours: 8:30AM-5PM. Insurance Accepted: Medicaid, Medicare, and Uninsured.  Marland Kitchen [facility]  [address] [place] Mon-Friday 8am-3pm  [phone]                                                                                    Non-Crisis Resources: To identify specific providers that are covered by your insurance, contact your insurance company or local agencies: [place]: [phone] [facility] and [place]: [phone] [place]: [phone] Postpartum Support International- [place] 1-4103316717                                                      Outpatient therapy and medication management providers:  [facility] [phone] Hours: 9AM-5PM  Insurance Accepted: Alben Spittle, Lorella Nimrod, Freddrick March, [place], Medicare  [facility]) [phone] Hours: 8AM-5PM  nsurance Accepted: All insurances EXCEPT AARP, [place], [place], and  [place]: [phone]             Hours: 8AM-8PM Insurance Accepted: Cristal Ford, Freddrick March, [place], Medicare, [phone] [facility]: [phone] Hours: 8:30AM-7PM Insurance Accepted: Cristal Ford, Medicaid, Medicare, Tricare, [employer] Counseling:  [place] Accepted:  Holland Falling, Lorella Nimrod, [employer], [employer], [facility] [phone] Hours: 9AM-5:30PM Insurance Accepted: Alben Spittle, Charlotte Crumb, and Medicaid, [employer], [employer] [facility] Counseling:  [phone] Hours: 9am-5pm Insurance Accepted: BCBS;  they do not accept Medicaid/Medicare [facility]: [phone] Hours: 9am-9pm Insurance Accepted: All major insurance including Medicaid and Medicare [facility]: [phone] Hours: 9AM-5:30PM Insurance Accepted: All insurances EXCEPT Medicaid and Medicare. [facility]: [place]: [phone] [place]:  [place] (support for children in the NICU and/or with special needs), [place] [employer]: [phone]                                                                                     Online Resources: Postpartum Support International: http://jones-berg.com/  800-944-4PPD 2Moms Supporting Moms:  www.momssupportingmoms.net

## 2021-02-13 ENCOUNTER — Other Ambulatory Visit: Payer: Self-pay | Admitting: Family Medicine

## 2021-02-14 ENCOUNTER — Other Ambulatory Visit: Payer: Self-pay | Admitting: Family Medicine

## 2021-02-15 ENCOUNTER — Other Ambulatory Visit: Payer: Self-pay | Admitting: Student

## 2021-02-15 MED ORDER — TERCONAZOLE 0.4 % VA CREA: 1.0000 | TOPICAL_CREAM | Freq: Every day | VAGINAL | 0 refills | Status: DC

## 2021-02-15 MED ORDER — TIZANIDINE HCL 2 MG PO TABS: 2.0000 mg | ORAL_TABLET | Freq: Every day | ORAL | 0 refills | Status: DC

## 2021-02-15 NOTE — Addendum Note (Signed)
Addended by: Moses Manners on: 02/15/2021 12:23 PM   Modules accepted: Orders

## 2021-02-16 ENCOUNTER — Telehealth: Payer: Self-pay

## 2021-02-16 NOTE — Telephone Encounter (Signed)
Pt request to return call back asap.  Called pt and pt informed me that she has had a rash on the side of her belly that started 3 days ago.  Pt states that she not tried any otc medication.  I advised pt to try Benadryl as she may had an allergic reaction and to apply hydrocortisone cream to help with itching.  I informed pt that I have scheduled her an appt for June 9th @ 1355 and at that point we will evaluate her rash to see if what she is doing at home is effective.  Pt verbalized understanding.   Addison Naegeli, RN  02/16/21

## 2021-02-23 ENCOUNTER — Ambulatory Visit (INDEPENDENT_AMBULATORY_CARE_PROVIDER_SITE_OTHER): Payer: Medicaid Other | Admitting: Obstetrics and Gynecology

## 2021-02-23 ENCOUNTER — Other Ambulatory Visit: Payer: Self-pay

## 2021-02-23 VITALS — BP 105/63 | HR 89 | Wt 174.0 lb

## 2021-02-23 DIAGNOSIS — Z3A21 21 weeks gestation of pregnancy: Secondary | ICD-10-CM

## 2021-02-23 DIAGNOSIS — O2686 Pruritic urticarial papules and plaques of pregnancy (PUPPP): Secondary | ICD-10-CM

## 2021-02-23 DIAGNOSIS — O0992 Supervision of high risk pregnancy, unspecified, second trimester: Secondary | ICD-10-CM

## 2021-02-23 DIAGNOSIS — O30042 Twin pregnancy, dichorionic/diamniotic, second trimester: Secondary | ICD-10-CM

## 2021-02-23 MED ORDER — TRIAMCINOLONE ACETONIDE 0.5 % EX OINT: 1.0000 "application " | TOPICAL_OINTMENT | Freq: Two times a day (BID) | CUTANEOUS | 0 refills | Status: DC

## 2021-02-23 MED ORDER — HYDROXYZINE HCL 25 MG PO TABS: 25.0000 mg | ORAL_TABLET | Freq: Four times a day (QID) | ORAL | 2 refills | Status: DC | PRN

## 2021-02-23 NOTE — Progress Notes (Signed)
   PRENATAL VISIT NOTE  Subjective:  Sara Manning is a 35 y.o. [phone] at [redacted]w[redacted]d being seen today for ongoing prenatal care.  She is currently monitored for the following issues for this high-risk pregnancy and has Gastroesophageal reflux disease; Bipolar disorder, current episode depressed, moderate (HCC); Eczema; Migraine with aura and with status migrainosus, not intractable; Headache; Positive pregnancy test; Supervision of high-risk pregnancy; and Twin pregnancy on their problem list.  Patient reports  rash .  Contractions: Irritability. Vag. Bleeding: None.  Movement: Present. Denies leaking of fluid.   Rash on abdomen that is extremely itchy, ahs some bumps on legs as well. No one around her has a rash. No other associated symptoms.   Continues to have hip, leg cramping. Has an appointment with physical therapy on 6/14.  The following portions of the patient's history were reviewed and updated as appropriate: allergies, current medications, past family history, past medical history, past social history, past surgical history and problem list.   Objective:   Vitals:   02/23/21 1617  BP: 105/63  Pulse: 89  Weight: 174 lb (78.9 kg)    Fetal Status: Fetal Heart Rate (bpm): 147/144   Movement: Present     General:  Alert, oriented and cooperative. Patient is in no acute distress.  Skin: Skin is warm and dry. Pruritic papules that are mildly erythematous noted along span of abdomen. No ulcerations or induration. No open wounds.   Cardiovascular: Normal heart rate noted  Respiratory: Normal respiratory effort, no problems with respiration noted  Abdomen: Soft, gravid, appropriate for gestational age.  Pain/Pressure: Present     Pelvic: Cervical exam deferred        Extremities: Normal range of motion.  Edema: Trace  Mental Status: Normal mood and affect. Normal behavior. Normal judgment and thought content.   Assessment and Plan:  Pregnancy: G4P3003 at [redacted]w[redacted]d 1. Supervision of  high risk pregnancy in second trimester -doing well overall, encourage to continue gentle stretching, walking. Has f/u w PT on 6/14.   2. Dichorionic diamniotic twin pregnancy in second trimester   3. [redacted] weeks gestation of pregnancy    4. PUPPS -suspect Pupps. Discussed diagnosis w patient and provided w printout. Triamcinolone and Hydroxyzine sent to pharmacy.   Preterm labor symptoms and general obstetric precautions including but not limited to vaginal bleeding, contractions, leaking of fluid and fetal movement were reviewed in detail with the patient. Please refer to After Visit Summary for other counseling recommendations.   No follow-ups on file.  Future Appointments  Date Time Provider Department Center  02/28/2021  3:00 PM Theressa Millard, PT [facility]  03/10/2021 11:15 AM [facility] NURSE [facility]  03/10/2021 11:30 AM [facility] US3 [facility]  04/05/2021 10:15 AM [facility]IAN [facility]  05/09/2021  3:00 PM Theressa Millard, PT [facility]  05/16/2021  3:00 PM Theressa Millard, PT [facility]  05/23/2021 11:30 AM Theressa Millard, PT [facility]  05/30/2021 11:30 AM Theressa Millard, PT [facility]    Gita Kudo, MD

## 2021-02-28 ENCOUNTER — Encounter: Payer: Medicaid Other | Attending: Student | Admitting: Physical Therapy

## 2021-02-28 ENCOUNTER — Encounter: Payer: Self-pay | Admitting: Physical Therapy

## 2021-02-28 ENCOUNTER — Other Ambulatory Visit: Payer: Self-pay

## 2021-02-28 DIAGNOSIS — R102 Pelvic and perineal pain unspecified side: Secondary | ICD-10-CM

## 2021-02-28 DIAGNOSIS — R252 Cramp and spasm: Secondary | ICD-10-CM | POA: Diagnosis present

## 2021-02-28 DIAGNOSIS — M25551 Pain in right hip: Secondary | ICD-10-CM

## 2021-02-28 DIAGNOSIS — M6281 Muscle weakness (generalized): Secondary | ICD-10-CM | POA: Diagnosis not present

## 2021-02-28 NOTE — Therapy (Signed)
Plan: PT plan of care cert/re-cert  Pain in right hip - Plan: PT plan of care cert/re-cert  Cramp and spasm - Plan: PT plan of care cert/re-cert     Problem List Patient Active Problem List   Diagnosis Date Noted   Supervision of high-risk pregnancy 11/23/2020   Twin pregnancy 11/23/2020   Positive pregnancy test 11/14/2020   Headache 04/25/2020   Migraine with aura and with status migrainosus, not intractable 11/06/2019   Eczema 12/01/2015   Bipolar disorder, current episode depressed, moderate (HCC) 06/22/2011   Gastroesophageal reflux disease 04/30/2011    Eulis Fosterheryl Zanobia Griebel, PT 02/28/21 4:36 PM  [facility] [address], [address] [place], [place], [address] Phone: [phone]   Fax:  [phone]  Name: Sara Manning MRN: [id number] Date of  Birth: [birth year]  Plan: PT plan of care cert/re-cert  Pain in right hip - Plan: PT plan of care cert/re-cert  Cramp and spasm - Plan: PT plan of care cert/re-cert     Problem List Patient Active Problem List   Diagnosis Date Noted   Supervision of high-risk pregnancy 11/23/2020   Twin pregnancy 11/23/2020   Positive pregnancy test 11/14/2020   Headache 04/25/2020   Migraine with aura and with status migrainosus, not intractable 11/06/2019   Eczema 12/01/2015   Bipolar disorder, current episode depressed, moderate (HCC) 06/22/2011   Gastroesophageal reflux disease 04/30/2011    Eulis Fosterheryl Zanobia Griebel, PT 02/28/21 4:36 PM  [facility] [address], [address] [place], [place], [address] Phone: [phone]   Fax:  [phone]  Name: Sara Manning MRN: [id number] Date of  Birth: [birth year]  [facility] [address] [place], [place], [address] Phone: [phone]   Fax:  [phone]  Physical Therapy Evaluation  Patient Details  Name: Sara Manning MRN: [id number] Date of Birth: 1986/01/07 Referring Provider (PT): Charlesetta Garibaldi Kooistra   Encounter Date: 02/28/2021   PT End of Session - 02/28/21 1623     Visit Number 1    Date for PT Re-Evaluation 06/30/21    Authorization Type UHC Medicaid    PT Start Time 1500    PT Stop Time 1545    PT Time Calculation (min) 45 min    Activity Tolerance Patient tolerated treatment well;No increased pain    Behavior During Therapy WFL for tasks assessed/performed             Past Medical History:  Diagnosis Date   Bipolar 2 disorder (HCC)    Headache(784.0)    Mood change 11/06/2019    Past Surgical History:  Procedure Laterality Date   WISDOM TOOTH EXTRACTION      There were no vitals filed for this visit.    Subjective Assessment - 02/28/21 1508     Subjective Patient reports she has been hruting in the abdomen, pelvis and legs. Pain started beginning of pregnanacy. Due date 07/04/2021. Patinet had this pain with one of her pregnancies.    Patient Stated Goals reduce pain    Currently in Pain? Yes    Pain Score 10-Worst pain ever    Pain Location Pelvis    Pain Orientation Right;Lower    Pain Descriptors / Indicators Aching;Sharp    Pain Type Acute pain    Pain Onset More than a month ago    Pain Frequency Constant    Aggravating Factors  sit to stand and back and the right leg feels weak    Pain Relieving Factors Not sure    Multiple Pain Sites Yes    Pain Score 10    Pain Location Leg    Pain Orientation Right;Upper    Pain Descriptors / Indicators Aching    Pain Type Acute pain    Pain Onset More than a month ago    Pain Frequency Constant    Aggravating Factors  standing on right leg    Pain Relieving Factors not standing on  right leg                [facility] PT Assessment - 02/28/21 0001       Assessment   Referring Provider (PT) Charlesetta Garibaldi Kooistra    Onset Date/Surgical Date --   beginning of pregnancy     Precautions   Precautions Other (comment)    Precaution Comments pregnant due 07/04/2021      Restrictions   Weight Bearing Restrictions No      Balance Screen   Has the patient fallen in the past 6 months No    Has the patient had a decrease in activity level because of a fear of falling?  No    Is the patient reluctant to leave their home because of a fear of falling?  No      Home Environment   Living Environment Private residence    Type of Home House    Home Access Stairs to enter    Home Layout Two level    Alternate Level Stairs-Rails --   goes up and down stairs going sideways     Prior Function   Level of Independence Independent  [facility] [address] [place], [place], [address] Phone: [phone]   Fax:  [phone]  Physical Therapy Evaluation  Patient Details  Name: Sara Manning MRN: [id number] Date of Birth: 1986/01/07 Referring Provider (PT): Charlesetta Garibaldi Kooistra   Encounter Date: 02/28/2021   PT End of Session - 02/28/21 1623     Visit Number 1    Date for PT Re-Evaluation 06/30/21    Authorization Type UHC Medicaid    PT Start Time 1500    PT Stop Time 1545    PT Time Calculation (min) 45 min    Activity Tolerance Patient tolerated treatment well;No increased pain    Behavior During Therapy WFL for tasks assessed/performed             Past Medical History:  Diagnosis Date   Bipolar 2 disorder (HCC)    Headache(784.0)    Mood change 11/06/2019    Past Surgical History:  Procedure Laterality Date   WISDOM TOOTH EXTRACTION      There were no vitals filed for this visit.    Subjective Assessment - 02/28/21 1508     Subjective Patient reports she has been hruting in the abdomen, pelvis and legs. Pain started beginning of pregnanacy. Due date 07/04/2021. Patinet had this pain with one of her pregnancies.    Patient Stated Goals reduce pain    Currently in Pain? Yes    Pain Score 10-Worst pain ever    Pain Location Pelvis    Pain Orientation Right;Lower    Pain Descriptors / Indicators Aching;Sharp    Pain Type Acute pain    Pain Onset More than a month ago    Pain Frequency Constant    Aggravating Factors  sit to stand and back and the right leg feels weak    Pain Relieving Factors Not sure    Multiple Pain Sites Yes    Pain Score 10    Pain Location Leg    Pain Orientation Right;Upper    Pain Descriptors / Indicators Aching    Pain Type Acute pain    Pain Onset More than a month ago    Pain Frequency Constant    Aggravating Factors  standing on right leg    Pain Relieving Factors not standing on  right leg                [facility] PT Assessment - 02/28/21 0001       Assessment   Referring Provider (PT) Charlesetta Garibaldi Kooistra    Onset Date/Surgical Date --   beginning of pregnancy     Precautions   Precautions Other (comment)    Precaution Comments pregnant due 07/04/2021      Restrictions   Weight Bearing Restrictions No      Balance Screen   Has the patient fallen in the past 6 months No    Has the patient had a decrease in activity level because of a fear of falling?  No    Is the patient reluctant to leave their home because of a fear of falling?  No      Home Environment   Living Environment Private residence    Type of Home House    Home Access Stairs to enter    Home Layout Two level    Alternate Level Stairs-Rails --   goes up and down stairs going sideways     Prior Function   Level of Independence Independent

## 2021-02-28 NOTE — Patient Instructions (Signed)
Access Code: [email or web address] URL: [email or web address] Date: 02/28/2021 Prepared by: Eulis Foster  Program Notes lay on left side, 2 pillows between knees, pull the right hip back and forth 10x, 2x per day    Exercises Sidelying Anterior Pelvic Tilts - 2 x daily - 7 x weekly - 1 sets - 10 reps Sidelying Pelvic Floor Contraction with Self-Palpation - 2 x daily - 7 x weekly - 1 sets - 5 reps - 5 sec hold  Eulis Foster, PT [facility] [address], [address] [place], [place] [address] W: [phone] [email or web address]

## 2021-03-02 ENCOUNTER — Other Ambulatory Visit: Payer: Self-pay | Admitting: Obstetrics and Gynecology

## 2021-03-07 ENCOUNTER — Ambulatory Visit: Payer: Medicaid Other

## 2021-03-10 ENCOUNTER — Ambulatory Visit: Payer: Medicaid Other | Attending: Obstetrics

## 2021-03-10 ENCOUNTER — Other Ambulatory Visit: Payer: Self-pay | Admitting: *Deleted

## 2021-03-10 ENCOUNTER — Other Ambulatory Visit: Payer: Self-pay

## 2021-03-10 ENCOUNTER — Ambulatory Visit: Payer: Medicaid Other | Admitting: *Deleted

## 2021-03-10 ENCOUNTER — Encounter: Payer: Self-pay | Admitting: *Deleted

## 2021-03-10 VITALS — BP 109/67 | HR 87

## 2021-03-10 DIAGNOSIS — Z362 Encounter for other antenatal screening follow-up: Secondary | ICD-10-CM | POA: Diagnosis present

## 2021-03-10 DIAGNOSIS — O30042 Twin pregnancy, dichorionic/diamniotic, second trimester: Secondary | ICD-10-CM | POA: Insufficient documentation

## 2021-03-10 DIAGNOSIS — O99342 Other mental disorders complicating pregnancy, second trimester: Secondary | ICD-10-CM

## 2021-03-10 DIAGNOSIS — Z3A23 23 weeks gestation of pregnancy: Secondary | ICD-10-CM

## 2021-03-10 DIAGNOSIS — Z363 Encounter for antenatal screening for malformations: Secondary | ICD-10-CM | POA: Diagnosis not present

## 2021-03-18 ENCOUNTER — Other Ambulatory Visit: Payer: Self-pay | Admitting: Family Medicine

## 2021-03-20 ENCOUNTER — Other Ambulatory Visit: Payer: Self-pay | Admitting: Family Medicine

## 2021-03-20 DIAGNOSIS — Z3201 Encounter for pregnancy test, result positive: Secondary | ICD-10-CM

## 2021-03-22 ENCOUNTER — Encounter (HOSPITAL_COMMUNITY): Payer: Self-pay | Admitting: Obstetrics and Gynecology

## 2021-03-22 ENCOUNTER — Other Ambulatory Visit: Payer: Self-pay

## 2021-03-22 ENCOUNTER — Inpatient Hospital Stay (HOSPITAL_COMMUNITY)
Admission: AD | Admit: 2021-03-22 | Discharge: 2021-03-22 | Disposition: A | Discharge status: Home / Self Care | Payer: Medicaid Other | Attending: Obstetrics and Gynecology | Admitting: Obstetrics and Gynecology

## 2021-03-22 DIAGNOSIS — M7918 Myalgia, other site: Secondary | ICD-10-CM | POA: Diagnosis not present

## 2021-03-22 DIAGNOSIS — B3731 Acute candidiasis of vulva and vagina: Secondary | ICD-10-CM

## 2021-03-22 DIAGNOSIS — O26892 Other specified pregnancy related conditions, second trimester: Secondary | ICD-10-CM | POA: Diagnosis not present

## 2021-03-22 DIAGNOSIS — R109 Unspecified abdominal pain: Secondary | ICD-10-CM | POA: Insufficient documentation

## 2021-03-22 DIAGNOSIS — Z3A25 25 weeks gestation of pregnancy: Secondary | ICD-10-CM | POA: Diagnosis not present

## 2021-03-22 DIAGNOSIS — B373 Candidiasis of vulva and vagina: Secondary | ICD-10-CM | POA: Insufficient documentation

## 2021-03-22 DIAGNOSIS — O99891 Other specified diseases and conditions complicating pregnancy: Secondary | ICD-10-CM

## 2021-03-22 DIAGNOSIS — O98812 Other maternal infectious and parasitic diseases complicating pregnancy, second trimester: Secondary | ICD-10-CM | POA: Insufficient documentation

## 2021-03-22 LAB — CBC
HCT: 31.3 % — ABNORMAL LOW (ref 36.0–46.0)
Hemoglobin: 10.3 g/dL — ABNORMAL LOW (ref 12.0–15.0)
MCH: 28.8 pg (ref 26.0–34.0)
MCHC: 32.9 g/dL (ref 30.0–36.0)
MCV: 87.4 fL (ref 80.0–100.0)
Platelets: 252 10*3/uL (ref 150–400)
RBC: 3.58 MIL/uL — ABNORMAL LOW (ref 3.87–5.11)
RDW: 13 % (ref 11.5–15.5)
WBC: 6.6 10*3/uL (ref 4.0–10.5)
nRBC: 0 % (ref 0.0–0.2)

## 2021-03-22 LAB — URINALYSIS, ROUTINE W REFLEX MICROSCOPIC
Bacteria, UA: NONE SEEN
Bilirubin Urine: NEGATIVE
Glucose, UA: NEGATIVE mg/dL
Hgb urine dipstick: NEGATIVE
Ketones, ur: NEGATIVE mg/dL
Nitrite: NEGATIVE
Protein, ur: NEGATIVE mg/dL
Specific Gravity, Urine: 1.005 (ref 1.005–1.030)
pH: 7 (ref 5.0–8.0)

## 2021-03-22 LAB — WET PREP, GENITAL
Clue Cells Wet Prep HPF POC: NONE SEEN
Sperm: NONE SEEN
Trich, Wet Prep: NONE SEEN

## 2021-03-22 MED ORDER — TERCONAZOLE 0.4 % VA CREA: 1.0000 | TOPICAL_CREAM | Freq: Every day | VAGINAL | 0 refills | Status: AC

## 2021-03-22 MED ORDER — ACETAMINOPHEN 500 MG PO TABS
1000.0000 mg | ORAL_TABLET | Freq: Four times a day (QID) | ORAL | Status: DC | PRN
  Administered 2021-03-22: 1000 mg via ORAL
  Filled 2021-03-22: qty 2

## 2021-03-22 NOTE — MAU Provider Note (Addendum)
History     CSN: [id number]  Arrival date and time: 03/22/21 1342   Event Date/Time   First Provider Initiated Contact with Patient 03/22/21 1434      Chief Complaint  Patient presents with   Abdominal Pain   Vaginal Bleeding   35 y.o. [id number] $RemoveBefo'@25'[id number]$ .1 wks presenting with LAP. Pain started 2-3 days ago. Describes as sharp and intermittent. Pain radiates into vagina and thighs. Rates 10/10. Pain resolves when she is lying down and resumes when she is standing or walking. She took Tylenol yesterday, unsure if it helped. Reports spotting when she wiped earlier. No recent IC. Was given meds for yeast infection but only took it for 1 night. Denies urinary sx. Reports good FM.   OB History     Gravida  4   Para  3   Term  3   Preterm  0   AB  0   Living  3      SAB  0   IAB  0   Ectopic  0   Multiple  0   Live Births  3           Past Medical History:  Diagnosis Date   Bipolar 2 disorder ([place])    Headache(784.0)    Mood change 11/06/2019    Past Surgical History:  Procedure Laterality Date   WISDOM TOOTH EXTRACTION      Family History  Problem Relation Age of Onset   Bipolar disorder Mother    Diabetes Mother    Bipolar disorder Sister    Schizophrenia Sister     Social History   Tobacco Use   Smoking status: Never   Smokeless tobacco: Never  Vaping Use   Vaping Use: Never used  Substance Use Topics   Alcohol use: No   Drug use: No    Allergies: No Known Allergies  Medications Prior to Admission  Medication Sig Dispense Refill Last Dose   Blood Pressure KIT Use weekly 1 kit 0    cyclobenzaprine (FLEXERIL) 10 MG tablet TAKE 1 TABLET (10 MG TOTAL) BY MOUTH EVERY 8 (EIGHT) HOURS AS NEEDED (HEADACHE). 30 tablet 1    Doxylamine-Pyridoxine 10-10 MG TBEC Two tablets at bedtime on day 1 and 2; if symptoms persist, take 1 tablet in morning and 2 tablets at bedtime on day 3; if symptoms persist, may increase to 1 tablet in morning, 1 tablet  mid-afternoon, and 2 tablets at bedtime on day 4 60 tablet 1    famotidine (PEPCID) 20 MG tablet Take 1 tablet (20 mg total) by mouth 2 (two) times daily. 60 tablet 5    hydrOXYzine (ATARAX/VISTARIL) 25 MG tablet Take 1 tablet (25 mg total) by mouth every 6 (six) hours as needed for itching. 30 tablet 2    Misc. Devices (GOJJI WEIGHT SCALE) MISC To weigh at home weekly 1 each 0    Prenatal Vit-Fe Fumarate-FA ([facility] PRENATAL) 27-0.8 MG TABS TAKE 1 TABLET BY MOUTH EVERY DAY 30 tablet 1    tiZANidine (ZANAFLEX) 2 MG tablet Take 1 tablet (2 mg total) by mouth at bedtime. (Patient not taking: Reported on 03/10/2021) 30 tablet 0    triamcinolone ointment (KENALOG) 0.5 % Apply 1 application topically 2 (two) times daily. 30 g 0    zolpidem (AMBIEN) 5 MG tablet Take 1 tablet (5 mg total) by mouth at bedtime as needed for sleep. 30 tablet 1    zolpidem (AMBIEN) 5 MG tablet Take 1 tablet (5 mg total)  by mouth at bedtime as needed for sleep. (Patient not taking: No sig reported) 30 tablet 1    [DISCONTINUED] terconazole (TERAZOL 7) 0.4 % vaginal cream Place 1 applicator vaginally at bedtime. (Patient not taking: Reported on 03/10/2021) 45 g 0     Review of Systems  Constitutional:  Negative for fever.  Gastrointestinal:  Positive for abdominal pain. Negative for constipation, diarrhea, nausea and vomiting.  Genitourinary:  Positive for vaginal bleeding. Negative for dysuria, hematuria, urgency and vaginal discharge.  Musculoskeletal:  Negative for back pain.  Physical Exam   Blood pressure 103/73, pulse (!) 114, temperature 99.1 F (37.3 C), temperature source Oral, resp. rate 20, height $RemoveBe'5\' 7"'ZvCBOPNID$  (1.702 m), weight 81.8 kg, last menstrual period 12/21/2019, SpO2 98 %.  Physical Exam Vitals and nursing note reviewed. Exam conducted with a chaperone present.  Constitutional:      General: She is not in acute distress.    Appearance: Normal appearance.  HENT:     Head: Normocephalic and atraumatic.   Pulmonary:     Effort: Pulmonary effort is normal. No respiratory distress.  Abdominal:     Palpations: Abdomen is soft.     Tenderness: There is no abdominal tenderness.     Comments: gravid  Genitourinary:    Comments: External: no lesions or erythema Vagina: rugated, pink, dry, excoriated on left sidewall, scant pink/tan discharge Cervix closed/thick   Musculoskeletal:        General: Normal range of motion.     Cervical back: Normal range of motion.  Skin:    General: Skin is warm and dry.  Neurological:     General: No focal deficit present.     Mental Status: She is alert and oriented to person, place, and time.  Psychiatric:        Mood and Affect: Mood normal.        Behavior: Behavior normal.  EFM: 150 bpm, mod variability, no accels, rare variable decels EFM: 145 bpm, mod variability, no accels, rare variable decels Toco: none Bedside US to assist EFM placement: confirmed FHR x2 (run close w/i 4-6 beats)  Results for orders placed or performed during the hospital encounter of 03/22/21 (from the past 24 hour(s))  Wet prep, genital     Status: Abnormal   Collection Time: 03/22/21  2:58 PM  Result Value Ref Range   Yeast Wet Prep HPF POC PRESENT (A) NONE SEEN   Trich, Wet Prep NONE SEEN NONE SEEN   Clue Cells Wet Prep HPF POC NONE SEEN NONE SEEN   WBC, Wet Prep HPF POC MANY (A) NONE SEEN   Sperm NONE SEEN   CBC     Status: Abnormal   Collection Time: 03/22/21  3:29 PM  Result Value Ref Range   WBC 6.6 4.0 - 10.5 K/uL   RBC 3.58 (L) 3.87 - 5.11 MIL/uL   Hemoglobin 10.3 (L) 12.0 - 15.0 g/dL   HCT 31.3 (L) 36.0 - 46.0 %   MCV 87.4 80.0 - 100.0 fL   MCH 28.8 26.0 - 34.0 pg   MCHC 32.9 30.0 - 36.0 g/dL   RDW 13.0 11.5 - 15.5 %   Platelets 252 150 - 400 K/uL   nRBC 0.0 0.0 - 0.2 %  Urinalysis, Routine w reflex microscopic Urine, Clean Catch     Status: Abnormal   Collection Time: 03/22/21  3:49 PM  Result Value Ref Range   Color, Urine YELLOW YELLOW    APPearance CLEAR CLEAR   Specific Gravity,  Urine 1.005 1.005 - 1.030   pH 7.0 5.0 - 8.0   Glucose, UA NEGATIVE NEGATIVE mg/dL   Hgb urine dipstick NEGATIVE NEGATIVE   Bilirubin Urine NEGATIVE NEGATIVE   Ketones, ur NEGATIVE NEGATIVE mg/dL   Protein, ur NEGATIVE NEGATIVE mg/dL   Nitrite NEGATIVE NEGATIVE   Leukocytes,Ua SMALL (A) NEGATIVE   RBC / HPF 0-5 0 - 5 RBC/hpf   WBC, UA 0-5 0 - 5 WBC/hpf   Bacteria, UA NONE SEEN NONE SEEN   Squamous Epithelial / LPF 0-5 0 - 5   MAU Course  Procedures Tylenol  MDM Labs ordered and reviewed. Denies pain since arrival. No signs of acute abd process, PTL or UTI. Will treat yeast, informed for 7 nights. Pain likely MSK. Encouraged her to use maternity belt more often. Stable for discharge home.   Assessment and Plan   1. [redacted] weeks gestation of pregnancy   2. Yeast vaginitis   3. Musculoskeletal pain    Discharge home [facility] as scheduled PTL precautions Rx Terazol  Allergies as of 03/22/2021   No Known Allergies      Medication List     STOP taking these medications    tiZANidine 2 MG tablet Commonly known as: ZANAFLEX   zolpidem 5 MG tablet Commonly known as: AMBIEN       TAKE these medications    Blood Pressure Kit Use weekly   CVS Prenatal 27-0.8 MG Tabs TAKE 1 TABLET BY MOUTH EVERY DAY   cyclobenzaprine 10 MG tablet Commonly known as: FLEXERIL TAKE 1 TABLET (10 MG TOTAL) BY MOUTH EVERY 8 (EIGHT) HOURS AS NEEDED (HEADACHE).   Doxylamine-Pyridoxine 10-10 MG Tbec Two tablets at bedtime on day 1 and 2; if symptoms persist, take 1 tablet in morning and 2 tablets at bedtime on day 3; if symptoms persist, may increase to 1 tablet in morning, 1 tablet mid-afternoon, and 2 tablets at bedtime on day 4   famotidine 20 MG tablet Commonly known as: Pepcid Take 1 tablet (20 mg total) by mouth 2 (two) times daily.   Gojji Weight Scale Misc To weigh at home weekly   hydrOXYzine 25 MG tablet Commonly known as:  ATARAX/VISTARIL Take 1 tablet (25 mg total) by mouth every 6 (six) hours as needed for itching.   terconazole 0.4 % vaginal cream Commonly known as: TERAZOL 7 Place 1 applicator vaginally at bedtime for 7 doses.   triamcinolone ointment 0.5 % Commonly known as: KENALOG Apply 1 application topically 2 (two) times daily.        Julianne Handler, CNM 03/22/2021, 5:03 PM

## 2021-03-22 NOTE — MAU Note (Signed)
Sara Manning is a 35 y.o. at [redacted]w[redacted]d here in MAU reporting: intermittent sharp abdominal pain for the past couple of days. Today saw some light bleeding when she wiped. No LOF. + FM x 2.   Onset of complaint: ongoing  Pain score: 10/10  Vitals:   03/22/21 1428  BP: 103/73  Pulse: (!) 114  Resp: 20  Temp: 99.1 F (37.3 C)  SpO2: 98%     FHT:EFM applied in room  Lab orders placed from triage: UA

## 2021-03-23 ENCOUNTER — Ambulatory Visit (INDEPENDENT_AMBULATORY_CARE_PROVIDER_SITE_OTHER): Payer: Medicaid Other

## 2021-03-23 VITALS — BP 102/70 | HR 92 | Wt 181.7 lb

## 2021-03-23 DIAGNOSIS — O30042 Twin pregnancy, dichorionic/diamniotic, second trimester: Secondary | ICD-10-CM

## 2021-03-23 DIAGNOSIS — Z3A25 25 weeks gestation of pregnancy: Secondary | ICD-10-CM

## 2021-03-23 DIAGNOSIS — O0992 Supervision of high risk pregnancy, unspecified, second trimester: Secondary | ICD-10-CM

## 2021-03-23 LAB — GC/CHLAMYDIA PROBE AMP (~~LOC~~) NOT AT ARMC
Chlamydia: NEGATIVE
Comment: NEGATIVE
Comment: NORMAL
Neisseria Gonorrhea: NEGATIVE

## 2021-03-23 NOTE — Progress Notes (Signed)
   HIGH-RISK PREGNANCY OFFICE VISIT  Patient name: Sara Manning MRN [id number]  Date of birth: 18-Feb-1986 Chief Complaint:   Routine Prenatal Visit  Subjective:   Sara Manning is a 35 y.o. [phone] female at [redacted]w[redacted]d with an Estimated Date of Delivery: 07/04/21 being seen today for ongoing management of a high-risk pregnancy aeb has Gastroesophageal reflux disease; Bipolar disorder, current episode depressed, moderate (HCC); Eczema; Migraine with aura and with status migrainosus, not intractable; Headache; Positive pregnancy test; Supervision of high-risk pregnancy; and Twin pregnancy on their problem list.  Patient presents today with  lower abdominal pain .  She reports that it is increased with walking.  Patient endorses fetal movement. Patient denies abdominal cramping or contractions.  Patient denies vaginal concerns including abnormal discharge, leaking of fluid, and bleeding.  Contractions: Irritability. Vag. Bleeding: None.  Movement: Present.  Reviewed past medical,surgical, social, obstetrical and family history as well as problem list, medications and allergies.  Objective   Vitals:   03/23/21 1614  BP: 102/70  Pulse: 92  Weight: 181 lb 11.2 oz (82.4 kg)  Body mass index is 28.46 kg/m.  Total Weight Gain:9 lb 11.2 oz (4.4 kg)         Physical Examination:   General appearance: Well appearing, and in no distress  Mental status: Alert, oriented to person, place, and time  Skin: Warm & dry  Cardiovascular: Normal heart rate noted  Respiratory: Normal respiratory effort, no distress  Abdomen: Soft, gravid, nontender, AGA with Fundal Height: 30 cm  Pelvic: Cervical exam deferred           Extremities: Edema: None  Fetal Status: Fetal Heart Rate (bpm): 140/160  Movement: Present   No results found for this or any previous visit (from the past 24 hour(s)).  Assessment & Plan:  High-risk pregnancy of a 35 y.o., G4P3003 at [redacted]w[redacted]d with an Estimated Date of Delivery: 07/04/21    1. Supervision of high risk pregnancy in second trimester -Anticipatory guidance for upcoming appts. -Patient to schedule next appt in 2-3 weeks for an in-person visit. -Reviewed Glucola appt preparation including fasting the night before and morning of.   *Informed that it is okay to drink plain water throughout the night and prior to consumption of Glucola formula.  -Discussed anticipated office time of 2.5-3 hours.  -Reviewed blood draw procedures and labs which also include check of iron/HgB level, RPR, and HIV *Informed that repeat RPR/HIV are for pediatric records/compliance.  -Discussed how results of GTT are handled including diabetic education and BS testing for abnormal results and routine care for normal results.   2. Dichorionic diamniotic twin pregnancy in second trimester -FH at 30cm -Next growth [place] on July 22.  3. [redacted] weeks gestation of pregnancy -Doing well. -States no bleeding since visit to MAU. -Reassured that lower abdominal cramping is anticipated complaint considering twin gestation. -Encouraged maternity belt usage and tylenol when needed.      Meds: No orders of the defined types were placed in this encounter.  Labs/procedures today:  Lab Orders  No laboratory test(s) ordered today     Reviewed: Preterm labor symptoms and general obstetric precautions including but not limited to vaginal bleeding, contractions, leaking of fluid and fetal movement were reviewed in detail with the patient.  All questions were answered.  Follow-up: No follow-ups on file.  No orders of the defined types were placed in this encounter.  Cherre Robins MSN, CNM 03/23/2021

## 2021-03-23 NOTE — BH Specialist Note (Signed)
error 

## 2021-03-30 NOTE — Telephone Encounter (Signed)
Made appt for 7/26 for medication refills. Aquilla Solian, CMA

## 2021-04-05 ENCOUNTER — Ambulatory Visit: Payer: Medicaid Other | Admitting: Clinical

## 2021-04-05 DIAGNOSIS — Z5329 Procedure and treatment not carried out because of patient's decision for other reasons: Secondary | ICD-10-CM

## 2021-04-05 DIAGNOSIS — Z91199 Patient's noncompliance with other medical treatment and regimen due to unspecified reason: Secondary | ICD-10-CM

## 2021-04-05 NOTE — BH Specialist Note (Signed)
Pt did not arrive to video visit and did not answer the phone; Left HIPPA-compliant message to call back [name] from [employer] for [employer] at [facility] at  [phone] [facility] office).  ; left MyChart message for patient.   Pt returned call and rescheduled earlier visit (10:15am) to later in the day (3:45pm), due to unexpected events. At afternoon visit, Pt did not arrive to video visit and did not answer the phone ; Left HIPPA-compliant message to call back [name] from [employer] for [employer] at [facility] at  [phone] [facility] office).

## 2021-04-07 ENCOUNTER — Other Ambulatory Visit: Payer: Self-pay

## 2021-04-07 ENCOUNTER — Other Ambulatory Visit: Payer: Self-pay | Admitting: Obstetrics

## 2021-04-07 ENCOUNTER — Ambulatory Visit: Payer: Medicaid Other | Attending: Obstetrics and Gynecology

## 2021-04-07 ENCOUNTER — Ambulatory Visit: Payer: Medicaid Other | Admitting: *Deleted

## 2021-04-07 VITALS — BP 108/69 | HR 95

## 2021-04-07 DIAGNOSIS — O30042 Twin pregnancy, dichorionic/diamniotic, second trimester: Secondary | ICD-10-CM | POA: Diagnosis not present

## 2021-04-07 DIAGNOSIS — Z3A27 27 weeks gestation of pregnancy: Secondary | ICD-10-CM | POA: Diagnosis not present

## 2021-04-07 DIAGNOSIS — O99342 Other mental disorders complicating pregnancy, second trimester: Secondary | ICD-10-CM | POA: Diagnosis not present

## 2021-04-07 DIAGNOSIS — Z362 Encounter for other antenatal screening follow-up: Secondary | ICD-10-CM | POA: Diagnosis not present

## 2021-04-07 DIAGNOSIS — O30043 Twin pregnancy, dichorionic/diamniotic, third trimester: Secondary | ICD-10-CM
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
## 2021-04-11 ENCOUNTER — Ambulatory Visit: Payer: Medicaid Other | Admitting: Family Medicine

## 2021-04-11 ENCOUNTER — Ambulatory Visit (INDEPENDENT_AMBULATORY_CARE_PROVIDER_SITE_OTHER): Payer: Medicaid Other | Admitting: Clinical

## 2021-04-11 DIAGNOSIS — F3160 Bipolar disorder, current episode mixed, unspecified: Secondary | ICD-10-CM

## 2021-04-11 DIAGNOSIS — Z658 Other specified problems related to psychosocial circumstances: Secondary | ICD-10-CM | POA: Diagnosis not present
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
## 2021-04-12 NOTE — Patient Instructions (Signed)
[facility] [address] [place], [place] [address] [phone] (main office) [phone] (Lanell Dubie's office)       BRAINSTORMING  Develop a Plan Goals: Provide a way to start conversation about your new life with a baby Assist parents in recognizing and using resources within their reach Help pave the way before birth for an easier period of transition afterwards.  Make a list of the following information to keep in a central location: Full name of Mom and Partner: _____________________________________________ Baby's full name and Date of Birth: ___________________________________________ Home Address: ___________________________________________________________ ________________________________________________________________________ Home Phone: ____________________________________________________________ Parents' cell numbers: _____________________________________________________ ________________________________________________________________________ Name and contact info for OB: ______________________________________________ Name and contact info for Pediatrician:________________________________________ Contact info for Lactation Consultants: ________________________________________  REST and SLEEP *You each need at least 4-5 hours of uninterrupted sleep every day. Write specific names and contact information.* How are you going to rest in the postpartum period? While partner's home? When partner returns to work? When you both return to work? Where will your baby sleep? Who is available to help during the day? Evening? Night? Who could move in for a period to help support you? What are some ideas to help you get enough  sleep? __________________________________________________________________________________________________________________________________________________________________________________________________________________________________________ NUTRITIOUS FOOD AND DRINK *Plan for meals before your baby is born so you can have healthy food to eat during the immediate postpartum period.* Who will look after breakfast? Lunch? Dinner? List names and contact information. Brainstorm quick, healthy ideas for each meal. What can you do before baby is born to prepare meals for the postpartum period? How can others help you with meals? Which grocery stores provide online shopping and delivery? Which restaurants offer take-out or delivery options? ______________________________________________________________________________________________________________________________________________________________________________________________________________________________________________________________________________________________________________________________________________________________________________________________________  CARE FOR MOM *It's important that mom is cared for and pampered in the postpartum period. Remember, the most important ways new mothers need care are: sleep, nutrition, gentle exercise, and time off.* Who can come take care of mom during this period? Make a list of people with their contact information. List some activities that make you feel cared for, rested, and energized? Who can make sure you have opportunities to do these things? Does mom have a space of her very own within your home that's just for her? Make a "Mama Cave" where she can be comfortable, rest, and renew herself  daily. ______________________________________________________________________________________________________________________________________________________________________________________________________________________________________________________________________________________________________________________________________________________________________________________________________    CARE FOR AND FEEDING BABY *Knowledgeable and encouraging people will offer the best support with regard to feeding your baby.* Educate yourself and choose the best feeding option for your baby. Make a list of people who will guide, support, and be a resource for you as your care for and feed your baby. (Friends that have breastfed or are currently breastfeeding, lactation consultants, breastfeeding support groups, etc.) Consider a postpartum doula. (These websites can give you information: dona.org & padanc.org) Seek out local breastfeeding resources like the breastfeeding support group at [facility] or [employer]. ______________________________________________________________________________________________________________________________________________________________________________________________________________________________________________________________________________________________________________________________________________________________________________________________________  CHORES AND ERRANDS Who can help with a thorough cleaning before baby is born? Make a list of people who will help with housekeeping and chores, like laundry, light cleaning, dishes, bathrooms, etc. Who can run some errands for you? What can you do to make sure you are stocked with basic supplies before baby is born? Who is going to do the  shopping? ______________________________________________________________________________________________________________________________________________________________________________________________________________________________________________________________________________________________________________________________________________________________________________________________________     Family Adjustment *Nurture yourselves.it helps parents be more loving and allows for better bonding with their child.* What sorts of things do you and   doing together? Which activities help you to connect and strengthen your relationship? Make a list of those things. Make a list of people whom you trust to care for your baby so you can have some time together as a couple. What types of things help partner feel connected to Mom? Make a list. What needs will partner have in order to bond with baby? Other children? Who will care for them when you go into labor and while you are in the hospital? Think about what the needs of your older children might be. Who can help you meet those needs? In what ways are you helping them prepare for bringing baby home? List some specific strategies you have for family adjustment. _______________________________________________________________________________________________________________________________________________________________________________________________________________________________________________________________________________________________________________________________________________  SUPPORT *Someone who can empathize with experiences normalizes your problems and makes them more bearable.* Make a list of other friends, neighbors, and/or co-workers you know with infants (and small children, if applicable) with whom you can connect. Make a list of local or online support groups, mom groups, etc. in which you can be  involved. ______________________________________________________________________________________________________________________________________________________________________________________________________________________________________________________________________________________________________________________________________________________________________________________________________  Childcare Plans Investigate and plan for childcare if mom is returning to work. Talk about mom's concerns about her transition back to work. Talk about partner's concerns regarding this transition.  Mental Health *Your mental health is one of the highest priorities for a pregnant or postpartum mom.* 1 in 5 women experience anxiety and/or depression from the time of conception through the first year after birth. Postpartum Mood Disorders are the #1 complication of pregnancy and childbirth and the suffering experienced by these mothers is not necessary! These illnesses are temporary and respond well to treatment, which often includes self-care, social support, talk therapy, and medication when needed. Women experiencing anxiety and depression often say things like: "I'm supposed to be happy.why do I feel so sad?", "Why can't I snap out of it?", "I'm having thoughts that scare me." There is no need to be embarrassed if you are feeling these symptoms: Overwhelmed, anxious, angry, sad, guilty, irritable, hopeless, exhausted but can't sleep You are NOT alone. You are NOT to blame. With help, you WILL be well. Where can I find help? Medical professionals such as your OB, midwife, gynecologist, family practitioner, primary care provider, pediatrician, or mental health providers; [facility] support groups: Feelings After Birth, Breastfeeding Support Group, Baby and Me Group, and Fit 4 Two exercise classes. You have permission to ask for help. It will confirm your feelings, validate your experiences,  share/learn coping strategies, and gain support and encouragement as you heal. You are important! BRAINSTORM Make a list of local resources, including resources for mom and for partner. Identify support groups. Identify people to call late at night - include names and contact info. Talk with partner about perinatal mood and anxiety disorders. Talk with your OB, midwife, and doula about baby blues and about perinatal mood and anxiety disorders. Talk with your pediatrician about perinatal mood and anxiety disorders.   Support & Sanity Savers   What do you really need?  Basics In preparing for a new baby, many expectant parents spend hours shopping for baby clothes, decorating the nursery, and deciding which car seat to buy. Yet most don't think much about what the reality of parenting a newborn will be like, and what they need to make it through that. So, here is the advice of experienced parents. We know you'll read this, and think "they're exaggerating, I don't really need that." Just trust us on these, OK? Plan for all of   this, and if it turns out you don't need it, come back and teach us how you did it!  Must-Haves (Once baby's survival needs are met, make sure you attend to your own survival needs!) Sleep An average newborn sleeps 16-18 hours per day, over 6-7 sleep periods, rarely more than three hours at a time. It is normal and healthy for a newborn to wake throughout the night... but really hard on parents!! Naps. Prioritize sleep above any responsibilities like: cleaning house, visiting friends, running errands, etc.  Sleep whenever baby sleeps. If you can't nap, at least have restful times when baby eats. The more rest you get, the more patient you will be, the more emotionally stable, and better at solving problems.  Food You may not have realized it would be difficult to eat when you have a newborn. Yet, when we talk to countless new parents, they say things like "it may be 2:00 pm  when I realize I haven't had breakfast yet." Or "every time we sit down to dinner, baby needs to eat, and my food gets cold, so I don't bother to eat it." Finger food. Before your baby is born, stock up with one months' worth of food that: 1) you can eat with one hand while holding a baby, 2) doesn't need to be prepped, 3) is good hot or cold, 4) doesn't spoil when left out for a few hours, and 5) you like to eat. Think about: nuts, dried fruit, Clif bars, pretzels, jerky, gogurt, baby carrots, apples, bananas, crackers, cheez-n-crackers, string cheese, hot pockets or frozen burritos to microwave, garden burgers and breakfast pastries to put in the toaster, yogurt drinks, etc. Restaurant Menus. Make lists of your favorite restaurants & menu items. When family/friends want to help, you can give specific information without much thought. They can either bring you the food or send gift cards for just the right meals. Freezer Meals.  Take some time to make a few meals to put in the freezer ahead of time.  Easy to freeze meals can be anything such as soup, lasagna, chicken pie, or spaghetti sauce. Set up a Meal Schedule.  Ask friends and family to sign up to bring you meals during the first few weeks of being home. (It can be passed around at baby showers!) You have no idea how helpful this will be until you are in the throes of parenting.  www.takethemameal.com is a great website to check out. Emotional Support Know who to call when you're stressed out. Parenting a newborn is very challenging work. There are times when it totally overwhelms your normal coping abilities. EVERY NEW PARENT NEEDS TO HAVE A PLAN FOR WHO TO CALL WHEN THEY JUST CAN'T COPE ANY MORE. (And it has to be someone other than the baby's other parent!) Before your baby is born, come up with at least one person you can call for support - write their phone number down and post it on the refrigerator. Anxiety & Sadness. Baby blues are normal after  pregnancy; however, there are more severe types of anxiety & sadness which can occur and should not be ignored.  They are always treatable, but you have to take the first step by reaching out for help. [facility] offers a "Mom Talk" group which meets every Tuesday from 10 am - 11 am.  This group is for new moms who need support and connection after their babies are born.  Call [phone].  Really, Really Helpful (Plan for them!   Make sure these happen often!!) Physical Support with Taking Care of Yourselves Asking friends and family. Before your baby is born, set up a schedule of people who can come and visit and help out (or ask a friend to schedule for you). Any time someone says "let me know what I can do to help," sign them up for a day. When they get there, their job is not to take care of the baby (that's your job and your joy). Their job is to take care of you!  Postpartum doulas. If you don't have anyone you can call on for support, look into postpartum doulas:  professionals at helping parents with caring for baby, caring for themselves, getting breastfeeding started, and helping with household tasks. www.padanc.org is a helpful website for learning about doulas in our area. Peer Support / Parent Groups Why: One of the greatest ideas for new parents is to be around other new parents. Parent groups give you a chance to share and listen to others who are going through the same season of life, get a sense of what is normal infant development by watching several babies learn and grow, share your stories of triumph and struggles with empathetic ears, and forgive your own mistakes when you realize all parents are learning by trial and error. Where to find: There are many places you can meet other new parents throughout our community.  [facility] offers the following classes for new moms and their little ones:  Baby and Me (Birth to Crawling) and Breastfeeding Support Group. Go to  www.conehealthybaby.com or call [phone] for more information. Time for your Relationship It's easy to get so caught up in meeting baby's immediate needs that it's hard to find time to connect with your partner, and meet the needs of your relationship. It's also easy to forget what "quality time with your partner" actually looks like. If you take your baby on a date, you'd be amazed how much of your couple time is spent feeding the baby, diapering the baby, admiring the baby, and talking about the baby. Dating: Try to take time for just the two of you. Babysitter tip: Sometimes when moms are breastfeeding a newborn, they find it hard to figure out how to schedule outings around baby's unpredictable feeding schedules. Have the babysitter come for a three hour period. When she comes over, if baby has just eaten, you can leave right away, and come back in two hours. If baby hasn't fed recently, you start the date at home. Once baby gets hungry and gets a good feeding in, you can head out for the rest of your date time. Date Nights at Home: If you can't get out, at least set aside one evening a week to prioritize your relationship: whenever baby dozes off or doesn't have any immediate needs, spend a little time focusing on each other. Potential conflicts: The main relationship conflicts that come up for new parents are: issues related to sexuality, financial stresses, a feeling of an unfair division of household tasks, and conflicts in parenting styles. The more you can work on these issues before baby arrives, the better!  Fun and Frills (Don't forget these. and don't feel guilty for indulging in them!) Everyone has something in life that is a fun little treat that they do just for themselves. It may be: reading the morning paper, or going for a daily jog, or having coffee with a friend once a week, or going to a movie on Friday nights,   Friday nights, or fine chocolates, or bubble baths, or curling up with a good  book. Unless you do fun things for yourself every now and then, it's hard to have the energy for fun with your baby. Whatever your "special" treats are, make sure you find a way to continue to indulge in them after your baby is born. These special moments can recharge you, and allow you to return to baby with a new joy   PERINATAL MOOD DISORDERS: MATERNAL MENTAL HEALTH FROM CONCEPTION THROUGH THE POSTPARTUM PERIOD   Emergency and Crisis Resources:  If you are an imminent risk to self or others, are experiencing intense personal distress, and/or have noticed significant changes in activities of daily living, call:  [facility]: [phone] Mobile Crisis: 877-626-1772 National Suicide Hotline: 1-800-273-8255 Or visit the following crisis centers: Local Emergency Departments [facility]: [address], [place] [phone]. Hours: 8:30AM-5PM. Insurance Accepted: Medicaid, Medicare, and Uninsured.  [facility]  [address], [place] Mon-Friday 8am-3pm  [phone]                                                                                    Non-Crisis Resources: To identify specific providers that are covered by your insurance, contact your insurance company or local agencies: [facility]: 1-800-256-2452 [facility]: 888-581-9988 [employer]: 1-800-939-5911 Postpartum Support International- [place] 1-800-944-4773                                                      Outpatient therapy and medication management providers:  [facility] [phone] Hours: 9AM-5PM  Insurance Accepted: AARP, Aetna, BCBS, Cigna, [employer], Humana, Medicare  [facility] [phone] Hours: 8AM-5PM  nsurance Accepted: All insurances EXCEPT AARP, Aetna, [employer], and Humana [facility]: [phone]             Hours: 8AM-8PM Insurance  Accepted: Aetna, BCBS, Cigna, [employer], Medicaid, Medicare, Uninsured [facility]: [phone] [facility]: [phone] Hours: 8:30AM-7PM Insurance Accepted: Aetna, BCBS, Medicaid, Medicare, Tricare, United [employer] [facility]:  [phone]              Hours:9AM-5PM Insurance Accepted:  Aetna, BCBS, [facility], Medicaid, United Health Care  [facility] [phone] Hours: 9AM-5:30PM Insurance Accepted: AARP, Aetna, BCBS, Cigna, and Medicaid, Medicare, United Health Care [facility]:  [phone] Hours: 9am-5pm Insurance Accepted: BCBS; they do not accept Medicaid/Medicare [facility]: [phone] Hours: 9am-9pm Insurance Accepted: All major insurance including Medicaid and Medicare [facility]: [phone] Hours: 9AM-5:30PM Insurance Accepted: All insurances EXCEPT Medicaid and Medicare. [facility]: [phone]                                                                         Parenting Support Groups [facility]: [phone] [facility]:  [phone] Family Support Network (support for children in the NICU and/or with special needs), [phone]                                                                   Mental Health Support Groups Mental Health Association: [phone]                                                                                     Online Resources: Postpartum Support International: http://www.postpartum.net/  800-944-4PPD 2Moms Supporting Moms:  www.momssupportingmoms.net    

## 2021-04-13 ENCOUNTER — Other Ambulatory Visit: Payer: Self-pay | Admitting: *Deleted

## 2021-04-13 DIAGNOSIS — O0990 Supervision of high risk pregnancy, unspecified, unspecified trimester: Secondary | ICD-10-CM

## 2021-04-14 ENCOUNTER — Encounter: Payer: Medicaid Other | Admitting: Family Medicine

## 2021-04-14 ENCOUNTER — Other Ambulatory Visit: Payer: Medicaid Other

## 2021-04-18 ENCOUNTER — Encounter: Payer: Self-pay | Admitting: Family Medicine

## 2021-04-18 ENCOUNTER — Other Ambulatory Visit: Payer: Medicaid Other

## 2021-04-18 ENCOUNTER — Ambulatory Visit (INDEPENDENT_AMBULATORY_CARE_PROVIDER_SITE_OTHER): Payer: Medicaid Other | Admitting: Family Medicine

## 2021-04-18 ENCOUNTER — Other Ambulatory Visit: Payer: Self-pay

## 2021-04-18 VITALS — BP 123/75 | HR 97 | Wt 185.0 lb

## 2021-04-18 DIAGNOSIS — O0993 Supervision of high risk pregnancy, unspecified, third trimester: Secondary | ICD-10-CM

## 2021-04-18 DIAGNOSIS — O0990 Supervision of high risk pregnancy, unspecified, unspecified trimester: Secondary | ICD-10-CM | POA: Diagnosis not present

## 2021-04-18 DIAGNOSIS — Z23 Encounter for immunization: Secondary | ICD-10-CM

## 2021-04-18 DIAGNOSIS — O30043 Twin pregnancy, dichorionic/diamniotic, third trimester: Secondary | ICD-10-CM

## 2021-04-18 NOTE — Addendum Note (Signed)
Addended by: Faythe Casa on: 04/18/2021 09:51 AM   Modules accepted: Orders

## 2021-04-18 NOTE — Progress Notes (Signed)
   Subjective:  Sara Manning is a 35 y.o. [phone] at [redacted]w[redacted]d being seen today for ongoing prenatal care.  She is currently monitored for the following issues for this high-risk pregnancy and has Gastroesophageal reflux disease; Bipolar disorder, current episode depressed, moderate (HCC); Eczema; Migraine with aura and with status migrainosus, not intractable; Headache; Positive pregnancy test; Supervision of high-risk pregnancy; and Twin pregnancy on their problem list.  Patient reports  pressure and discomfort .  Contractions: Irritability. Vag. Bleeding: None.  Movement: Present. Denies leaking of fluid.   The following portions of the patient's history were reviewed and updated as appropriate: allergies, current medications, past family history, past medical history, past social history, past surgical history and problem list. Problem list updated.  Objective:   Vitals:   04/18/21 0907  BP: 123/75  Pulse: 97  Weight: 185 lb (83.9 kg)    Fetal Status: Fetal Heart Rate (bpm): 144/154   Movement: Present     General:  Alert, oriented and cooperative. Patient is in no acute distress.  Skin: Skin is warm and dry. No rash noted.   Cardiovascular: Normal heart rate noted  Respiratory: Normal respiratory effort, no problems with respiration noted  Abdomen: Soft, gravid, appropriate for gestational age. Pain/Pressure: Present     Pelvic: Vag. Bleeding: None     Cervical exam deferred        Extremities: Normal range of motion.  Edema: Trace  Mental Status: Normal mood and affect. Normal behavior. Normal judgment and thought content.   Urinalysis:      Assessment and Plan:  Pregnancy: G4P3003 at [redacted]w[redacted]d  1. Supervision of high risk pregnancy in third trimester BP and FHRs normal Discussed contraception, she is unsure and fearful of many methods. Recommended LARC vs BTL, she will consider 28wk labs and TDaP today  2. Dichorionic diamniotic twin pregnancy in third trimester Last growth  7/22, normal EFW for both twins with 4% discordance, normal AFI for both Following w MFM  Preterm labor symptoms and general obstetric precautions including but not limited to vaginal bleeding, contractions, leaking of fluid and fetal movement were reviewed in detail with the patient. Please refer to After Visit Summary for other counseling recommendations.  Return in 2 weeks (on 05/02/2021) for [facility], ob visit.   Venora Maples, MD

## 2021-04-18 NOTE — Patient Instructions (Signed)

## 2021-04-19 LAB — CBC
Hematocrit: 33.2 % — ABNORMAL LOW (ref 34.0–46.6)
Hemoglobin: 10.9 g/dL — ABNORMAL LOW (ref 11.1–15.9)
MCH: 28.4 pg (ref 26.6–33.0)
MCHC: 32.8 g/dL (ref 31.5–35.7)
MCV: 87 fL (ref 79–97)
Platelets: 223 10*3/uL (ref 150–450)
RBC: 3.84 x10E6/uL (ref 3.77–5.28)
RDW: 13.2 % (ref 11.7–15.4)
WBC: 5.5 10*3/uL (ref 3.4–10.8)

## 2021-04-19 LAB — GLUCOSE TOLERANCE, 2 HOURS W/ 1HR
Glucose, 1 hour: 142 mg/dL (ref 65–179)
Glucose, 2 hour: 150 mg/dL (ref 65–152)
Glucose, Fasting: 80 mg/dL (ref 65–91)

## 2021-04-19 LAB — RPR: RPR Ser Ql: NONREACTIVE

## 2021-04-19 LAB — HIV ANTIBODY (ROUTINE TESTING W REFLEX): HIV Screen 4th Generation wRfx: NONREACTIVE

## 2021-04-21 ENCOUNTER — Other Ambulatory Visit: Payer: Self-pay | Admitting: Family Medicine

## 2021-04-25 ENCOUNTER — Ambulatory Visit (INDEPENDENT_AMBULATORY_CARE_PROVIDER_SITE_OTHER): Payer: Medicaid Other | Admitting: Clinical

## 2021-04-25 DIAGNOSIS — F3160 Bipolar disorder, current episode mixed, unspecified: Secondary | ICD-10-CM | POA: Diagnosis not present

## 2021-04-25 DIAGNOSIS — Z658 Other specified problems related to psychosocial circumstances: Secondary | ICD-10-CM

## 2021-04-26 ENCOUNTER — Telehealth: Payer: Self-pay | Admitting: Clinical

## 2021-04-26 NOTE — Telephone Encounter (Signed)
Pt requesting letter that she cannot find on MyChart, to give to her employer that includes the following:   *Due date *Expected date of return to work postpartum *Confirmation that she is expecting twins

## 2021-04-28 ENCOUNTER — Ambulatory Visit (INDEPENDENT_AMBULATORY_CARE_PROVIDER_SITE_OTHER): Payer: Medicaid Other | Admitting: Family Medicine

## 2021-04-28 DIAGNOSIS — Z91199 Patient's noncompliance with other medical treatment and regimen due to unspecified reason: Secondary | ICD-10-CM

## 2021-04-28 DIAGNOSIS — Z5329 Procedure and treatment not carried out because of patient's decision for other reasons: Secondary | ICD-10-CM

## 2021-04-28 NOTE — Progress Notes (Signed)
Pt was late and not seen in clinic this afternoon. "No Charge"

## 2021-04-28 NOTE — Progress Notes (Signed)
Patient was too late to clinic visit and considered as a "no show". Currently getting prenatal care with Ob/Gyn and not our office as she is a high-risk ob patient with a twin pregnancy. Patient able to reschedule or call the clinic regarding any concerns not related to her pregnancy.  Jerami Tammen, DO

## 2021-05-03 ENCOUNTER — Other Ambulatory Visit: Payer: Self-pay

## 2021-05-03 ENCOUNTER — Encounter: Payer: Self-pay | Admitting: Family Medicine

## 2021-05-03 ENCOUNTER — Ambulatory Visit (INDEPENDENT_AMBULATORY_CARE_PROVIDER_SITE_OTHER): Payer: Medicaid Other | Admitting: Family Medicine

## 2021-05-03 VITALS — BP 109/79 | HR 86 | Wt 185.0 lb

## 2021-05-03 DIAGNOSIS — O30043 Twin pregnancy, dichorionic/diamniotic, third trimester: Secondary | ICD-10-CM

## 2021-05-03 DIAGNOSIS — Z3A31 31 weeks gestation of pregnancy: Secondary | ICD-10-CM

## 2021-05-03 DIAGNOSIS — O0993 Supervision of high risk pregnancy, unspecified, third trimester: Secondary | ICD-10-CM

## 2021-05-03 NOTE — Patient Instructions (Signed)

## 2021-05-03 NOTE — Progress Notes (Signed)
Patient complains of shortness of breath along with daily tightening of the belly

## 2021-05-03 NOTE — Progress Notes (Signed)
109/76 

## 2021-05-03 NOTE — Progress Notes (Signed)
   Subjective:  Sara Manning is a 35 y.o. [phone] at [redacted]w[redacted]d being seen today for ongoing prenatal care.  She is currently monitored for the following issues for this high-risk pregnancy and has Gastroesophageal reflux disease; Bipolar disorder, current episode depressed, moderate (HCC); Eczema; Migraine with aura and with status migrainosus, not intractable; Headache; Supervision of high-risk pregnancy; and Twin pregnancy on their problem list.  Patient reports no complaints.   .  .  Movement: Present. Denies leaking of fluid.   The following portions of the patient's history were reviewed and updated as appropriate: allergies, current medications, past family history, past medical history, past social history, past surgical history and problem list. Problem list updated.  Objective:   Vitals:   05/03/21 0939  BP: 109/79  Pulse: 86  Weight: 185 lb (83.9 kg)    Fetal Status: Fetal Heart Rate (bpm): 137/141   Movement: Present     General:  Alert, oriented and cooperative. Patient is in no acute distress.  Skin: Skin is warm and dry. No rash noted.   Cardiovascular: Normal heart rate noted  Respiratory: Normal respiratory effort, no problems with respiration noted  Abdomen: Soft, gravid, appropriate for gestational age. Pain/Pressure: Present     Pelvic:       Cervical exam deferred        Extremities: Normal range of motion.  Edema: Trace  Mental Status: Normal mood and affect. Normal behavior. Normal judgment and thought content.   Urinalysis:      Assessment and Plan:  Pregnancy: G4P3003 at [redacted]w[redacted]d  1. Supervision of high risk pregnancy in third trimester BP and FHRs normal Still undecided on contraception  2. Dichorionic diamniotic twin pregnancy in third trimester Following w MFM Normal [place] on last check, has follow up later this week  Preterm labor symptoms and general obstetric precautions including but not limited to vaginal bleeding, contractions, leaking of fluid and  fetal movement were reviewed in detail with the patient. Please refer to After Visit Summary for other counseling recommendations.  Return in 2 weeks (on 05/17/2021) for [facility], ob visit.   Venora Maples, MD

## 2021-05-05 ENCOUNTER — Ambulatory Visit: Payer: Medicaid Other | Admitting: *Deleted

## 2021-05-05 ENCOUNTER — Encounter: Payer: Self-pay | Admitting: *Deleted

## 2021-05-05 ENCOUNTER — Ambulatory Visit: Payer: Medicaid Other | Attending: Obstetrics and Gynecology

## 2021-05-05 ENCOUNTER — Other Ambulatory Visit: Payer: Self-pay

## 2021-05-05 VITALS — BP 123/69 | HR 87

## 2021-05-05 DIAGNOSIS — O30043 Twin pregnancy, dichorionic/diamniotic, third trimester: Secondary | ICD-10-CM

## 2021-05-05 DIAGNOSIS — Z362 Encounter for other antenatal screening follow-up: Secondary | ICD-10-CM | POA: Diagnosis not present

## 2021-05-05 DIAGNOSIS — Z3A31 31 weeks gestation of pregnancy: Secondary | ICD-10-CM | POA: Diagnosis not present

## 2021-05-05 DIAGNOSIS — O321XX2 Maternal care for breech presentation, fetus 2: Secondary | ICD-10-CM | POA: Diagnosis not present

## 2021-05-08 ENCOUNTER — Other Ambulatory Visit: Payer: Self-pay | Admitting: *Deleted

## 2021-05-08 DIAGNOSIS — O30043 Twin pregnancy, dichorionic/diamniotic, third trimester: Secondary | ICD-10-CM

## 2021-05-09 ENCOUNTER — Encounter: Payer: Medicaid Other | Attending: Student | Admitting: Physical Therapy

## 2021-05-16 ENCOUNTER — Encounter: Payer: Self-pay | Admitting: Physical Therapy

## 2021-05-17 ENCOUNTER — Other Ambulatory Visit: Payer: Self-pay | Admitting: Family Medicine

## 2021-05-17 ENCOUNTER — Telehealth: Payer: Self-pay

## 2021-05-17 DIAGNOSIS — Z3201 Encounter for pregnancy test, result positive: Secondary | ICD-10-CM

## 2021-05-17 NOTE — Telephone Encounter (Addendum)
Called pt in regards to her concern about swelling in her lower legs.  Pt reports that when she touches them and there is a indentation.  Pt informs me that her BP is 110/69 and 116/70.  Pt states that she elevates her feet and drinks plenty of water.  I informed pt that it is not abnormal to swollen feet as she is late in gestation and pregnant with twins. I advised pt to continue to do what she is doing and the provider will evaluate her at her visit tomorrow.  Pt verbalized understanding.    Addison Naegeli, RN  05/19/21

## 2021-05-18 ENCOUNTER — Other Ambulatory Visit: Payer: Self-pay

## 2021-05-18 ENCOUNTER — Ambulatory Visit (INDEPENDENT_AMBULATORY_CARE_PROVIDER_SITE_OTHER): Payer: Medicaid Other | Admitting: Family Medicine

## 2021-05-18 ENCOUNTER — Encounter: Payer: Self-pay | Admitting: Family Medicine

## 2021-05-18 VITALS — BP 115/71 | HR 75 | Wt 191.2 lb

## 2021-05-18 DIAGNOSIS — Z3A33 33 weeks gestation of pregnancy: Secondary | ICD-10-CM

## 2021-05-18 DIAGNOSIS — O1203 Gestational edema, third trimester: Secondary | ICD-10-CM

## 2021-05-18 DIAGNOSIS — O30043 Twin pregnancy, dichorionic/diamniotic, third trimester: Secondary | ICD-10-CM

## 2021-05-18 DIAGNOSIS — O0993 Supervision of high risk pregnancy, unspecified, third trimester: Secondary | ICD-10-CM

## 2021-05-18 NOTE — Patient Instructions (Signed)
Spider pregnancy tape   Check your blood pressure at home: if above 140>90, additionally with (or BP elevated alone) headache not improved with tylenol, blurred vision, worsening swelling, severe right upper abdominal pain, or progressing shortness of breath please be seen.   Also--if any leakage of fluid, vaginal bleeding, or persistent contractions go to MAU.

## 2021-05-18 NOTE — Telephone Encounter (Signed)
Patient concern addressed.    Sara Manning  05/18/21

## 2021-05-18 NOTE — Progress Notes (Signed)
    Subjective:  Sara Manning is a 35 y.o. [phone] at [redacted]w[redacted]d being seen today for ongoing prenatal care.  She is currently monitored for the following issues for this high-risk pregnancy and has Gastroesophageal reflux disease; Bipolar disorder, current episode depressed, moderate (HCC); Eczema; Migraine with aura and with status migrainosus, not intractable; Headache; Supervision of high-risk pregnancy; and Twin pregnancy on their problem list.  Patient reports  LE edema bilaterally and fatigue . BP has been normal at home. Denies any HA, blurred vision, RUQ abdominal pain, UE or facial swelling. Contractions: Irritability. Vag. Bleeding: None.  Movement: Present. Denies leaking of fluid.   The following portions of the patient's history were reviewed and updated as appropriate: allergies, current medications, past family history, past medical history, past social history, past surgical history and problem list.   Objective:   Vitals:   05/18/21 1055  BP: 115/71  Pulse: 75  Weight: 191 lb 3.2 oz (86.7 kg)    Fetal Status: Fetal Heart Rate (bpm): 134/140   Movement: Present     General:  Alert, oriented and cooperative. Patient is in no acute distress.  Skin: Skin is warm and dry. No rash noted.   Cardiovascular: Normal heart rate noted  Respiratory: Normal respiratory effort, no problems with respiration noted  Abdomen: Soft, gravid, appropriate for gestational age and twin gestation. Pain/Pressure: Present     Pelvic:  Cervical exam deferred        Extremities: Normal range of motion.  Edema: Mild pitting, slight indentation (1+ to mid/low shin that is equal bilaterally)   Mental Status: Normal mood and affect. Normal behavior. Normal judgment and thought content.    Assessment and Plan:  Pregnancy: G4P3003 at [redacted]w[redacted]d  1. Supervision of high risk pregnancy in third trimester Tired with increased pressure as abdomen increases, discussed belly band and tape. Good fetal movement.    2. Dichorionic diamniotic twin pregnancy in third trimester Minimal discordance (0-5%). Growth U/S with MFM EFW 55 and 71st percentiles on 8/18, next 9/19. Weekly testing @ 36 weeks.   3. Swelling of lower extremity during pregnancy, third trimester Bilateral and equal, without swelling elsewhere or additional s/sx. BP WNL, no concern for pre-eclampsia at this time. Cont monitoring BP at home. Aware of s/sx to be seen for prior to follow up. Elevate extremities, relative rest, and trial compression stockings.   4. [redacted] weeks gestation of pregnancy   Preterm labor symptoms and general obstetric precautions including but not limited to vaginal bleeding, contractions, leaking of fluid and fetal movement were reviewed in detail with the patient. Please refer to After Visit Summary for other counseling recommendations.   Return in about 2 weeks (around 06/01/2021) for [facility].   Allayne Stack, DO

## 2021-05-23 ENCOUNTER — Ambulatory Visit: Payer: Medicaid Other | Admitting: Family Medicine

## 2021-05-23 ENCOUNTER — Encounter: Payer: Self-pay | Admitting: Physical Therapy

## 2021-05-30 ENCOUNTER — Encounter: Payer: Self-pay | Admitting: Physical Therapy

## 2021-06-03 ENCOUNTER — Encounter (HOSPITAL_COMMUNITY): Payer: Self-pay | Admitting: Obstetrics & Gynecology

## 2021-06-03 ENCOUNTER — Other Ambulatory Visit: Payer: Self-pay

## 2021-06-03 ENCOUNTER — Inpatient Hospital Stay (HOSPITAL_COMMUNITY)
Admission: AD | Admit: 2021-06-03 | Discharge: 2021-06-04 | Disposition: A | Discharge status: Home / Self Care | Payer: Medicaid Other | Attending: Obstetrics & Gynecology | Admitting: Obstetrics & Gynecology

## 2021-06-03 DIAGNOSIS — Z3A35 35 weeks gestation of pregnancy: Secondary | ICD-10-CM

## 2021-06-03 DIAGNOSIS — O4703 False labor before 37 completed weeks of gestation, third trimester: Secondary | ICD-10-CM | POA: Diagnosis not present

## 2021-06-03 DIAGNOSIS — O30003 Twin pregnancy, unspecified number of placenta and unspecified number of amniotic sacs, third trimester: Secondary | ICD-10-CM

## 2021-06-03 DIAGNOSIS — O30043 Twin pregnancy, dichorionic/diamniotic, third trimester: Secondary | ICD-10-CM

## 2021-06-03 DIAGNOSIS — O4693 Antepartum hemorrhage, unspecified, third trimester: Secondary | ICD-10-CM | POA: Insufficient documentation

## 2021-06-03 DIAGNOSIS — Z3689 Encounter for other specified antenatal screening: Secondary | ICD-10-CM

## 2021-06-03 NOTE — MAU Note (Signed)
Vaginal bleeding when wiped at 5:30 pm, then happened again at 7 pm. No leaking.  Feet, ankle, swelling, hurts so bad.  Contractions for few days. Having twins.  Babies moving well.

## 2021-06-03 NOTE — MAU Provider Note (Signed)
Event Date/Time   First Provider Initiated Contact with Patient 06/03/21 2355       S: Ms. Sara Manning is a 35 y.o. [phone] at [redacted]w[redacted]d  who presents to MAU today complaining contractions q few minutes "for a couple of days." She endorses vaginal bleeding. She denies LOF. She reports normal fetal movement x 2.  She reports some facial acne that she noticed today. She states it burns.   O: BP 117/80 (BP Location: Right Arm)   Pulse (!) 103   Temp 97.9 F (36.6 C) (Oral)   Resp 16   Ht 5\' 7"  (1.702 m)   Wt 90.7 kg   LMP 12/21/2019   SpO2 97%   BMI 31.32 kg/m  GENERAL: Well-developed, well-nourished female in no acute distress.  HEAD: Normocephalic, atraumatic.  CHEST: Normal effort of breathing, regular heart rate ABDOMEN: Soft, nontender, gravid  Cervical exam:  Dilation: 2 Effacement (%): 50 Station: Ballotable Exam by:: [email or web address], CNM   Fetal Monitoring: Twin A Baseline: 135 Variability: Moderate Accelerations: Present Decelerations: None  Twin B Baseline: 140 Variability: Moderate Accelerations: Present Decelerations: None Contractions: Q2-52min   A: SIUP at [redacted]w[redacted]d  Twin Gestation Cat I FT x 2 Preterm Contractions  P: -Will monitor for 2 hours and reassess for cerical change.   [redacted]w[redacted]d, CNM 06/03/2021 11:56 PM   Reassessment (1:56 AM)  -No cervical change -Labor precautions. -Keep next appt as scheduled. -Return as needed. -Discharge to home in stable condition.   06/05/2021 MSN, CNM Advanced Practice Provider, [facility]

## 2021-06-04 DIAGNOSIS — O4693 Antepartum hemorrhage, unspecified, third trimester: Secondary | ICD-10-CM | POA: Diagnosis not present

## 2021-06-04 DIAGNOSIS — Z3A35 35 weeks gestation of pregnancy: Secondary | ICD-10-CM | POA: Diagnosis not present

## 2021-06-04 DIAGNOSIS — O4703 False labor before 37 completed weeks of gestation, third trimester: Secondary | ICD-10-CM | POA: Diagnosis present

## 2021-06-04 DIAGNOSIS — O30043 Twin pregnancy, dichorionic/diamniotic, third trimester: Secondary | ICD-10-CM | POA: Diagnosis not present

## 2021-06-05 ENCOUNTER — Other Ambulatory Visit: Payer: Self-pay

## 2021-06-05 ENCOUNTER — Encounter: Payer: Self-pay | Admitting: *Deleted

## 2021-06-05 ENCOUNTER — Other Ambulatory Visit: Payer: Self-pay | Admitting: *Deleted

## 2021-06-05 ENCOUNTER — Ambulatory Visit (HOSPITAL_BASED_OUTPATIENT_CLINIC_OR_DEPARTMENT_OTHER): Payer: Medicaid Other | Admitting: *Deleted

## 2021-06-05 ENCOUNTER — Ambulatory Visit: Payer: Medicaid Other | Attending: Obstetrics

## 2021-06-05 ENCOUNTER — Ambulatory Visit: Payer: Medicaid Other | Admitting: *Deleted

## 2021-06-05 VITALS — BP 132/81 | HR 82

## 2021-06-05 DIAGNOSIS — Z3A36 36 weeks gestation of pregnancy: Secondary | ICD-10-CM | POA: Diagnosis present

## 2021-06-05 DIAGNOSIS — Z3A35 35 weeks gestation of pregnancy: Secondary | ICD-10-CM

## 2021-06-05 DIAGNOSIS — O30043 Twin pregnancy, dichorionic/diamniotic, third trimester: Secondary | ICD-10-CM

## 2021-06-05 NOTE — Procedures (Signed)
WCBJSE Sara Manning 1986/05/16 [redacted]w[redacted]d   Fetus B Non-Stress Test Interpretation for 06/05/21  Indication:  di di twins  Fetal Heart Rate Fetus B Mode: External Baseline Rate (B): 135 BPM Variability: Moderate Accelerations: 15 x 15 Decelerations: None  Uterine Activity Mode: Palpation, Toco Contraction Frequency (min): 3 uc's with ui Contraction Duration (sec): 70-100 Contraction Quality: Mild Resting Tone Palpated: Relaxed Resting Time: Adequate  Interpretation (Baby B - Fetal Testing) Nonstress Test Interpretation (Baby B): Reactive Overall Impression (Baby B): Reassuring for gestational age Comments (Baby B): Dr. Parke Poisson reviewed tracing  Laureen Abrahams Sara Manning Oct 21, 1985 [redacted]w[redacted]d  Fetus A Non-Stress Test Interpretation for 06/05/21  Indication:  di di twins  Fetal Heart Rate A Mode: External Baseline Rate (A): 140 bpm Variability: Moderate Accelerations: 15 x 15 Decelerations: None Multiple birth?: Yes  Uterine Activity Mode: Palpation, Toco Contraction Frequency (min): 3 uc's with ui Contraction Duration (sec): 70-100 Contraction Quality: Mild Resting Tone Palpated: Relaxed Resting Time: Adequate  Interpretation (Fetal Testing) Nonstress Test Interpretation: Reactive Overall Impression: Reassuring for gestational age Comments: Dr. Parke Poisson reviewed tracing

## 2021-06-09 ENCOUNTER — Other Ambulatory Visit (HOSPITAL_COMMUNITY)
Admission: RE | Admit: 2021-06-09 | Discharge: 2021-06-09 | Disposition: A | Discharge status: Home / Self Care | Payer: Medicaid Other | Source: Ambulatory Visit | Attending: Certified Nurse Midwife | Admitting: Certified Nurse Midwife

## 2021-06-09 ENCOUNTER — Other Ambulatory Visit: Payer: Self-pay | Admitting: Family Medicine

## 2021-06-09 ENCOUNTER — Other Ambulatory Visit: Payer: Self-pay

## 2021-06-09 ENCOUNTER — Ambulatory Visit (INDEPENDENT_AMBULATORY_CARE_PROVIDER_SITE_OTHER): Payer: Medicaid Other | Admitting: Certified Nurse Midwife

## 2021-06-09 ENCOUNTER — Telehealth: Payer: Self-pay

## 2021-06-09 ENCOUNTER — Other Ambulatory Visit: Payer: Self-pay | Admitting: Advanced Practice Midwife

## 2021-06-09 VITALS — BP 125/85 | HR 93 | Wt 201.0 lb

## 2021-06-09 DIAGNOSIS — O2686 Pruritic urticarial papules and plaques of pregnancy (PUPPP): Secondary | ICD-10-CM

## 2021-06-09 DIAGNOSIS — O368132 Decreased fetal movements, third trimester, fetus 2: Secondary | ICD-10-CM

## 2021-06-09 DIAGNOSIS — O0993 Supervision of high risk pregnancy, unspecified, third trimester: Secondary | ICD-10-CM

## 2021-06-09 DIAGNOSIS — K219 Gastro-esophageal reflux disease without esophagitis: Secondary | ICD-10-CM

## 2021-06-09 DIAGNOSIS — Z3A36 36 weeks gestation of pregnancy: Secondary | ICD-10-CM

## 2021-06-09 DIAGNOSIS — O30043 Twin pregnancy, dichorionic/diamniotic, third trimester: Secondary | ICD-10-CM

## 2021-06-09 MED ORDER — PREDNISONE 5 MG PO TABS: 10.0000 mg | ORAL_TABLET | Freq: Every day | ORAL | 0 refills | Status: DC

## 2021-06-09 NOTE — Progress Notes (Signed)
PRENATAL VISIT NOTE  Subjective:  Sara Manning is a 35 y.o. [phone] at [redacted]w[redacted]d being seen today for ongoing prenatal care.  She is currently monitored for the following issues for this high-risk pregnancy and has Gastroesophageal reflux disease; Bipolar disorder, current episode depressed, moderate (HCC); Eczema; Migraine with aura and with status migrainosus, not intractable; Headache; Supervision of high-risk pregnancy; and Twin pregnancy on their problem list.  Patient reports  severe edema in lower extremities and belly. The swelling is affecting her mobility as she cannot bend her knees or ankles very well. Also reports a very itchy rash that began on her belly and has now spread to her entire body, none of the OTC creams have worked. Cannot tell if baby B is moving well, has not felt her move since she got up this morning. Expresses frustration at not getting recommendations for the rash at prior visits  .  Contractions: Irregular. Vag. Bleeding: None.  Movement: Present. Denies leaking of fluid.   The following portions of the patient's history were reviewed and updated as appropriate: allergies, current medications, past family history, past medical history, past social history, past surgical history and problem list.   Objective:   Vitals:   06/09/21 1030  BP: 125/85  Pulse: 93  Weight: 201 lb (91.2 kg)    Fetal Status: Fetal Heart Rate (bpm): 141/159   Movement: Present     General:  Alert, oriented and cooperative. Patient is in no acute distress.  Skin: Skin is warm and dry. Raised rash present on belly, thighs, arms, chest, neck and face. Areas of excoriation in all areas. Skin on belly is edematous, tight, very reactive to touch (got reddened and itchy in reaction to gel and Doppler).  Cardiovascular: Normal heart rate noted  Respiratory: Normal respiratory effort, no problems with respiration noted  Abdomen: Soft, gravid, appropriate for gestational age.  Pain/Pressure:  Present     Pelvic: Cervical exam deferred        Extremities: Normal range of motion.  Edema: Deep pitting, indentation remains for a short time and painful to assess  Mental Status: Normal mood and affect. Normal behavior. Normal judgment and thought content.   Assessment and Plan:  Pregnancy: G4P3003 at [redacted]w[redacted]d 1. Supervision of high risk pregnancy in third trimester - Not doing well, very uncomfortable, much of her skin is painful or stinging to the touch. - Feeling regular movement but cannot tell if decrease today is because she is distracted by other discomforts - Encouraged topical anti-itch creams, oatmeal baths for rash. Advised increased protein and water intake to help swelling; also demonstrated lymphatic massage to facilitate lymphatic drainage.  2. 36 weeks of gestation - GC/Chlamydia probe amp ([place])not at [facility] - Culture, beta strep (group b only)  3. Dichorionic diamniotic twin pregnancy in third trimester - Last U/S showed 6% discordance and recommended delivery at 37-38 weeks. Given overall discomfort, IOL scheduled for 37wks  4. Decreased fetal movements in third trimester, fetus 2 of multiple gestation - Fetal nonstress test - Both babies reactive  5. PUPPP (pruritic urticarial papules and plaques of pregnancy) - Prednisone sent to pharmacy, 10mg  burst daily x2 days then 5mg  daily until IOL  Preterm labor symptoms and general obstetric precautions including but not limited to vaginal bleeding, contractions, leaking of fluid and fetal movement were reviewed in detail with the patient. Please refer to After Visit Summary for other counseling recommendations.   No follow-ups on file.  Future Appointments  Date  Time Provider Department Center  06/12/2021 11:00 AM [facility] NURSE [facility]  06/12/2021 11:15 AM [facility] US2 [facility]  06/14/2021  7:00 AM [facility] SCHED ROOM [facility] None   Bernerd Limbo, CNM

## 2021-06-09 NOTE — Telephone Encounter (Signed)
Pt call transferred from the front office.  Pt reports that she has all over her body that started on her face a couple days ago.  Pt denies taking anything for the rash.  Pt states that she spoke with an on call nurse who stated that it could be her liver and that she needs to be seen immediately.  Reviewed chart with Edd Arbour, CNM.  Provider agreed to see pt in office.  Front office notified.   Addison Naegeli, RN  06/09/21

## 2021-06-09 NOTE — Progress Notes (Signed)
IOL requested for 06/13/21 at [facility]. IOL form faxed.  Fleet Contras RN  06/09/21

## 2021-06-09 NOTE — Addendum Note (Signed)
Addended by: Edd Arbour on: 06/09/2021 03:07 PM   Modules accepted: Orders, SmartSet

## 2021-06-12 ENCOUNTER — Ambulatory Visit: Payer: Medicaid Other | Admitting: *Deleted

## 2021-06-12 ENCOUNTER — Other Ambulatory Visit: Payer: Self-pay

## 2021-06-12 ENCOUNTER — Ambulatory Visit: Payer: Medicaid Other | Attending: Obstetrics

## 2021-06-12 VITALS — BP 119/76 | HR 71

## 2021-06-12 DIAGNOSIS — O30043 Twin pregnancy, dichorionic/diamniotic, third trimester: Secondary | ICD-10-CM | POA: Diagnosis not present

## 2021-06-12 DIAGNOSIS — Z3A36 36 weeks gestation of pregnancy: Secondary | ICD-10-CM

## 2021-06-12 DIAGNOSIS — O30049 Twin pregnancy, dichorionic/diamniotic, unspecified trimester: Secondary | ICD-10-CM | POA: Diagnosis present

## 2021-06-12 LAB — GC/CHLAMYDIA PROBE AMP (~~LOC~~) NOT AT ARMC
Chlamydia: NEGATIVE
Comment: NEGATIVE
Comment: NORMAL
Neisseria Gonorrhea: NEGATIVE

## 2021-06-12 LAB — CULTURE, BETA STREP (GROUP B ONLY): Strep Gp B Culture: POSITIVE — AB

## 2021-06-13 ENCOUNTER — Encounter: Payer: Medicaid Other | Admitting: Family Medicine

## 2021-06-14 ENCOUNTER — Other Ambulatory Visit: Payer: Self-pay

## 2021-06-14 ENCOUNTER — Inpatient Hospital Stay (HOSPITAL_COMMUNITY)
Admission: AD | Admit: 2021-06-14 | Discharge: 2021-06-16 | DRG: 806 | Disposition: A | Discharge status: Home / Self Care | Payer: Medicaid Other | Attending: Family Medicine | Admitting: Family Medicine

## 2021-06-14 ENCOUNTER — Inpatient Hospital Stay (HOSPITAL_COMMUNITY): Payer: Medicaid Other

## 2021-06-14 ENCOUNTER — Encounter (HOSPITAL_COMMUNITY): Payer: Self-pay | Admitting: Family Medicine

## 2021-06-14 DIAGNOSIS — O328XX2 Maternal care for other malpresentation of fetus, fetus 2: Secondary | ICD-10-CM | POA: Diagnosis not present

## 2021-06-14 DIAGNOSIS — Z3A37 37 weeks gestation of pregnancy: Secondary | ICD-10-CM

## 2021-06-14 DIAGNOSIS — K219 Gastro-esophageal reflux disease without esophagitis: Secondary | ICD-10-CM | POA: Diagnosis present

## 2021-06-14 DIAGNOSIS — O2686 Pruritic urticarial papules and plaques of pregnancy (PUPPP): Secondary | ICD-10-CM | POA: Diagnosis not present

## 2021-06-14 DIAGNOSIS — O321XX2 Maternal care for breech presentation, fetus 2: Secondary | ICD-10-CM | POA: Diagnosis not present

## 2021-06-14 DIAGNOSIS — Z20822 Contact with and (suspected) exposure to covid-19: Secondary | ICD-10-CM | POA: Diagnosis present

## 2021-06-14 DIAGNOSIS — O30009 Twin pregnancy, unspecified number of placenta and unspecified number of amniotic sacs, unspecified trimester: Secondary | ICD-10-CM | POA: Diagnosis present

## 2021-06-14 DIAGNOSIS — O30043 Twin pregnancy, dichorionic/diamniotic, third trimester: Secondary | ICD-10-CM | POA: Diagnosis not present

## 2021-06-14 DIAGNOSIS — O0990 Supervision of high risk pregnancy, unspecified, unspecified trimester: Secondary | ICD-10-CM

## 2021-06-14 DIAGNOSIS — O99824 Streptococcus B carrier state complicating childbirth: Secondary | ICD-10-CM | POA: Diagnosis present

## 2021-06-14 DIAGNOSIS — O1205 Gestational edema, complicating the puerperium: Secondary | ICD-10-CM | POA: Diagnosis not present

## 2021-06-14 DIAGNOSIS — F3132 Bipolar disorder, current episode depressed, moderate: Secondary | ICD-10-CM | POA: Diagnosis present

## 2021-06-14 DIAGNOSIS — R600 Localized edema: Secondary | ICD-10-CM | POA: Diagnosis not present

## 2021-06-14 DIAGNOSIS — O99820 Streptococcus B carrier state complicating pregnancy: Secondary | ICD-10-CM | POA: Diagnosis not present

## 2021-06-14 LAB — CBC
HCT: 36.6 % (ref 36.0–46.0)
Hemoglobin: 12 g/dL (ref 12.0–15.0)
MCH: 27.9 pg (ref 26.0–34.0)
MCHC: 32.8 g/dL (ref 30.0–36.0)
MCV: 85.1 fL (ref 80.0–100.0)
Platelets: 231 K/uL (ref 150–400)
RBC: 4.3 MIL/uL (ref 3.87–5.11)
RDW: 13.4 % (ref 11.5–15.5)
WBC: 4.1 K/uL (ref 4.0–10.5)
nRBC: 0.5 % — ABNORMAL HIGH (ref 0.0–0.2)

## 2021-06-14 LAB — RESP PANEL BY RT-PCR (FLU A&B, COVID) ARPGX2
Influenza A by PCR: NEGATIVE
Influenza B by PCR: NEGATIVE
SARS Coronavirus 2 by RT PCR: NEGATIVE

## 2021-06-14 LAB — SYPHILIS: RPR W/REFLEX TO RPR TITER AND TREPONEMAL ANTIBODIES, TRADITIONAL SCREENING AND DIAGNOSIS ALGORITHM: RPR Ser Ql: NONREACTIVE

## 2021-06-14 MED ORDER — DIBUCAINE (PERIANAL) 1 % EX OINT: 1.0000 "application " | TOPICAL_OINTMENT | CUTANEOUS | Status: DC | PRN

## 2021-06-14 MED ORDER — ZOLPIDEM TARTRATE 5 MG PO TABS: 5.0000 mg | ORAL_TABLET | Freq: Every evening | ORAL | Status: DC | PRN

## 2021-06-14 MED ORDER — SODIUM CHLORIDE 0.9% FLUSH
3.0000 mL | Freq: Two times a day (BID) | INTRAVENOUS | Status: DC
  Administered 2021-06-16: 3 mL via INTRAVENOUS

## 2021-06-14 MED ORDER — ONDANSETRON HCL 4 MG/2ML IJ SOLN: 4.0000 mg | Freq: Four times a day (QID) | INTRAMUSCULAR | Status: DC | PRN

## 2021-06-14 MED ORDER — MISOPROSTOL 50MCG HALF TABLET: 50.0000 ug | ORAL_TABLET | ORAL | Status: DC | PRN

## 2021-06-14 MED ORDER — METHYLERGONOVINE MALEATE 0.2 MG/ML IJ SOLN
INTRAMUSCULAR | Status: AC
  Administered 2021-06-14: 0.2 mg via INTRAMUSCULAR
  Filled 2021-06-14: qty 1

## 2021-06-14 MED ORDER — SOD CITRATE-CITRIC ACID 500-334 MG/5ML PO SOLN: 30.0000 mL | ORAL | Status: DC | PRN

## 2021-06-14 MED ORDER — KETOROLAC TROMETHAMINE 30 MG/ML IJ SOLN
30.0000 mg | Freq: Once | INTRAMUSCULAR | Status: AC
  Administered 2021-06-14: 30 mg via INTRAVENOUS
  Filled 2021-06-14: qty 1

## 2021-06-14 MED ORDER — TERBUTALINE SULFATE 1 MG/ML IJ SOLN: 0.2500 mg | Freq: Once | INTRAMUSCULAR | Status: DC | PRN

## 2021-06-14 MED ORDER — LACTATED RINGERS IV SOLN: INTRAVENOUS | Status: DC

## 2021-06-14 MED ORDER — OXYCODONE-ACETAMINOPHEN 5-325 MG PO TABS
1.0000 | ORAL_TABLET | ORAL | Status: DC | PRN
Start: 2021-06-14 — End: 2021-06-14

## 2021-06-14 MED ORDER — SODIUM CHLORIDE 0.9% FLUSH: 3.0000 mL | INTRAVENOUS | Status: DC | PRN

## 2021-06-14 MED ORDER — ONDANSETRON HCL 4 MG PO TABS: 4.0000 mg | ORAL_TABLET | ORAL | Status: DC | PRN

## 2021-06-14 MED ORDER — PENICILLIN G POT IN DEXTROSE 60000 UNIT/ML IV SOLN
3.0000 10*6.[IU] | INTRAVENOUS | Status: DC
  Administered 2021-06-14 (×2): 3 10*6.[IU] via INTRAVENOUS
  Filled 2021-06-14 (×4): qty 50

## 2021-06-14 MED ORDER — MEDROXYPROGESTERONE ACETATE 150 MG/ML IM SUSP: 150.0000 mg | INTRAMUSCULAR | Status: DC | PRN

## 2021-06-14 MED ORDER — ACETAMINOPHEN 325 MG PO TABS: 650.0000 mg | ORAL_TABLET | ORAL | Status: DC | PRN

## 2021-06-14 MED ORDER — METHYLERGONOVINE MALEATE 0.2 MG/ML IJ SOLN: 0.2000 mg | INTRAMUSCULAR | Status: DC | PRN

## 2021-06-14 MED ORDER — MEASLES, MUMPS & RUBELLA VAC IJ SOLR: 0.5000 mL | Freq: Once | INTRAMUSCULAR | Status: DC

## 2021-06-14 MED ORDER — COCONUT OIL OIL: 1.0000 "application " | TOPICAL_OIL | Status: DC | PRN

## 2021-06-14 MED ORDER — SODIUM CHLORIDE 0.9 % IV SOLN: 250.0000 mL | INTRAVENOUS | Status: DC | PRN

## 2021-06-14 MED ORDER — LACTATED RINGERS IV SOLN: 500.0000 mL | INTRAVENOUS | Status: DC | PRN

## 2021-06-14 MED ORDER — OXYTOCIN-SODIUM CHLORIDE 30-0.9 UT/500ML-% IV SOLN
2.5000 [IU]/h | INTRAVENOUS | Status: DC
  Filled 2021-06-14: qty 500

## 2021-06-14 MED ORDER — OXYTOCIN BOLUS FROM INFUSION
333.0000 mL | Freq: Once | INTRAVENOUS | Status: AC
  Administered 2021-06-14: 333 mL via INTRAVENOUS

## 2021-06-14 MED ORDER — TETANUS-DIPHTH-ACELL PERTUSSIS 5-2.5-18.5 LF-MCG/0.5 IM SUSY: 0.5000 mL | PREFILLED_SYRINGE | Freq: Once | INTRAMUSCULAR | Status: DC

## 2021-06-14 MED ORDER — ONDANSETRON HCL 4 MG/2ML IJ SOLN: 4.0000 mg | INTRAMUSCULAR | Status: DC | PRN

## 2021-06-14 MED ORDER — ACETAMINOPHEN 325 MG PO TABS
650.0000 mg | ORAL_TABLET | ORAL | Status: DC | PRN
  Administered 2021-06-14 – 2021-06-15 (×2): 650 mg via ORAL
  Filled 2021-06-14 (×2): qty 2

## 2021-06-14 MED ORDER — SODIUM CHLORIDE 0.9 % IV SOLN
5.0000 10*6.[IU] | Freq: Once | INTRAVENOUS | Status: AC
  Administered 2021-06-14: 5 10*6.[IU] via INTRAVENOUS
  Filled 2021-06-14: qty 5

## 2021-06-14 MED ORDER — SIMETHICONE 80 MG PO CHEW
80.0000 mg | CHEWABLE_TABLET | ORAL | Status: DC | PRN
  Administered 2021-06-16: 80 mg via ORAL
  Filled 2021-06-14: qty 1

## 2021-06-14 MED ORDER — METHYLERGONOVINE MALEATE 0.2 MG PO TABS: 0.2000 mg | ORAL_TABLET | ORAL | Status: DC | PRN

## 2021-06-14 MED ORDER — BENZOCAINE-MENTHOL 20-0.5 % EX AERO
1.0000 "application " | INHALATION_SPRAY | CUTANEOUS | Status: DC | PRN
  Administered 2021-06-14: 1 via TOPICAL
  Filled 2021-06-14: qty 56

## 2021-06-14 MED ORDER — IBUPROFEN 600 MG PO TABS
600.0000 mg | ORAL_TABLET | Freq: Four times a day (QID) | ORAL | Status: DC
  Administered 2021-06-15 – 2021-06-16 (×6): 600 mg via ORAL
  Filled 2021-06-14 (×7): qty 1

## 2021-06-14 MED ORDER — OXYCODONE HCL 5 MG PO TABS
5.0000 mg | ORAL_TABLET | Freq: Four times a day (QID) | ORAL | Status: DC | PRN
  Administered 2021-06-15: 5 mg via ORAL
  Filled 2021-06-14: qty 1

## 2021-06-14 MED ORDER — FENTANYL CITRATE (PF) 100 MCG/2ML IJ SOLN
100.0000 ug | INTRAMUSCULAR | Status: DC | PRN
  Administered 2021-06-14: 100 ug via INTRAVENOUS
  Filled 2021-06-14: qty 2

## 2021-06-14 MED ORDER — OXYTOCIN-SODIUM CHLORIDE 30-0.9 UT/500ML-% IV SOLN
1.0000 m[IU]/min | INTRAVENOUS | Status: DC
  Administered 2021-06-14: 2 m[IU]/min via INTRAVENOUS

## 2021-06-14 MED ORDER — SENNOSIDES-DOCUSATE SODIUM 8.6-50 MG PO TABS
2.0000 | ORAL_TABLET | ORAL | Status: DC
  Administered 2021-06-15 – 2021-06-16 (×2): 2 via ORAL
  Filled 2021-06-14 (×3): qty 2

## 2021-06-14 MED ORDER — OXYTOCIN 10 UNIT/ML IJ SOLN
INTRAMUSCULAR | Status: AC
  Filled 2021-06-14: qty 1

## 2021-06-14 MED ORDER — OXYCODONE-ACETAMINOPHEN 5-325 MG PO TABS: 2.0000 | ORAL_TABLET | ORAL | Status: DC | PRN

## 2021-06-14 MED ORDER — PRENATAL MULTIVITAMIN CH
1.0000 | ORAL_TABLET | Freq: Every day | ORAL | Status: DC
  Administered 2021-06-15 – 2021-06-16 (×2): 1 via ORAL
  Filled 2021-06-14 (×3): qty 1

## 2021-06-14 MED ORDER — WITCH HAZEL-GLYCERIN EX PADS: 1.0000 "application " | MEDICATED_PAD | CUTANEOUS | Status: DC | PRN

## 2021-06-14 MED ORDER — DIPHENHYDRAMINE HCL 25 MG PO CAPS
25.0000 mg | ORAL_CAPSULE | Freq: Four times a day (QID) | ORAL | Status: DC | PRN
  Administered 2021-06-15: 25 mg via ORAL
  Filled 2021-06-14: qty 1

## 2021-06-14 MED ORDER — LIDOCAINE HCL (PF) 1 % IJ SOLN
30.0000 mL | INTRAMUSCULAR | Status: AC | PRN
  Administered 2021-06-14: 30 mL via SUBCUTANEOUS
  Filled 2021-06-14: qty 30

## 2021-06-14 NOTE — Progress Notes (Signed)
LABOR PROGRESS NOTE  Sara Manning is a 35 y.o. [phone] at [redacted]w[redacted]d  admitted for IOL for di-di twin gestation with discordance.   Subjective: Extremely uncomfortable at this time. Feeling a lot of pressure.    Objective: BP 129/77   Pulse 76   Temp 98.7 F (37.1 C) (Oral)   Resp 18   Ht 5\' 7"  (1.702 m)   Wt 93.5 kg   LMP 12/21/2019   SpO2 100%   BMI 32.30 kg/m  or  Vitals:   06/14/21 1343 06/14/21 1625 06/14/21 1746 06/14/21 1920  BP: 129/72 130/75 120/77 129/77  Pulse: 70 80 78 76  Resp: 20 20 18 18   Temp: 98.4 F (36.9 C) 98.4 F (36.9 C)  98.7 F (37.1 C)  TempSrc: Oral Oral  Oral  SpO2:  100%    Weight:      Height:         Dilation: 6 Effacement (%): 80 Station: -1 Presentation: Vertex Exam by:: Dr. FHT A : baseline rate 140, moderate varibility, +acel, occasional variable decel FHT B : baseline rate 140, moderate varibility, +acel, no decel Toco: Q1-2 min  Labs: Lab Results  Component Value Date   WBC 4.1 06/14/2021   HGB 12.0 06/14/2021   HCT 36.6 06/14/2021   MCV 85.1 06/14/2021   PLT 231 06/14/2021    Patient Active Problem List   Diagnosis Date Noted   Indication for care in labor or delivery 06/14/2021   Supervision of high-risk pregnancy 11/23/2020   Twin pregnancy 11/23/2020   Headache 04/25/2020   Migraine with aura and with status migrainosus, not intractable 11/06/2019   Eczema 12/01/2015   Bipolar disorder, current episode depressed, moderate (HCC) 06/22/2011   Gastroesophageal reflux disease 04/30/2011    Assessment / Plan: 35 y.o. G4P3003 at [redacted]w[redacted]d here for IOL for di-di twins with fetal discordance.   #Labor: Contracting every 1-2  minutes, 5.5 cm dilated. AROM @ 1600. Currently on pitocin. Continuing to titrate pit as able.  #Pain:  Would like to avoid epidural; using nitrous as needed and getting IV pain meds now  #FWB: Cat 1 for both fetuses, reassuring  #ID:      GBS +, PCN given #MOF: Breast and formula  #MOC:  Unsure   20, MD  PGY-3, [facility]  06/14/2021, 7:42 PM

## 2021-06-14 NOTE — Progress Notes (Signed)
OB PN:  At bedside to review management.  Discussed plan for vaginal delivery- reviewed vertex/breech deliveries.  Reviewed that should Twin B's presentation be fetal arm, if unable to transition fetal position or reduce fetal arm, pt will require primary C-section.  Also reviewed pain management options including epidural- reviewed risk/benefits.  She declines epidural as she is concerned about potential back complications.  Questions and concerns were addressed and desires to proceed with vaginal delivery at this time.  Myna Hidalgo, DO Attending Obstetrician & Gynecologist, [facility] for [employer], [facility]

## 2021-06-14 NOTE — H&P (Addendum)
OBSTETRIC ADMISSION HISTORY AND PHYSICAL  Sara Manning is a 35 y.o. female [phone] with IUP at 68w1dby early u/s presenting for IOL for di/di twins. She reports +FMs, no LOF, no VB, no blurry vision, headaches , or RUQ pain. Admits to considerable LE edema throughout pregnancy. She plans on breast and formula feeding. She is unsure about what she wants to do for birth control.  [facility]  Dating: By 7 wk u/s --->  Estimated Date of Delivery: 07/04/21  Sono:   _0  Fetus A - CWD, normal anatomy, cephalic presentation, posterior lie, 3115g, 83% EFW Fetus B - CWD, normal anatomy, cephalic presentation, anterior lie, 2940g, 67% EFW  Prenatal History/Complications:  Pruritic urticarial papules and plaques of pregnancy   Past Medical History: Past Medical History:  Diagnosis Date   Bipolar 2 disorder ([place]    Headache(784.0)    Mood change 11/06/2019    Past Surgical History: Past Surgical History:  Procedure Laterality Date   WISDOM TOOTH EXTRACTION      Obstetrical History: OB History     Gravida  4   Para  3   Term  3   Preterm  0   AB  0   Living  3      SAB  0   IAB  0   Ectopic  0   Multiple  0   Live Births  3           Social History Social History   Socioeconomic History   Marital status: Single    Spouse name: Not on file   Number of children: Not on file   Years of education: Not on file   Highest education level: Not on file  Occupational History   Not on file  Tobacco Use   Smoking status: Never   Smokeless tobacco: Never  Vaping Use   Vaping Use: Never used  Substance and Sexual Activity   Alcohol use: No   Drug use: No   Sexual activity: Yes    Birth control/protection: None  Other Topics Concern   Not on file  Social History Narrative   Not on file   Social Determinants of Health   Financial Resource Strain: Not on file  Food Insecurity: No Food Insecurity   Worried About  Running Out of Food in the Last Year: Never true   Ran Out of Food in the Last Year: Never true  Transportation Needs: No Transportation Needs   Lack of Transportation (Medical): No   Lack of Transportation (Non-Medical): No  Physical Activity: Not on file  Stress: Not on file  Social Connections: Not on file    Family History: Family History  Problem Relation Age of Onset   Bipolar disorder Mother    Diabetes Mother    Bipolar disorder Sister    Schizophrenia Sister     Allergies: No Known Allergies  Medications Prior to Admission  Medication Sig Dispense Refill Last Dose   predniSONE (DELTASONE) 5 MG tablet Take 2 tablets (10 mg total) by mouth daily with breakfast. Take 2 tablets with food on day 1, then two with breakfast one day 2, then one tablet daily with food until delivery. 6 tablet 0 06/13/2021   Prenatal Vit-Fe Fumarate-FA ([employer] PRENATAL) 27-0.8 MG TABS TAKE 1 TABLET BY MOUTH EVERY DAY 30 tablet 1 06/13/2021   Blood Pressure KIT Use weekly 1 kit 0    famotidine (PEPCID) 20 MG tablet Take 1  tablet (20 mg total) by mouth 2 (two) times daily. (Patient not taking: No sig reported) 60 tablet 5      Review of Systems  All systems reviewed and negative except as stated in HPI  Blood pressure 119/76, pulse 70, temperature 98.9 F (37.2 C), temperature source Oral, resp. rate 18, height _0  (1.702 m), weight 93.5 kg, last menstrual period 12/21/2019.  General appearance: alert, cooperative, and mild distress with contractions Lungs: normal work of breathing on room air Heart: regular rate, warm and well perfused Abdomen: soft, non-tender, gravid Extremities: Homans sign is negative, no sign of DVT Presentation: cephalic for fetus A and breech for fetus B by bedside US   Fetal monitoring: Fetus A - 130 bpm, moderate variability, + accelerations with no decelerations  Fetus B - 130 bpm, moderate variability, + accelerations with no decelerations  Uterine activity: Every  4-6 minutes Dilation: 3.5 Effacement (%): 60 Station: -2 Exam by:: Ignacia Felling, RNC   Prenatal labs: ABO, Rh: --/--/O POS (09/28 8144) Antibody: NEG (09/28 0748) Rubella: 3.51 (02/28 1148) RPR: Non Reactive (08/02 0831)  HBsAg: Negative (02/28 1148)  HIV: Non Reactive (08/02 0831)  GBS: Positive/-- (09/23 1120)  2 hr Glucola normal Genetic screening normal  Anatomy US normal   Prenatal Transfer Tool  Maternal Diabetes: No Genetic Screening: Normal Maternal Ultrasounds/Referrals: Other:Di-Di twins  Fetal Ultrasounds or other Referrals:  Referred to Materal Fetal Medicine  Maternal Substance Abuse:  No Significant Maternal Medications:  None Significant Maternal Lab Results: Group B Strep positive  Results for orders placed or performed during the hospital encounter of 06/14/21 (from the past 24 hour(s))  CBC   Collection Time: 06/14/21  7:48 AM  Result Value Ref Range   WBC 4.1 4.0 - 10.5 K/uL   RBC 4.30 3.87 - 5.11 MIL/uL   Hemoglobin 12.0 12.0 - 15.0 g/dL   HCT 36.6 36.0 - 46.0 %   MCV 85.1 80.0 - 100.0 fL   MCH 27.9 26.0 - 34.0 pg   MCHC 32.8 30.0 - 36.0 g/dL   RDW 13.4 11.5 - 15.5 %   Platelets 231 150 - 400 K/uL   nRBC 0.5 (H) 0.0 - 0.2 %  Type and screen   Collection Time: 06/14/21  7:48 AM  Result Value Ref Range   ABO/RH(D) O POS    Antibody Screen NEG    Sample Expiration      06/17/2021,2359 Performed at [facility], [place] [address], [place], [place] [address]   Resp Panel by RT-PCR (Flu A&B, Covid) Nasopharyngeal Swab   Collection Time: 06/14/21  7:58 AM   Specimen: Nasopharyngeal Swab; Nasopharyngeal(NP) swabs in vial transport medium  Result Value Ref Range   SARS Coronavirus 2 by RT PCR NEGATIVE NEGATIVE   Influenza A by PCR NEGATIVE NEGATIVE   Influenza B by PCR NEGATIVE NEGATIVE    Patient Active Problem List   Diagnosis Date Noted   Indication for care in labor or delivery 06/14/2021   Supervision of high-risk pregnancy 11/23/2020    Twin pregnancy 11/23/2020   Headache 04/25/2020   Migraine with aura and with status migrainosus, not intractable 11/06/2019   Eczema 12/01/2015   Bipolar disorder, current episode depressed, moderate ([place]) 06/22/2011   Gastroesophageal reflux disease 04/30/2011    Assessment/Plan:  Sara Manning is a 35 y.o. G4P3003 at 51w1dhere for IOL for di-di twin gestation with 6% fetal discordance at last ultrasound.    Discussed with patient at bedside options  for delivery with di-di twins. Bedside US with Twin A cephalic and Twin B breech at this time.   Discussed that it is safe to proceed with vaginal delivery for Twin A. Reviewed breech extraction risks with patient for delivery of Twin B if baby does not turn. Advised about risks of fetal head entrapment and potential need to incise her cervix, use forceps, or proceed with C-section if prior maneuvers do not allow for breech extraction delivery.   Discussed that at any time, if fetal intolerance is encountered, we may need to proceed with cesarean delivery.   The risks of surgery were discussed with the patient including but were not limited to: bleeding which may require transfusion or reoperation; infection which may require antibiotics; injury to bowel, bladder, ureters or other surrounding organs; injury to the fetus; need for additional procedures including hysterectomy in the event of a life-threatening hemorrhage; formation of adhesions; placental abnormalities with subsequent pregnancies; incisional problems; thromboembolic phenomenon and other postoperative/anesthesia complications.   The patient voiced understanding of all of the above risks. Patient would like to proceed with labor and trial for vaginal delivery +/- breech extraction as necessary. All questions and concerns addressed.  #Labor: Contracting every 4-6 minutes, 3.5 cm dilated, will initiate pitocin and reassess in 3-4 hours. Consider AROM once patient receives adequate GBS  prophylaxis.  #Pain: Would like to avoid epidural; may consider IV pain meds or nitrous as needed #FWB: Cat 1 for both fetuses, reassuring  #ID:  GBS +, PCN ordered #MOF: Breast and formula  #MOC: Unsure   GME ATTESTATION:  I saw and evaluated the patient. I agree with the findings and the plan of care as documented in the resident's note. I have made changes to documentation as necessary and was present for counseling above with Dr. Ernestina Patches.  Vilma Meckel, MD OB Fellow, [place] [facility] [place] 06/14/2021 10:03 AM

## 2021-06-14 NOTE — Progress Notes (Signed)
Labor Progress Note Sara Manning is a 35 y.o. [phone] at [redacted]w[redacted]d presented for IOL for [facility] twins.   S: Feeling ctx and wants AROM.   O:  BP 129/72   Pulse 70   Temp 98.4 F (36.9 C) (Oral)   Resp 20   Ht 5\' 7"  (1.702 m)   Wt 93.5 kg   LMP 12/21/2019   BMI 32.30 kg/m  EFM: 135/mod/+accels, no decels 136/mod/+accels, no decels  CVE: Dilation: 4.5 Effacement (%): 70 Station: -1 Presentation: Vertex Exam by:: Dr. [email or web address]  AROM with copious clear fluid  A&P: 34 y.o. 20 [redacted]w[redacted]d here for IOL did di #Labor: Progressing well. AROM completed. Continue pitocin #Pain: prn IV meds or nitrous #FWB: Cat 1 x2  #GBS positive, PCN x 4 hours  [redacted]w[redacted]d, MD

## 2021-06-14 NOTE — Progress Notes (Signed)
LABOR PROGRESS NOTE  Sara Manning is a 35 y.o. [phone] at [redacted]w[redacted]d  admitted for IOL for di-di twin gestation with discordance.   Subjective: Sitting in bed uncomfortable. Would like [place] to break her water when able.   Objective: BP 119/81   Pulse 76   Temp 98.2 F (36.8 C) (Oral)   Resp 18   Ht 5\' 7"  (1.702 m)   Wt 93.5 kg   LMP 12/21/2019   BMI 32.30 kg/m  or  Vitals:   06/14/21 0740 06/14/21 0900 06/14/21 1040 06/14/21 1202  BP: 119/76 119/76 127/72 119/81  Pulse: 79 70 68 76  Resp: 20 18 18 18   Temp: 98.9 F (37.2 C)   98.2 F (36.8 C)  TempSrc: Oral   Oral  Weight: 93.5 kg     Height: 5\' 7"  (1.702 m)        Dilation: 4.5 Effacement (%): 70 Station: -1 Presentation: Vertex Exam by:: Dr. FHT A : baseline rate 135, moderate varibility, +acel, no decel FHT B : baseline rate 135, moderate varibility, +acel, no decel Toco: Q1-2 min  Labs: Lab Results  Component Value Date   WBC 4.1 06/14/2021   HGB 12.0 06/14/2021   HCT 36.6 06/14/2021   MCV 85.1 06/14/2021   PLT 231 06/14/2021    Patient Active Problem List   Diagnosis Date Noted   Indication for care in labor or delivery 06/14/2021   Supervision of high-risk pregnancy 11/23/2020   Twin pregnancy 11/23/2020   Headache 04/25/2020   Migraine with aura and with status migrainosus, not intractable 11/06/2019   Eczema 12/01/2015   Bipolar disorder, current episode depressed, moderate ([facility]) 06/22/2011   Gastroesophageal reflux disease 04/30/2011    Assessment / Plan: 35 y.o. G4P3003 at 106w1d here for IOL for di-di twins with fetal discordance.   #Labor: Contracting every 1-2  minutes, 4.5 cm dilated, Continue pitocin and consider AROM at next check.  #Pain:  Would like to avoid epidural; may consider IV pain meds or nitrous as needed #FWB: Cat 1 for both fetuses, reassuring  #ID:      GBS +, PCN given #MOF: Breast and formula  #MOC: Unsure   05/02/2011, MD  PGY-2, [facility]   06/14/2021, 1:42 PM

## 2021-06-14 NOTE — Lactation Note (Signed)
This note was copied from a baby's chart. Lactation Consultation Note  Patient Name: Sara Manning VJKQA'S Date: 06/14/2021 Reason for consult: L&D Initial assessment;Mother's request;Difficult latch;Early term 37-38.6wks;Multiple gestation;Breastfeeding assistance Age: <1 hr  LC assisted latching infant A and B. Infant B latched first in football for 10 min. Infant A latched second in prone position in cross cradle and still feeding at end of LC visit.   LC reviewed feeding cues for ETI. Mom some uterine cramping prior to latching, easier for her to hold and feed one infant at a time. Mom given warm compress states helped to alleviate some of uterine cramping.   We discussed post pumping and offering her EBM after latching and setting Mother up on DEBP once in the room.   Mom to receive further LC support on the floor.    Maternal Data Has patient been taught Hand Expression?: Yes Does the patient have breastfeeding experience prior to this delivery?: Yes How long did the patient breastfeed?: not able to get infant to latch  Feeding Mother's Current Feeding Choice: Breast Milk  LATCH Score Latch: Repeated attempts needed to sustain latch, nipple held in mouth throughout feeding, stimulation needed to elicit sucking reflex.  Audible Swallowing: Spontaneous and intermittent  Type of Nipple: Everted at rest and after stimulation  Comfort (Breast/Nipple): Filling, red/small blisters or bruises, mild/mod discomfort  Hold (Positioning): Assistance needed to correctly position infant at breast and maintain latch.  LATCH Score: 7   Lactation Tools Discussed/Used    Interventions Interventions: Assisted with latch;Breast feeding basics reviewed;Skin to skin;Breast massage;Hand express;Breast compression;Adjust position;Support pillows;Expressed milk;Education;Position options  Discharge    Consult Status Consult Status: Follow-up from L&D Date: 06/15/21 Follow-up type:  In-patient    Sara Manning  [name] 06/14/2021, 9:02 PM

## 2021-06-14 NOTE — Progress Notes (Signed)
Breech Exaction/CS consent: Sara Manning is 35 y.o. [phone] with 3 prior vaginal births her for IOL 2/2 to [facility] twins with vertex/breech presentation, confirmed by [place] today on LD.   We discussed the process of IOL in general. We reviewed the risks associated with breech extraction, most importantly fetal head entrapment. We discussed how head entrapment is alleviated with typical maneuvers, provider performed cervical laceration, and pipers forceps. We discussed the risks associated with head entrapment and the rare for present need for emergent CS if fetal head was unable to be delivered and the fetal consequence associated with extended head entrapment.   Additionally we reviewed that Twin B might convert to vertex.   We reviewed that if Twin A or Twin B showed signs of distress with remote delivery we might need to proceed to emergent CS. The risks of cesarean section were discussed with the patient including but were not limited to: bleeding which may require transfusion or reoperation; infection which may require antibiotics; injury to bowel, bladder, ureters or other surrounding organs; injury to the fetus; need for additional procedures including hysterectomy in the event of a life-threatening hemorrhage; placental abnormalities wth subsequent pregnancies, incisional problems, thromboembolic phenomenon and other postoperative/anesthesia complications.    Patient agreed to vaginal delivery with potential breech extraction and pLTCS if necessary.

## 2021-06-15 ENCOUNTER — Inpatient Hospital Stay (HOSPITAL_COMMUNITY): Payer: Medicaid Other

## 2021-06-15 DIAGNOSIS — R600 Localized edema: Secondary | ICD-10-CM

## 2021-06-15 LAB — DIC (DISSEMINATED INTRAVASCULAR COAGULATION)PANEL
D-Dimer, Quant: >20 ug/mL-FEU — ABNORMAL HIGH (ref 0.00–0.50)
Fibrinogen: 254 mg/dL (ref 210–475)
INR: 1.2 (ref 0.8–1.2)
Platelets: 217 10*3/uL (ref 150–400)
Prothrombin Time: 14.9 seconds (ref 11.4–15.2)
aPTT: 32 seconds (ref 24–36)

## 2021-06-15 LAB — CBC WITH DIFFERENTIAL/PLATELET
Abs Immature Granulocytes: 0.05 10*3/uL (ref 0.00–0.07)
Basophils Absolute: 0 10*3/uL (ref 0.0–0.1)
Basophils Relative: 0 %
Eosinophils Absolute: 0.1 10*3/uL (ref 0.0–0.5)
Eosinophils Relative: 1 %
HCT: 26.1 % — ABNORMAL LOW (ref 36.0–46.0)
Hemoglobin: 8.8 g/dL — ABNORMAL LOW (ref 12.0–15.0)
Immature Granulocytes: 1 %
Lymphocytes Relative: 9 %
Lymphs Abs: 1 10*3/uL (ref 0.7–4.0)
MCH: 28.5 pg (ref 26.0–34.0)
MCHC: 33.7 g/dL (ref 30.0–36.0)
MCV: 84.5 fL (ref 80.0–100.0)
Monocytes Absolute: 0.7 10*3/uL (ref 0.1–1.0)
Monocytes Relative: 7 %
Neutro Abs: 8.6 10*3/uL — ABNORMAL HIGH (ref 1.7–7.7)
Neutrophils Relative %: 82 %
Platelets: 145 10*3/uL — ABNORMAL LOW (ref 150–400)
RBC: 3.09 MIL/uL — ABNORMAL LOW (ref 3.87–5.11)
RDW: 13.7 % (ref 11.5–15.5)
WBC: 10.4 10*3/uL (ref 4.0–10.5)
nRBC: 0 % (ref 0.0–0.2)

## 2021-06-15 LAB — CBC
HCT: 30.4 % — ABNORMAL LOW (ref 36.0–46.0)
Hemoglobin: 10.1 g/dL — ABNORMAL LOW (ref 12.0–15.0)
MCH: 28.1 pg (ref 26.0–34.0)
MCHC: 33.2 g/dL (ref 30.0–36.0)
MCV: 84.7 fL (ref 80.0–100.0)
Platelets: 230 10*3/uL (ref 150–400)
RBC: 3.59 MIL/uL — ABNORMAL LOW (ref 3.87–5.11)
RDW: 13.4 % (ref 11.5–15.5)
WBC: 9.9 10*3/uL (ref 4.0–10.5)
nRBC: 0.2 % (ref 0.0–0.2)

## 2021-06-15 LAB — PREPARE RBC (CROSSMATCH)

## 2021-06-15 MED ORDER — FERROUS SULFATE 325 (65 FE) MG PO TABS
325.0000 mg | ORAL_TABLET | ORAL | Status: DC
  Administered 2021-06-15: 325 mg via ORAL
  Filled 2021-06-15 (×2): qty 1

## 2021-06-15 MED ORDER — OXYCODONE HCL 5 MG PO TABS
5.0000 mg | ORAL_TABLET | Freq: Four times a day (QID) | ORAL | Status: DC | PRN
  Administered 2021-06-15 – 2021-06-16 (×4): 10 mg via ORAL
  Filled 2021-06-15 (×4): qty 2

## 2021-06-15 MED ORDER — CEFAZOLIN SODIUM-DEXTROSE 2-4 GM/100ML-% IV SOLN
2.0000 g | Freq: Once | INTRAVENOUS | Status: AC
  Administered 2021-06-15: 2 g via INTRAVENOUS
  Filled 2021-06-15: qty 100

## 2021-06-15 MED ORDER — OXYTOCIN-SODIUM CHLORIDE 30-0.9 UT/500ML-% IV SOLN
INTRAVENOUS | Status: AC
  Filled 2021-06-15: qty 500

## 2021-06-15 MED ORDER — METHYLERGONOVINE MALEATE 0.2 MG PO TABS
0.2000 mg | ORAL_TABLET | Freq: Once | ORAL | Status: AC
  Administered 2021-06-15: 0.2 mg via ORAL
  Filled 2021-06-15: qty 1

## 2021-06-15 MED ORDER — OXYTOCIN-SODIUM CHLORIDE 30-0.9 UT/500ML-% IV SOLN
10.0000 [IU]/h | INTRAVENOUS | Status: DC
  Administered 2021-06-15: 7.5 [IU]/h via INTRAVENOUS

## 2021-06-15 MED ORDER — LACTATED RINGERS IV BOLUS
500.0000 mL | Freq: Once | INTRAVENOUS | Status: AC
  Administered 2021-06-15: 500 mL via INTRAVENOUS

## 2021-06-15 MED ORDER — FENTANYL CITRATE (PF) 100 MCG/2ML IJ SOLN: 100.0000 ug | Freq: Once | INTRAMUSCULAR | Status: AC

## 2021-06-15 MED ORDER — TRANEXAMIC ACID-NACL 1000-0.7 MG/100ML-% IV SOLN
INTRAVENOUS | Status: AC
  Administered 2021-06-15: 1000 mg via INTRAVENOUS
  Filled 2021-06-15: qty 100

## 2021-06-15 MED ORDER — SODIUM CHLORIDE 0.9 % IV SOLN: 10.0000 mL/h | Freq: Once | INTRAVENOUS | Status: DC

## 2021-06-15 MED ORDER — TRANEXAMIC ACID-NACL 1000-0.7 MG/100ML-% IV SOLN: 1000.0000 mg | INTRAVENOUS | Status: AC

## 2021-06-15 MED ORDER — FENTANYL CITRATE (PF) 100 MCG/2ML IJ SOLN
INTRAMUSCULAR | Status: AC
  Administered 2021-06-15: 100 ug via INTRAVENOUS
  Filled 2021-06-15: qty 2

## 2021-06-15 MED ORDER — OXYCODONE HCL 5 MG PO TABS: 5.0000 mg | ORAL_TABLET | Freq: Four times a day (QID) | ORAL | Status: DC | PRN

## 2021-06-15 MED ORDER — LACTATED RINGERS IV BOLUS
1000.0000 mL | Freq: Once | INTRAVENOUS | Status: AC
  Administered 2021-06-15: 1000 mL via INTRAVENOUS

## 2021-06-15 NOTE — Progress Notes (Signed)
Evaluated patient at bedside with RN for JADA removal process. Vitals stable. Patient resting comfortably.   No bleeding from around the JADA noted. Removed vacuum suction and cervical balloon deflated. Will keep JADA in place for the next 30 minutes, if bleeding ceased, will remove at that time.  If continued vaginal bleeding, will return to suctioning.   Allayne Stack, DO

## 2021-06-15 NOTE — Clinical Social Work Maternal (Signed)
CLINICAL SOCIAL WORK MATERNAL/CHILD NOTE  Patient Details  Name: Sara Manning MRN: [id number] Date of Birth: April 23, 1986  Date:  06/15/2021  Clinical Social Worker Initiating Note:  Kathrin Greathouse, [place] Date/Time: Initiated:  06/15/21/1515     Child's Name:  Sara Manning A: Sara Manning Baby B: Sara Manning   Biological Parents:  Mother, Father (FOB: Durward Mallard 02-17-1988)   Need for Interpreter:  None   Reason for Referral:  Behavioral Health Concerns   Address:  [place] [address]    Phone number:  [phone] (home)     Additional phone number:   Household Members/Support Persons (HM/SP):   Household Member/Support Person 1, Household Member/Support Person 2, Household Member/Support Person 3, Household Member/Support Person 4, Household Member/Support Person 5   HM/SP Name Relationship DOB or Age  HM/SP -[place] Other 57  HM/SP -[place] Daughter 59  HM/SP -[place] Son 11  HM/SP -4 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 Son 7  HM/SP -5        HM/SP -6        HM/SP -7        HM/SP -8          Natural Supports (not living in the home):  Extended Family   Professional Supports: None   Employment: Unemployed   Type of Work:     Education:  [place] arranged:    Museum/gallery curator Resources:  [employer]   Other Resources:  [employer], Physicist, medical     Cultural/Religious Considerations Which May Impact Care:    Strengths:  Ability to meet basic needs  , Home prepared for child  , Pediatrician chosen   Psychotropic Medications:         Pediatrician:    [place] area  Pediatrician List:   [place]  [place]      Pediatrician Fax Number:    Risk Factors/Current Problems:      Cognitive State:  Able to Concentrate  , Insightful  , Alert  , Linear Thinking     Mood/Affect:  Calm  ,  Comfortable  , Relaxed     CSW Assessment: CSW received consult for hx of Bipolar II. CSW met with MOB to offer support and complete assessment.     CSW met with MOB at bedside and introduced role. CSW observed MOB sitting up in bed, FOB holding an infant and her daughter sitting in chair holding an infant. FOB sister was also present. MOB presented pleasant and calm. CSW offered to return later but CSW welcomed CSW to complete the assessment and share information in front of FOB and visitors. MOB stated, " they know about it, and have some qualities themselves." MOB confirmed the demographic information on file is correct. MOB reported she lives in the home with FOB and her three other children (see chart above). CSW inquired how MOB has been feeling since giving birth. MOB shared she feeling anxious which is new for her since being pregnant. MOB shared during the pregnancy she experienced what felt like panic attacks. MOB shared she is open to taking medication for her symptoms. MOB shared she will discuss her options with her nurse and physician. CSW inquired about MOB history of Bipolar II disorder. MOB disclosed she was diagnosed in 2012 with Bipolar II w/ depressive episodes. MOB shared she  experiences both anxiety and depression symptoms. MOB shared she is open to ongoing counseling. She was seeing [facility] specialist through [facility] during her pregnancy. CSW provided MOB with a list of counseling agencies and therapist. MOB appreciative of the resources. CSW inquired if MOB experienced postpartum depression/anxiety. MOB shared it seemed she may experience PPD past based on the depressive symptoms discussed. CSW inquired about MOB coping mechanisms. MOB shared engages in craft activities. CSW praised MOB for her efforts and encouraged her to continue. CSW provided education regarding the baby blues period vs. perinatal mood disorders, discussed treatment and gave resources for mental health follow up. CSW  recommended MOB complete a self-evaluation during the postpartum time period using the New Mom Checklist from Postpartum Progress and encouraged MOB to contact a medical professional if symptoms are noted at any time. CSW assessed MOB for safety. MOB denied thoughts of harm to self and others. CSW inquired about MOB supports. MOB identified FOB, FOB sister and her children as supports.   CSW provided review of Sudden Infant Death Syndrome (SIDS) precautions. CSW reported she has car seats, diapers, and bassinets. MOB shared needs assistance with diapers and wipes. CSW informed MOB about Manufacturing systems engineer. MOB gave CSW permission to make a referral. MOB has chosen [facility] for infant's follow up care. MOB confined she has transportation to appointments. MOB shared she also receives both WIC/FS benefits. CSW assessed MOB for additional needs. MOB reported no further needs.   CSW identifies no further need for intervention and no barriers to discharge at this time.  CSW Plan/Description:  Sudden Infant Death Syndrome (SIDS) Education, No Further Intervention Required/No Barriers to Discharge, Perinatal Mood and Anxiety Disorder (PMADs) Education    Lia Hopping, LCSW 06/15/2021, 4:39 PM

## 2021-06-15 NOTE — Progress Notes (Signed)
Called to bedside for postpartum hemorrhage.  RN performing fundal rub, current EBL 980cc, BP 80-90/60s. Uterus appeared firm, but well above the umbilicus and per RN it was previously at umbilicus.  2nd line placed, TXA started, IV fentanyl, given as pt reported considerable pelvic discomfort during fundal rub.  Pt had received IV Methergine earlier in the evening prior to hemorrhage.  Foley catheter placed with return of dark yellow urine.  Manual exploration completed- noted to have at least another 1000cc of blood and clots.  JADA placed without difficulty- 100cc placed in balloon and hooked to suction.  Fundus now noted @ umbilcus and firm.  Blood noted in tubing, but nothing returned to the cannister.  Labs were drawn.   Total EBL 2020cc  Discussed concerns with patient and family regarding large blood loss in rapid time frame.  Advised transfusion of at least one possible 2 units of pRBCs.  Risk/benefits of transfusions reviewed, inform consent obtained.  Plan as follows: -continue IV fluids @ 125cc/hr -additional dose of Methergine x 1 in 4hrs -JADA in place, to be removed in 1hr -Transfuse 1upRBC, will evaluate pt and consider additional unit if needed -Foley in place, plan for strict I/Os -pain management- Ibuprofen, tylenol prn -routine postpartum care  Myna Hidalgo, DO Attending Obstetrician & Gynecologist, [facility] for [employer], [facility]

## 2021-06-15 NOTE — Progress Notes (Signed)
Evaluated patient at bedside. No significant bleeding around JADA. JADA removed with ease. Will monitor for any further bleeding. About to finish her 1U pRBC transfusion, CBC ordered for later this am.   Allayne Stack, DO

## 2021-06-15 NOTE — Lactation Note (Signed)
This note was copied from a baby's chart. Lactation Consultation Note  Patient Name: Sara Manning QBHAL'P Date: 06/15/2021 Reason for consult: Follow-up assessment;Multiple gestation;Infant < 6lbs;Early term 37-38.6wks Age:34 hours  Twins. [redacted]w[redacted]d.  PPH. Mother states she wants to breastfeed and formula feed but is too weak to try now. FOB reiterated that at this time they are formula feeding. DEBP is set up in room.   Lactation to follow up later.  Provided lactation brochure.    Feeding Mother's Current Feeding Choice: Breast Milk and Formula   Lactation Tools Discussed/Used Tools: Pump Breast pump type: Double-Electric Breast Pump Reason for Pumping: supplementation and stimulation  Interventions Interventions: LC Services brochure   Consult Status Consult Status: Follow-up Date: 06/15/21 Follow-up type: In-patient    Dahlia Byes [facility] 06/15/2021, 9:16 AM

## 2021-06-15 NOTE — Progress Notes (Signed)
I performed pt's hr out check at 2305 and noted a moderate amount of bleeding with a ping pong ball sized clot. Patient's fundus was firm and midline. I called Dr. Annia Friendly regarding bleeding at 2330 and she ordered methergine. Methergine was given at 2343 and I went to do another fundal rub. Patient was firm but 1/U and a large amount of large clots came out during the fundal rub. The charge nurse was already in the room and RN called for further assistance. A code hemorrhage was called and Dr. Charlotta Newton came to the bedside. Patients blood pressure was noted to be decreased at 90's/60's and another line and IV was placed in patients left hand. A bolus of 1,000 mL's was given of LR, along with TXA. Dr. Charlotta Newton did a manual sweep where another large amount of bleeding and clots were removed. The Jada and a foley catheter was placed at 0015. Dr. Charlotta Newton placed an order for 1 unit of RBC's. Patients fundus was noted to be back at the umbilicus, firm, and bleeding stopped. Vitals are stable and patient is asleep with family at bedside. Will continue to monitor I&O's, bleeding, and vitals.   Cheri Guppy RN

## 2021-06-15 NOTE — Progress Notes (Signed)
Lower extremity venous has been completed.   Preliminary results in CV Proc.   Sara Manning 06/15/2021 9:38 AM

## 2021-06-15 NOTE — Progress Notes (Signed)
Post Partum Day 1 Subjective: Feeling quite weak and fatigued this morning. Also with continued crampy pain. Endorses painful swelling of BLE that is worse today than previous.   Objective: Blood pressure 120/88, pulse 83, temperature 98.2 F (36.8 C), temperature source Oral, resp. rate 17, height 5\' 7"  (1.702 m), weight 93.5 kg, last menstrual period 12/21/2019, SpO2 100 %, unknown if currently breastfeeding.  Physical Exam:  General: fatigued and no distress Lochia: appropriate Uterine Fundus: firm DVT Evaluation: Bilateral edema, erythema, and warmth R>L. Positive Homan Sign bilaterally.   Recent Labs    06/14/21 0748 06/15/21 0010  HGB 12.0 10.1*  HCT 36.6 30.4*    Assessment/Plan: Plan for discharge tomorrow, Breastfeeding, and Contraception remains undecided.   #Postpartum Hemmorhage: Had 2L hemmorhage around midnight. S/p Jada. Received 1u PRBC. CBC pending. May require another unit of blood. Foley to be removed at 2p today.   #BLE Edema: Given worsening of pain and swelling, will obtain DVT 06/17/21.    LOS: 1 day   [place] 06/15/2021, 7:37 AM

## 2021-06-16 ENCOUNTER — Encounter (HOSPITAL_COMMUNITY): Payer: Self-pay | Admitting: Family Medicine

## 2021-06-16 ENCOUNTER — Other Ambulatory Visit (HOSPITAL_COMMUNITY): Payer: Self-pay

## 2021-06-16 LAB — PATHOLOGIST SMEAR REVIEW

## 2021-06-16 MED ORDER — SENNOSIDES-DOCUSATE SODIUM 8.6-50 MG PO TABS
2.0000 | ORAL_TABLET | ORAL | 0 refills | Status: DC
  Filled 2021-06-16: qty 6, 3d supply, fill #0

## 2021-06-16 MED ORDER — IBUPROFEN 600 MG PO TABS
600.0000 mg | ORAL_TABLET | Freq: Four times a day (QID) | ORAL | 0 refills | Status: DC
  Filled 2021-06-16: qty 30, 8d supply, fill #0

## 2021-06-16 MED ORDER — SIMETHICONE 80 MG PO CHEW
80.0000 mg | CHEWABLE_TABLET | ORAL | 0 refills | Status: DC | PRN
  Filled 2021-06-16: qty 30, 30d supply, fill #0

## 2021-06-16 MED ORDER — HYDROCORTISONE 1 % EX CREA
1.0000 "application " | TOPICAL_CREAM | Freq: Two times a day (BID) | CUTANEOUS | Status: DC
  Filled 2021-06-16: qty 28

## 2021-06-16 MED ORDER — LORATADINE 10 MG PO TABS
10.0000 mg | ORAL_TABLET | Freq: Two times a day (BID) | ORAL | 0 refills | Status: DC
  Filled 2021-06-16: qty 60, 30d supply, fill #0

## 2021-06-16 MED ORDER — DEXAMETHASONE SODIUM PHOSPHATE 10 MG/ML IJ SOLN
10.0000 mg | Freq: Once | INTRAMUSCULAR | Status: AC
  Administered 2021-06-16: 10 mg via INTRAVENOUS
  Filled 2021-06-16: qty 1

## 2021-06-16 MED ORDER — HYDROCORTISONE 2.5 % EX CREA
TOPICAL_CREAM | Freq: Two times a day (BID) | CUTANEOUS | 0 refills | Status: DC
  Filled 2021-06-16: qty 40, 28d supply, fill #0

## 2021-06-16 MED ORDER — HYDROCORTISONE 1 % EX LOTN
TOPICAL_LOTION | Freq: Two times a day (BID) | CUTANEOUS | Status: DC
  Filled 2021-06-16: qty 118

## 2021-06-16 MED ORDER — TRIAMCINOLONE ACETONIDE 0.1 % EX OINT
TOPICAL_OINTMENT | Freq: Two times a day (BID) | CUTANEOUS | Status: DC | PRN
  Filled 2021-06-16: qty 15

## 2021-06-16 MED ORDER — OXYCODONE HCL 5 MG PO TABS
5.0000 mg | ORAL_TABLET | Freq: Three times a day (TID) | ORAL | 0 refills | Status: DC | PRN
  Filled 2021-06-16: qty 5, 2d supply, fill #0

## 2021-06-16 MED ORDER — LORATADINE 10 MG PO TABS
10.0000 mg | ORAL_TABLET | Freq: Every day | ORAL | Status: DC
  Administered 2021-06-16: 10 mg via ORAL
  Filled 2021-06-16: qty 1

## 2021-06-16 NOTE — Lactation Note (Signed)
This note was copied from a baby's chart. Lactation Consultation Note  Patient Name: Sara Manning NTZGY'F Date: 06/16/2021 Reason for consult: Follow-up assessment;Multiple gestation;Early term 37-38.6wks Age:34 hours  LC in to visit with P5 Mom of ET twins.  Both babies are at 6% weight loss and exclusively bottle feeding formula.    LC asked Mom if she needed any assistance and how she planned to feed babies and she said she wanted to do both breastfeed and bottle feed.  Educated Mom on importance of regular pumping at each feeding to support her milk supply.  Mom hasn't pumped but states she plans to in an hour.  Reviewed how to pump on initiation setting and LC sized flanges (24 mm).  Reviewed breast massage and hand expression, drops of colostrum expressed.  LC provided bins for washing and drying disassembled pump parts, reviewing this with parents.   Baby A woke and started crying.  Mom placed baby STS on her chest and baby was rooting around.  Offered to help if Mom wanted to latch baby and Mom made a face but then agreed.  Baby latched on in cradle laid back hold easily and noted to have nutritive sucking and swallowing.    Encouraged STS and offering breast with cues.  Mom knows she can ask for help prn.  Encouraged Mom to double pump if babies are fed by bottle. [facility] referral sent.  Mom states WIC has already called her and offered her an appointment today.  She is cancelling appt until Monday.  [facility] loaner program reviewed.    LATCH Score Latch: Grasps breast easily, tongue down, lips flanged, rhythmical sucking.  Audible Swallowing: Spontaneous and intermittent  Type of Nipple: Everted at rest and after stimulation  Comfort (Breast/Nipple): Soft / non-tender  Hold (Positioning): Assistance needed to correctly position infant at breast and maintain latch.  LATCH Score: 9   Lactation Tools Discussed/Used Tools: Pump;Flanges;Bottle Flange Size: 24 Breast pump  type: Double-Electric Breast Pump Pump Education: Setup, frequency, and cleaning;Milk Storage Reason for Pumping: support milk supply/ET infants Pumping frequency: Encouraged to pump 6-8 times per 24 hrs. Pumped volume: 0 mL  Interventions Interventions: Breast feeding basics reviewed;Assisted with latch;Skin to skin;Breast massage;Hand express;Support pillows;Position options;DEBP  Discharge Pump: DEBP;Manual WIC Program: Yes  Consult Status Consult Status: Follow-up Date: 06/17/21 Follow-up type: In-patient    Judee Clara 06/16/2021, 1:30 PM

## 2021-06-16 NOTE — Discharge Summary (Signed)
Postpartum Discharge Summary    Patient Name: Sara Manning DOB: 1985/11/12 MRN: [id number]  Date of admission: 06/14/2021 Delivery date:   Sara, Manning [[id number]]  06/14/2021    Sara, Manning [[id number]]  06/14/2021  Delivering provider:    [name], Manning [[id number]]  Sara, Manning [[id number]]  Sara Manning  Date of discharge: 06/16/2021  Admitting diagnosis: Indication for care in labor or delivery [O75.9] Intrauterine pregnancy: [redacted]w[redacted]d    Secondary diagnosis:  Active Problems:   Gastroesophageal reflux disease   Bipolar disorder, current episode depressed, moderate (HSaltaire   Supervision of high-risk pregnancy   Twin pregnancy   Indication for care in labor or delivery  Additional problems: PUPPS/AEP    Discharge diagnosis: Term Pregnancy Delivered                                              Postpartum procedures:blood transfusion Augmentation: AROM and Pitocin Complications: [id number] Manning course: Induction of Labor With Vaginal Delivery   35y.o. yo [id number] 329w1das admitted to the Manning 06/14/2021 for induction of labor.  Indication for induction:  Twin gestation with 6% discordance .  Patient had an uncomplicated labor course as follows: Membrane Rupture Time/Date:    Sara, [name]    S[facility], [name] ,   Sara, Mohiuddinirl ShCollier Salina0[[id number]]06/14/2021    Sara, Stern0[[id number]]06/14/2021   Delivery Method:   S[facility] Sohoirl [facility][[id number]]Vaginal, Spontaneous    [facility], [name], Spontaneous  Episiotomy:    Sara, [name]    [facility], [name]  Lacerations:     Sara, [name]    Sara, [name]  Details of delivery can be found in separate delivery note.  Patient had a routine postpartum course.  Patient is discharged home 06/16/21.  Newborn Data: Birth date:   Sara, Litzenberger0[[id number]]06/14/2021    Sara, Perkovich0[[id number]]06/14/2021  Birth time:   Sara, [name]    [facility], [name]  Gender:   Sara, [name]    S[facility], [name]  Living status:   Sara, [name]    Sara, [name]  Apgars:   Sara, [name]   Sara, [name]  Sara, [name]  Weight:   Sara, [name]    Sara, [name]   Magnesium Sulfate received: No BMZ received: No Rhophylac:N/A MMR:No T-DaP:Given prenatally Flu: No Transfusion:Yes  Physical exam  Vitals:   06/15/21 0731 06/15/21 1219 06/15/21 2051 06/16/21 0520  BP: 120/88 131/81 111/79 110/75  Pulse: 83 96 89 78  Resp:  _0 Temp: 98.2 F (36.8 C) 97.9 F (36.6 C) 97.9 F (36.6 C) 98.6 F (37 C)  TempSrc: Oral Oral Oral Oral  SpO2: 100%  99% 99%  Weight:  Height:       General: alert, cooperative, and no distress Lochia: appropriate Uterine Fundus: firm Incision: N/A DVT Evaluation: No evidence of DVT seen on physical exam.  Pt still complaining of severe itching all over with rash present on her face, arms, and legs. Still has itching in the striae on her belly but is avoiding scratching because her skin is tender and she is having after pains. Ordered a dose of decadron, loratadine and hydrocortisone lotion. Will send home on loratadine and lotion.  Social work reported to [employer] that they recommended pt be started on an anti-anxiety med. Pt declined, states she copes well and has a good support Manning in her partner. Aware of resources and amenable to  calling for therapy/meds if needed.  Has not yet had a BM and reports some nausea/vomiting but is passing flatus. Has not been eating much. Encouraged to try to eat something more substantial, take stool softener/simethicone and walk today.  Babies not being discharged until Sunday, will discharge to the room with Meds-to-Beds consult.  Labs: Lab Results  Component Value Date   WBC 10.4 06/15/2021   HGB 8.8 (L) 06/15/2021   HCT 26.1 (L) 06/15/2021   MCV 84.5 06/15/2021   PLT 145 (L) 06/15/2021   CMP Latest Ref Rng & Units 03/30/2017  Glucose 65 - 99 mg/dL 91  BUN 6 - 20 mg/dL 5(L)  Creatinine 0.44 - 1.00 mg/dL 0.77  Sodium 135 - 145 mmol/L 138  Potassium 3.5 - 5.1 mmol/L 3.5  Chloride 101 - 111 mmol/L 106  CO2 22 - 32 mmol/L 24  Calcium 8.9 - 10.3 mg/dL 9.2  Total Protein 6.5 - 8.1 g/dL 7.4  Total Bilirubin 0.3 - 1.2 mg/dL 0.6  Alkaline Phos 38 - 126 U/L 92  AST 15 - 41 U/L 22  ALT 14 - 54 U/L 25   Edinburgh Score: Edinburgh Postnatal Depression Scale Screening Tool 06/16/2021  I have been able to laugh and see the funny side of things. 0  I have looked forward with enjoyment to things. 0  I have blamed myself unnecessarily when things went wrong. 1  I have been anxious or worried for no good reason. 2  I have felt scared or panicky for no good reason. 2  Things have been getting on top of me. 1  I have been so unhappy that I have had difficulty sleeping. 2  I have felt sad or miserable. 2  I have been so unhappy that I have been crying. 2  The thought of harming myself has occurred to me. 0  Edinburgh Postnatal Depression Scale Total 12   After visit meds:  Allergies as of 06/16/2021   No Known Allergies      Medication List     STOP taking these medications    famotidine 20 MG tablet Commonly known as: Pepcid   predniSONE 5 MG tablet Commonly known as: DELTASONE       TAKE these medications    Blood Pressure Kit Use weekly   [facility] Prenatal 27-0.8 MG  Tabs TAKE 1 TABLET BY MOUTH EVERY DAY   hydrocortisone 1 % lotion Apply topically 2 (two) times daily.   ibuprofen 600 MG tablet Commonly known as: ADVIL Take 1 tablet (600 mg total) by mouth every 6 (six) hours.   loratadine 10 MG tablet Commonly known as: CLARITIN Take 1 tablet (10 mg total) by mouth 2 (two) times daily. Can decrease to one pill daily if itching/rash improves. Stay  on daily until at least a month postpartum.   oxyCODONE 5 MG immediate release tablet Commonly known as: Roxicodone Take 1 tablet (5 mg total) by mouth every 8 (eight) hours as needed.   senna-docusate 8.6-50 MG tablet Commonly known as: Senokot-S Take 2 tablets by mouth daily. Start taking on: June 17, 2021   simethicone 80 MG chewable tablet Commonly known as: MYLICON Chew 1 tablet (80 mg total) by mouth as needed for flatulence.       Discharge home in stable condition Infant Feeding: Bottle and Breast Infant Disposition:rooming in Discharge instruction: per After Visit Summary and Postpartum booklet. Activity: Advance as tolerated. Pelvic rest for 6 weeks.  Diet: routine diet Future Appointments: Future Appointments  Date Time Provider [place]  07/13/2021  2:15 PM Woodroe Mode, MD [facility]   Follow up Visit: Please schedule this patient for a In person postpartum visit in 4 weeks with the following provider: Any provider. Additional Postpartum F/U: None   High risk pregnancy complicated by:  Twin to twin discordance Delivery mode:     Carlynn, Leduc [[id number]]  Vaginal, Spontaneous    Trinady, Milewski [[id number]]  Vaginal, Spontaneous  Anticipated Birth Control:  Unsure  06/16/2021 Gabriel Carina, CNM

## 2021-06-16 NOTE — Social Work (Signed)
CSW received consult due to score 12 on Edinburgh Depression Screen.    CSW met with MOB on 9/29 and completed a full assessment. CSW also provided education regarding Baby Blues vs PMADs and provided MOB with resources for mental health follow up.  CSW encouraged MOB to evaluate her mental health throughout the postpartum period with the use of the New Mom Checklist developed by Postpartum Progress as well as the Edinburgh Postnatal Depression Scale and notify a medical professional if symptoms arise.   Sara Manning, MSW, LCSW [facility]  Clinical Social Worker  [phone] 06/16/2021  10:54 AM  

## 2021-06-17 ENCOUNTER — Telehealth: Payer: Self-pay | Admitting: Family Medicine

## 2021-06-17 ENCOUNTER — Ambulatory Visit: Payer: Self-pay

## 2021-06-17 NOTE — Lactation Note (Signed)
This note was copied from a baby's chart. Lactation Consultation Note  Patient Name: Sara Manning QKSKS'H Date: 06/17/2021 Reason for consult: Follow-up assessment;Early term 37-38.6wks;Infant < 6lbs;Other (Comment);Infant weight loss (6 % weight loss) Age:34 hours Mom updated LC of the I/O;s .  Mom mentioned she had a WIC appt this coming Tuesday at 10 am .  LC offered mom a [facility] loaner and mom declined and expressed she wanted to keep it simple and use a hand pump . LC reminded mom not to forget her DEBP for [facility] and provided the hand pump for pumping.  LC reviewed supply and demand / importance of consistent stimulation of the babies feeding at the breast or pumping.  Mom has the [facility] brochure with resources.   Maternal Data    Feeding Mother's Current Feeding Choice: Breast Milk and Formula Nipple Type: Slow - flow  LATCH Score                    Lactation Tools Discussed/Used Flange Size: 24 Breast pump type: Manual;Double-Electric Breast Pump Pump Education: Milk Storage  Interventions    Discharge Discharge Education: Engorgement and breast care;Warning signs for feeding baby Pump: DEBP;Manual WIC Program: Yes  Consult Status Consult Status: Complete Date: 06/17/21    Kathrin Greathouse 06/17/2021, 2:10 PM

## 2021-06-17 NOTE — Telephone Encounter (Signed)
**  After Hours/ Emergency Line Call**  Received after hours call from [facility] requesting refill on her pain medication. Patient was just discharged from the hospital s/p vaginal delivery of twins (cephalic + breech extraction). She was discharged with 5 tablets of oxycodone 5mg . Patient reports she has taken 4 tablets (2 last night, 2 this morning) and feels she needs additional pills due to abdominal cramping. She is also taking Ibuprofen 600mg .  Advised patient to call her OB provider. Patient was given the phone number for [facility], as they provided her prenatal care and her OB care during admission. She was agreeable with this plan.   , MD PGY-2 [facility]

## 2021-06-18 LAB — TYPE AND SCREEN
ABO/RH(D): O POS
Antibody Screen: NEGATIVE
Unit division: 0
Unit division: 0
Unit division: 0
Unit division: 0

## 2021-06-18 LAB — BPAM RBC
Blood Product Expiration Date: 202210252359
Blood Product Expiration Date: 202210252359
Blood Product Expiration Date: 202210292359
Blood Product Expiration Date: 202210302359
ISSUE DATE / TIME: 202209290023
ISSUE DATE / TIME: 202209290023
Unit Type and Rh: 5100
Unit Type and Rh: 5100
Unit Type and Rh: 5100
Unit Type and Rh: 5100

## 2021-06-19 ENCOUNTER — Ambulatory Visit: Payer: Medicaid Other

## 2021-06-19 ENCOUNTER — Telehealth: Payer: Self-pay | Admitting: General Practice

## 2021-06-19 NOTE — Telephone Encounter (Signed)
Patient called and left message on nurse voicemail line stating she is having pain in the bottom of her stomach and would like for someone to call her back.   Called patient, no answer- left message to call us back if you still need assistance.

## 2021-06-24 ENCOUNTER — Telehealth: Payer: Self-pay | Admitting: Certified Nurse Midwife

## 2021-06-24 ENCOUNTER — Telehealth (HOSPITAL_COMMUNITY): Payer: Self-pay

## 2021-06-24 DIAGNOSIS — Z1331 Encounter for screening for depression: Secondary | ICD-10-CM

## 2021-06-24 DIAGNOSIS — F418 Other specified anxiety disorders: Secondary | ICD-10-CM

## 2021-06-24 MED ORDER — ALPRAZOLAM 0.5 MG PO TABS
0.5000 mg | ORAL_TABLET | Freq: Three times a day (TID) | ORAL | 2 refills | Status: DC | PRN
Start: 2021-06-24 — End: 2021-07-25

## 2021-06-24 NOTE — Telephone Encounter (Addendum)
"  I'm feeling ok." Patient declines questions or concerns about her healing.  "They are good, eating and gaining weight. They sleep in a bassinet and crib."  RN reviewed ABC's of safe sleep with patient. Patient declines any questions or concerns about baby.  EPDS score is 18. Referral placed for integrated behavioral health.  RN told patient about Maternal Mental Health Resources ([facility], National Maternal Mental Health Hotline, Postpartum Support International). Also told patient about [facility] support group offerings. Will e-mail these resources to patient as well.   Marcelino Duster [facility] 06/24/2021,1527

## 2021-06-24 NOTE — Telephone Encounter (Signed)
1wk postpartum EPDS=18, RN called to notify MD Milas Hock). Called patient to check in with her given she has a child with a new cancer diagnosis.   Patient has a history of bipolar disorder previously treated with seroquel which she says did not work. Is mostly feeling anxious with occasional panic attacks but is hesitant to try something long-acting since meds have not historically worked well for her. Is breastfeeding occasionally, but only by pump. Offered xanax at the lower dose for prn use, pt amenable to trying it. Also encouraged her to speak with [name] (CSW at [facility]) again and reach out if we can support her in any other way. Pt agreed. She has a referral placed for IBH and was given a list of community mental health resources.  Edd Arbour, CNM, MSN, IBCLC Certified Nurse Midwife, [facility]

## 2021-06-26 NOTE — BH Specialist Note (Signed)
Integrated Behavioral Health via Telemedicine Visit  06/26/2021 [name] [id number]  Number of Integrated Behavioral Health visits: 3 Session Start time: 2:20  Session End time: 2:52 Total time:  32  Referring Provider: Catalina Antigua, MD [facility] Provider location: [facility]  All persons participating in visit: [name] and [facility] Lajuana Patchell   Types of Service: Individual psychotherapy and Telephone visit  I connected with LITZY DICKER and/or Harleyquinn M Sortor's  n/a  via  Telephone or Video Enabled Telemedicine Application  (Video is Caregility application) and verified that I am speaking with the correct person using two identifiers. Discussed confidentiality: Yes   I discussed the limitations of telemedicine and the availability of in person appointments.  Discussed there is a possibility of technology failure and discussed alternative modes of communication if that failure occurs.  I discussed that engaging in this telemedicine visit, they consent to the provision of behavioral healthcare and the services will be billed under their insurance.  Patient and/or legal guardian expressed understanding and consented to Telemedicine visit: Yes   Presenting Concerns: Patient and/or family reports the following symptoms/concerns: Overwhelmed with current life stress (14yo daughter's cancer diagnosis, while adjusting to newborn twins' schedule and placement at second temporary housing location); sleep deprivation and poor appetite. Pt felt "no difference" after taking Xanax first time.  Duration of problem: Increase in over one month; Severity of problem:  moderately severe  Patient and/or Family's Strengths/Protective Factors: Social connections and Sense of purpose  Goals Addressed: Patient will:  Reduce symptoms of: anxiety, depression, insomnia, and stress   Demonstrate ability  to: Increase healthy adjustment to current life circumstances  Progress towards Goals: Ongoing  Interventions: Interventions utilized:  Medication Monitoring, Psychoeducation and/or Health Education, and Supportive Reflection Standardized Assessments completed: GAD-7 and PHQ 9  Patient and/or Family Response: Pt agrees with treatment plan  Assessment: Patient currently experiencing Bipolar 2 disorder(as previously diagnosed via psychiatry) and Psychosocial stress.   Patient may benefit from continued psychoeducation and brief therapeutic interventions regarding coping with symptoms of depression, anxiety, insomnia, stress .  Plan: Follow up with behavioral health clinician on : One month; Call Hudson Lehmkuhl at [phone], as needed. Behavioral recommendations:  -Continue taking BH medication as prescribed -Continue sleeping when babies are sleeping daily, as much as able until postpartum medical visit -Continue plan to ask daughter's medical provider about treatment options (including risks and benefits of each option) Referral(s): Integrated [employer] (In Clinic)  I discussed the assessment and treatment plan with the patient and/or parent/guardian. They were provided an opportunity to ask questions and all were answered. They agreed with the plan and demonstrated an understanding of the instructions.   They were advised to call back or seek an in-person evaluation if the symptoms worsen or if the condition fails to improve as anticipated.  [name], LCSW  Depression screen [facility] 2/9 06/09/2021 05/03/2021 04/18/2021 03/23/2021 02/23/2021  Decreased Interest 3 3 3 3 3   Down, Depressed, Hopeless 3 3 3 1  0  PHQ - 2 Score 6 6 6 4 3   Altered sleeping 3 3 3 3 2   Tired, decreased energy 3 3 3 3 3   Change in appetite 3 3 3 3 3   Feeling bad or failure about yourself  0 0 0 0 0  Trouble concentrating 3 3 3 3 3   Moving slowly or fidgety/restless 0 0 0 0 0  Suicidal thoughts 0  0 0 0 0  PHQ-9 Score 18 18 18 16 14   Some recent data might be hidden   GAD 7 : Generalized Anxiety Score 06/09/2021 05/03/2021 04/18/2021 03/23/2021  Nervous, Anxious, on Edge 3 3 3 3   Control/stop worrying 3 3 3 3   Worry too much - different things 3 3 3 3   Trouble relaxing 3 3 3 3   Restless 3 3 3 3   Easily annoyed or irritable 3 3 3 3   Afraid - awful might happen 0 1 1 1   Total GAD 7 Score 18 19 19  19

## 2021-06-27 ENCOUNTER — Ambulatory Visit (INDEPENDENT_AMBULATORY_CARE_PROVIDER_SITE_OTHER): Payer: Medicaid Other | Admitting: Clinical

## 2021-06-27 DIAGNOSIS — F3181 Bipolar II disorder: Secondary | ICD-10-CM | POA: Diagnosis not present

## 2021-06-27 DIAGNOSIS — Z658 Other specified problems related to psychosocial circumstances: Secondary | ICD-10-CM | POA: Diagnosis not present

## 2021-06-27 NOTE — Patient Instructions (Signed)
[facility] [address] [place], [place] [address] [phone] (main office) [phone] (Cia Garretson's office)   

## 2021-07-06 ENCOUNTER — Encounter: Payer: Self-pay | Admitting: *Deleted

## 2021-07-07 ENCOUNTER — Telehealth: Payer: Self-pay | Admitting: General Practice

## 2021-07-07 NOTE — Telephone Encounter (Signed)
[facility] Nurse called to inform us she performed a home visit on this patient and her Inocente Salles was 20 with no thoughts to self harm. The patient recently saw [name] and has a follow up appointment with her but she wanted to pass that information along. The nurse also stated the patient told her xanax wasn't really working for her & she wanted to know if a different prescription was available. Per chart review, patient has pp visit scheduled next week 10/27. Will route message to [facility]

## 2021-07-13 ENCOUNTER — Encounter: Payer: Self-pay | Admitting: Obstetrics & Gynecology

## 2021-07-13 ENCOUNTER — Other Ambulatory Visit: Payer: Self-pay

## 2021-07-13 ENCOUNTER — Ambulatory Visit (INDEPENDENT_AMBULATORY_CARE_PROVIDER_SITE_OTHER): Payer: Medicaid Other | Admitting: Obstetrics & Gynecology

## 2021-07-13 VITALS — BP 121/86 | HR 90 | Ht 67.0 in | Wt 172.1 lb

## 2021-07-13 DIAGNOSIS — G43809 Other migraine, not intractable, without status migrainosus: Secondary | ICD-10-CM

## 2021-07-13 MED ORDER — SUMATRIPTAN SUCCINATE 50 MG PO TABS: ORAL_TABLET | ORAL | 0 refills | Status: DC

## 2021-07-13 NOTE — Progress Notes (Signed)
    Post Partum Visit Note  Sara Manning is a 35 y.o. [id number] female who presents for a postpartum visit. She is 4 weeks postpartum following a normal spontaneous vaginal delivery.  I have fully reviewed the prenatal and intrapartum course. The delivery was at 37/1 gestational weeks.  Anesthesia: none. Postpartum course has been good. Baby is doing well. Baby is feeding by bottle - [employer] Good Start. Bleeding staining only. Bowel function is normal. Bladder function is normal. Patient is not sexually active. Contraception method is none. Postpartum depression screening: negative.   The pregnancy intention screening data noted above was reviewed. Potential methods of contraception were discussed. The patient elected to proceed with No data recorded.    Health Maintenance Due  Topic Date Due   COVID-19 Vaccine (1) Never done   INFLUENZA VACCINE  04/17/2021    The following portions of the patient's history were reviewed and updated as appropriate: allergies, current medications, past family history, past medical history, past social history, past surgical history, and problem list.  Review of Systems Pertinent items are noted in HPI.  Objective:  LMP 12/21/2019   Blood pressure 121/86, pulse 90, height 5\' 7"  (1.702 m), weight 172 lb 1.6 oz (78.1 kg), last menstrual period 12/21/2019, not currently breastfeeding.  General:  alert, cooperative, and no distress   Breasts:  normal  Lungs:   Heart:  regular rate and rhythm  Abdomen: soft, non-tender; bowel sounds normal; no masses,  no organomegaly   Wound   GU exam:  not indicated       Assessment:    There are no diagnoses linked to this encounter.  normal postpartum exam.   Plan:   Essential components of care per ACOG recommendations:  1.  Mood and well being: Patient with negative depression screening today. Reviewed local resources for support.  - Patient tobacco use? No.   - hx of drug use? No.    2. Infant care  and feeding:  -Patient currently breastmilk feeding? No.  -Social determinants of health (SDOH) reviewed in EPIC. No concerns 3. Sexuality, contraception and birth spacing - Patient does not want a pregnancy in the next year.  Desired family size is 5 children.  - Reviewed forms of contraception in tiered fashion. Patient desired abstinence today.   - Discussed birth spacing of 18 months  4. Sleep and fatigue -Encouraged family/partner/community support of 4 hrs of uninterrupted sleep to help with mood and fatigue  5. Physical Recovery  - Discussed patients delivery and complications. She describes her labor as mixed. - Patient had a Vaginal, no problems at delivery. - Patient has urinary incontinence? No. - Patient is safe to resume physical and sexual activity  6.  Health Maintenance - HM due items addressed Yes - Last pap smear  Diagnosis  Date Value Ref Range Status  11/24/2020   Final   - Negative for intraepithelial lesion or malignancy (NILM)   Pap smear not done at today's visit.  -Breast Cancer screening indicated? No.   7. Chronic Disease/Pregnancy Condition follow up: None  - PCP follow up 01/24/2021, MD [facility]

## 2021-07-13 NOTE — BH Specialist Note (Signed)
Integrated Behavioral Health via Telemedicine Visit  07/13/2021 Sara Manning [id number]  Number of Integrated Behavioral Health visits: 4 Session Start time: 10:31  Session End time: 10:47 Total time:  16  Referring Provider: Catalina Antigua, MD Patient/Family location: Home [facility] Provider location: [facility]  All persons participating in visit: [name] and Sara Manning   Types of Service: Individual psychotherapy and Video visit  I connected with Sara Manning and/or Sara Manning  n/a  via  Telephone or Video Enabled Telemedicine Application  (Video is Caregility application) and verified that I am speaking with the correct person using two identifiers. Discussed confidentiality: Yes   I discussed the limitations of telemedicine and the availability of in person appointments.  Discussed there is a possibility of technology failure and discussed alternative modes of communication if that failure occurs.  I discussed that engaging in this telemedicine visit, they consent to the provision of behavioral healthcare and the services will be billed under their insurance.  Patient and/or legal guardian expressed understanding and consented to Telemedicine visit: Yes   Presenting Concerns: Patient and/or family reports the following symptoms/concerns: Increase in panic and poor sleep quality postpartum; requesting refills of Xanax for panic (taking one/day as needed; pharmacy says she needs appointment with medical provider before receiving refills) and Ambien for sleep.  Duration of problem: Ongoing; Severity of problem:  moderately severe  Patient and/or Family's Strengths/Protective Factors: Social connections, Concrete supports in place (healthy food, safe environments, etc.), and Sense of purpose  Goals Addressed: Patient will:  Reduce symptoms of: anxiety, depression, insomnia, and stress   Progress  towards Goals: Ongoing  Interventions: Interventions utilized:  Motivational Interviewing and Medication Monitoring Standardized Assessments completed: GAD-7 and PHQ 9  Patient and/or Family Response: Pt agrees with treatment plan  Assessment: Patient currently experiencing Bipolar 2 disorder (as previously diagnosed) and Psychosocial stress.   Patient may benefit from continued brief interventions regarding coping with current symptoms.  Plan: Follow up with behavioral health clinician on : Call Sara Manning at [phone], as needed; Sara Manning will call back today to set up f/u visit. Behavioral recommendations:  -Continue taking BH medication as prescribed -Accept referral to psychiatry at [facility] Referral(s): Integrated [employer] (In Clinic)  I discussed the assessment and treatment plan with the patient and/or parent/guardian. They were provided an opportunity to ask questions and all were answered. They agreed with the plan and demonstrated an understanding of the instructions.   They were advised to call back or seek an in-person evaluation if the symptoms worsen or if the condition fails to improve as anticipated.  Sara Lips, LCSW  Depression screen [facility] 2/9 07/25/2021 06/09/2021 05/03/2021 04/18/2021 03/23/2021  Decreased Interest 0 3 3 3 3   Down, Depressed, Hopeless 1 3 3 3 1   PHQ - 2 Score 1 6 6 6 4   Altered sleeping 3 3 3 3 3   Tired, decreased energy 3 3 3 3 3   Change in appetite 3 3 3 3 3   Feeling bad or failure about yourself  1 0 0 0 0  Trouble concentrating 3 3 3 3 3   Moving slowly or fidgety/restless 2 0 0 0 0  Suicidal thoughts 0 0 0 0 0  PHQ-9 Score 16 18 18 18 16   Some recent data might be hidden   GAD 7 : Generalized Anxiety Score 07/25/2021 06/09/2021 05/03/2021 04/18/2021  Nervous, Anxious, on Edge 2 3 3  3  Control/stop worrying 3 3 3 3   Worry too much - different things 3 3 3 3   Trouble relaxing 3 3 3 3   Restless 3 3 3 3   Easily annoyed or  irritable 3 3 3 3   Afraid - awful might happen 1 0 1 1  Total GAD 7 Score 18 18 19  19

## 2021-07-13 NOTE — Progress Notes (Signed)
Pt does not want to discuss birth control right now.

## 2021-07-18 ENCOUNTER — Telehealth: Payer: Self-pay

## 2021-07-21 ENCOUNTER — Other Ambulatory Visit: Payer: Self-pay | Admitting: Obstetrics & Gynecology

## 2021-07-21 DIAGNOSIS — G43809 Other migraine, not intractable, without status migrainosus: Secondary | ICD-10-CM

## 2021-07-25 ENCOUNTER — Telehealth: Payer: Self-pay | Admitting: Clinical

## 2021-07-25 ENCOUNTER — Ambulatory Visit (INDEPENDENT_AMBULATORY_CARE_PROVIDER_SITE_OTHER): Payer: Medicaid Other | Admitting: Clinical

## 2021-07-25 ENCOUNTER — Other Ambulatory Visit: Payer: Self-pay | Admitting: Obstetrics & Gynecology

## 2021-07-25 DIAGNOSIS — F3181 Bipolar II disorder: Secondary | ICD-10-CM | POA: Diagnosis not present

## 2021-07-25 DIAGNOSIS — Z658 Other specified problems related to psychosocial circumstances: Secondary | ICD-10-CM

## 2021-07-25 DIAGNOSIS — F418 Other specified anxiety disorders: Secondary | ICD-10-CM

## 2021-07-25 MED ORDER — ALPRAZOLAM 0.5 MG PO TABS: 0.5000 mg | ORAL_TABLET | Freq: Two times a day (BID) | ORAL | 0 refills | Status: DC | PRN

## 2021-07-25 NOTE — Progress Notes (Signed)
Meds ordered this encounter  Medications   ALPRAZolam (XANAX) 0.5 MG tablet    Sig: Take 1 tablet (0.5 mg total) by mouth 2 (two) times daily as needed for anxiety.    Dispense:  20 tablet    Refill:  0

## 2021-07-25 NOTE — Telephone Encounter (Signed)
Pt aware BH medication has been sent to pharmacy; has initial appointment at [facility] on 09/25/20; and agrees for [facility] to call for [facility] mood check in about one week. Pt will call [name] at [phone] as needed prior to that time.

## 2021-07-29 ENCOUNTER — Other Ambulatory Visit: Payer: Self-pay | Admitting: Certified Nurse Midwife

## 2021-07-29 DIAGNOSIS — F418 Other specified anxiety disorders: Secondary | ICD-10-CM

## 2021-07-29 MED ORDER — ALPRAZOLAM 0.5 MG PO TABS: 0.5000 mg | ORAL_TABLET | Freq: Two times a day (BID) | ORAL | 0 refills | Status: DC | PRN

## 2021-08-03 ENCOUNTER — Encounter: Payer: Self-pay | Admitting: Family Medicine

## 2021-08-03 ENCOUNTER — Other Ambulatory Visit: Payer: Self-pay

## 2021-08-03 ENCOUNTER — Ambulatory Visit (INDEPENDENT_AMBULATORY_CARE_PROVIDER_SITE_OTHER): Payer: Medicaid Other | Admitting: Family Medicine

## 2021-08-03 VITALS — BP 123/81 | HR 81 | Wt 178.6 lb

## 2021-08-03 DIAGNOSIS — L309 Dermatitis, unspecified: Secondary | ICD-10-CM

## 2021-08-03 DIAGNOSIS — G43809 Other migraine, not intractable, without status migrainosus: Secondary | ICD-10-CM | POA: Diagnosis present

## 2021-08-03 DIAGNOSIS — F419 Anxiety disorder, unspecified: Secondary | ICD-10-CM | POA: Diagnosis not present

## 2021-08-03 DIAGNOSIS — F3132 Bipolar disorder, current episode depressed, moderate: Secondary | ICD-10-CM | POA: Diagnosis not present

## 2021-08-03 DIAGNOSIS — G43719 Chronic migraine without aura, intractable, without status migrainosus: Secondary | ICD-10-CM | POA: Diagnosis not present

## 2021-08-03 MED ORDER — LORATADINE 10 MG PO TABS: 10.0000 mg | ORAL_TABLET | Freq: Two times a day (BID) | ORAL | 0 refills | Status: DC

## 2021-08-03 MED ORDER — HYDROCORTISONE 2.5 % EX CREA: TOPICAL_CREAM | Freq: Two times a day (BID) | CUTANEOUS | 0 refills | Status: AC

## 2021-08-03 MED ORDER — TOPIRAMATE 25 MG PO TABS: ORAL_TABLET | ORAL | 0 refills | Status: DC

## 2021-08-03 MED ORDER — SUMATRIPTAN SUCCINATE 50 MG PO TABS: ORAL_TABLET | ORAL | 0 refills | Status: DC

## 2021-08-03 MED ORDER — RIZATRIPTAN BENZOATE 10 MG PO TABS: ORAL_TABLET | ORAL | 0 refills | Status: DC

## 2021-08-03 MED ORDER — HYDROXYZINE HCL 25 MG PO TABS: 25.0000 mg | ORAL_TABLET | Freq: Three times a day (TID) | ORAL | 0 refills | Status: DC | PRN

## 2021-08-03 NOTE — Assessment & Plan Note (Addendum)
History of migraines followed by neurology.  Current headache which appears mild, neurologically intact.  Restarted topiramate and sumatriptan.  Advised to follow-up with neurologist, contact information given.

## 2021-08-03 NOTE — Patient Instructions (Addendum)
It was nice seeing you today!  I have refilled your migraine medications.  Take hydroxyzine three times a day as needed for anxiety.  [facility]: [phone] [address], [place], [place] [address]  [facility] clinic: [phone]  Stay well, Littie Deeds, MD [facility] [phone]

## 2021-08-03 NOTE — Progress Notes (Signed)
    SUBJECTIVE:   CHIEF COMPLAINT / HPI: med refill  Patient is requesting medication refills for anxiety, migraines, eczema, and allergies.  Migraines Patient has a history of migraines.  She reports current right-sided headache associated with photophobia.  No nausea or vomiting.  Headaches typically last the entire day and has been occurring several days a week.  She was previously seen a neurologist at [facility], started on topiramate and rizatriptan.  Medications were discontinued due to her recent pregnancy.  She is no longer breast-feeding.  She thinks she may have had nausea with rizatriptan so she would prefer to have sumatriptan which she had while in the hospital.  Anxiety, bipolar disorder She was prescribed alprazolam 0.5 twice daily as needed by OB/GYN for anxiety 1 month ago.  She is no longer followed there and is requesting refill for medication.  She has been going to integrated BH.  She has a history of bipolar disorder.  She has already been referred to psychiatry and has an appointment in 2 months.  She is amenable to trying a different medication.  PERTINENT  PMH / PSH: GERD, anxiety, eczema, bipolar disorder  OBJECTIVE:   BP 123/81   Pulse 81   Wt 178 lb 9.6 oz (81 kg)   LMP 12/21/2019   SpO2 100%   BMI 27.97 kg/m   General: Alert, NAD CV: RRR, no murmurs Pulm: CTAB, no wheezes or rales Neuro: CN II through XII intact, full strength in all extremities, gross sensation intact Psych: Affect appropriate  Depression screen PHQ 2/9 08/03/2021  Decreased Interest 3  Down, Depressed, Hopeless 2  PHQ - 2 Score 5  Altered sleeping 3  Tired, decreased energy 3  Change in appetite 3  Feeling bad or failure about yourself  3  Trouble concentrating 3  Moving slowly or fidgety/restless 0  Suicidal thoughts 0  PHQ-9 Score 20  Difficult doing work/chores Extremely dIfficult  Some recent data might be hidden     ASSESSMENT/PLAN:   Eczema Hydrocortisone cream  refilled  Migraine with aura and with status migrainosus, not intractable History of migraines followed by neurology.  Current headache which appears mild, neurologically intact.  Restarted topiramate and sumatriptan.  Advised to follow-up with neurologist, contact information given.  Bipolar disorder, current episode depressed, moderate (HCC) Not currently on medications other than alprazolam.  We will switch alprazolam to hydroxyzine 25 mg 3 times daily as needed.  This can be increased if needed.  She has psychiatry upcoming appointment.     Littie Deeds, MD [facility]

## 2021-08-03 NOTE — Assessment & Plan Note (Signed)
Not currently on medications other than alprazolam.  We will switch alprazolam to hydroxyzine 25 mg 3 times daily as needed.  This can be increased if needed.  She has psychiatry upcoming appointment.

## 2021-08-03 NOTE — Assessment & Plan Note (Signed)
Hydrocortisone cream refilled.  

## 2021-08-09 NOTE — Telephone Encounter (Signed)
Spoke with patient about note below. She stated that whoever refilled it for her last time. Told her to go to her PCP to get it refilled, but she stated that she is not going to worry about it at this point. Aquilla Solian, CMA

## 2021-08-16 ENCOUNTER — Other Ambulatory Visit: Payer: Self-pay | Admitting: Family Medicine

## 2021-08-16 DIAGNOSIS — Z3201 Encounter for pregnancy test, result positive: Secondary | ICD-10-CM

## 2021-08-18 ENCOUNTER — Encounter (HOSPITAL_COMMUNITY): Payer: Self-pay | Admitting: Emergency Medicine

## 2021-08-18 ENCOUNTER — Other Ambulatory Visit: Payer: Self-pay

## 2021-08-18 ENCOUNTER — Observation Stay (HOSPITAL_COMMUNITY)
Admission: EM | Admit: 2021-08-18 | Discharge: 2021-08-20 | Disposition: A | Discharge status: Home / Self Care | Payer: Medicaid Other | Attending: Family Medicine | Admitting: Family Medicine

## 2021-08-18 DIAGNOSIS — R079 Chest pain, unspecified: Secondary | ICD-10-CM | POA: Diagnosis not present

## 2021-08-18 DIAGNOSIS — R0789 Other chest pain: Principal | ICD-10-CM | POA: Insufficient documentation

## 2021-08-18 DIAGNOSIS — M6208 Separation of muscle (nontraumatic), other site: Secondary | ICD-10-CM

## 2021-08-18 DIAGNOSIS — K760 Fatty (change of) liver, not elsewhere classified: Secondary | ICD-10-CM | POA: Diagnosis not present

## 2021-08-18 DIAGNOSIS — K5790 Diverticulosis of intestine, part unspecified, without perforation or abscess without bleeding: Secondary | ICD-10-CM

## 2021-08-18 DIAGNOSIS — D649 Anemia, unspecified: Secondary | ICD-10-CM | POA: Diagnosis not present

## 2021-08-18 DIAGNOSIS — K5900 Constipation, unspecified: Secondary | ICD-10-CM | POA: Diagnosis not present

## 2021-08-18 DIAGNOSIS — K5730 Diverticulosis of large intestine without perforation or abscess without bleeding: Secondary | ICD-10-CM | POA: Diagnosis not present

## 2021-08-18 DIAGNOSIS — K429 Umbilical hernia without obstruction or gangrene: Secondary | ICD-10-CM

## 2021-08-18 DIAGNOSIS — I517 Cardiomegaly: Secondary | ICD-10-CM | POA: Diagnosis not present

## 2021-08-18 DIAGNOSIS — Z20822 Contact with and (suspected) exposure to covid-19: Secondary | ICD-10-CM | POA: Insufficient documentation

## 2021-08-18 DIAGNOSIS — Q7649 Other congenital malformations of spine, not associated with scoliosis: Secondary | ICD-10-CM

## 2021-08-18 DIAGNOSIS — R160 Hepatomegaly, not elsewhere classified: Secondary | ICD-10-CM | POA: Diagnosis not present

## 2021-08-18 DIAGNOSIS — J9811 Atelectasis: Secondary | ICD-10-CM | POA: Diagnosis not present

## 2021-08-18 MED ORDER — ALUM & MAG HYDROXIDE-SIMETH 200-200-20 MG/5ML PO SUSP
30.0000 mL | Freq: Once | ORAL | Status: AC
  Administered 2021-08-19: 30 mL via ORAL
  Filled 2021-08-18: qty 30

## 2021-08-18 NOTE — ED Provider Notes (Signed)
Emergency Medicine Provider Triage Evaluation Note  Sara Manning , a 35 y.o. female  was evaluated in triage.  Pt complains of chest pain that started this afternoon at 7pm.  Mid chest pain radiates into the back.  No smoking.  No hormones, no leg swelling.  No trauma.  No hemoptysis.  She reports when she takes a quick breath it hurts more.  No N/V/D.  No fevers, no known sick contacts.   Review of Systems  Positive: Chest pain.   Negative: N/V/D.   Physical Exam  BP (!) 126/99 (BP Location: Right Arm)   Pulse (!) 102   Temp 98.6 F (37 C)   Resp 18   Ht 5\' 7"  (1.702 m)   Wt 85 kg   LMP 12/21/2019   SpO2 99%   BMI 29.35 kg/m  Gen:   Awake, appears uncomfortable.   Resp:  Normal effort  MSK:   Moves extremities without difficulty  Other:  RRRno MGR, Abdomen mildly TTP but different than reported pain  Medical Decision Making  Medically screening exam initiated at 11:49 PM.  Appropriate orders placed.  02/20/2020 Sara Manning was informed that the remainder of the evaluation will be completed by another provider, this initial triage assessment does not replace that evaluation, and the importance of remaining in the ED until their evaluation is complete.  Will order labs, CXR, EKG, trail GI cocktail.    Cyndia Skeeters, PA-C 08/19/21 0001    Mesner, 14/03/22, MD 08/19/21 [phone]

## 2021-08-18 NOTE — ED Triage Notes (Signed)
Patient reports mid chest pain radiating to left upper back worse with deep inspiration and changing positions onset this evening , mild SOB , no emesis or diaphoresis , denies cough or fever .

## 2021-08-19 ENCOUNTER — Encounter (HOSPITAL_COMMUNITY): Payer: Self-pay | Admitting: Student

## 2021-08-19 ENCOUNTER — Observation Stay (HOSPITAL_COMMUNITY): Payer: Medicaid Other

## 2021-08-19 ENCOUNTER — Observation Stay (HOSPITAL_BASED_OUTPATIENT_CLINIC_OR_DEPARTMENT_OTHER): Payer: Medicaid Other

## 2021-08-19 ENCOUNTER — Emergency Department (HOSPITAL_COMMUNITY): Payer: Medicaid Other

## 2021-08-19 DIAGNOSIS — K429 Umbilical hernia without obstruction or gangrene: Secondary | ICD-10-CM

## 2021-08-19 DIAGNOSIS — R079 Chest pain, unspecified: Secondary | ICD-10-CM | POA: Diagnosis present

## 2021-08-19 DIAGNOSIS — Q7649 Other congenital malformations of spine, not associated with scoliosis: Secondary | ICD-10-CM

## 2021-08-19 DIAGNOSIS — K5900 Constipation, unspecified: Secondary | ICD-10-CM | POA: Diagnosis not present

## 2021-08-19 DIAGNOSIS — K5730 Diverticulosis of large intestine without perforation or abscess without bleeding: Secondary | ICD-10-CM | POA: Diagnosis not present

## 2021-08-19 DIAGNOSIS — J9811 Atelectasis: Secondary | ICD-10-CM | POA: Diagnosis not present

## 2021-08-19 DIAGNOSIS — M6208 Separation of muscle (nontraumatic), other site: Secondary | ICD-10-CM

## 2021-08-19 DIAGNOSIS — K760 Fatty (change of) liver, not elsewhere classified: Secondary | ICD-10-CM

## 2021-08-19 DIAGNOSIS — I517 Cardiomegaly: Secondary | ICD-10-CM | POA: Diagnosis not present

## 2021-08-19 DIAGNOSIS — K5790 Diverticulosis of intestine, part unspecified, without perforation or abscess without bleeding: Secondary | ICD-10-CM

## 2021-08-19 LAB — CBC WITH DIFFERENTIAL/PLATELET
Abs Immature Granulocytes: 0.01 10*3/uL (ref 0.00–0.07)
Basophils Absolute: 0 10*3/uL (ref 0.0–0.1)
Basophils Relative: 1 %
Eosinophils Absolute: 0.1 10*3/uL (ref 0.0–0.5)
Eosinophils Relative: 1 %
HCT: 37.4 % (ref 36.0–46.0)
Hemoglobin: 11.6 g/dL — ABNORMAL LOW (ref 12.0–15.0)
Immature Granulocytes: 0 %
Lymphocytes Relative: 27 %
Lymphs Abs: 1.9 10*3/uL (ref 0.7–4.0)
MCH: 24.9 pg — ABNORMAL LOW (ref 26.0–34.0)
MCHC: 31 g/dL (ref 30.0–36.0)
MCV: 80.3 fL (ref 80.0–100.0)
Monocytes Absolute: 0.5 10*3/uL (ref 0.1–1.0)
Monocytes Relative: 7 %
Neutro Abs: 4.5 10*3/uL (ref 1.7–7.7)
Neutrophils Relative %: 64 %
Platelets: 459 10*3/uL — ABNORMAL HIGH (ref 150–400)
RBC: 4.66 MIL/uL (ref 3.87–5.11)
RDW: 14.7 % (ref 11.5–15.5)
WBC: 7 10*3/uL (ref 4.0–10.5)
nRBC: 0 % (ref 0.0–0.2)

## 2021-08-19 LAB — COMPREHENSIVE METABOLIC PANEL
ALT: 31 U/L (ref 0–44)
AST: 28 U/L (ref 15–41)
Albumin: 3.6 g/dL (ref 3.5–5.0)
Alkaline Phosphatase: 118 U/L (ref 38–126)
Anion gap: 7 (ref 5–15)
BUN: 14 mg/dL (ref 6–20)
CO2: 27 mmol/L (ref 22–32)
Calcium: 8.9 mg/dL (ref 8.9–10.3)
Chloride: 103 mmol/L (ref 98–111)
Creatinine, Ser: 0.85 mg/dL (ref 0.44–1.00)
GFR, Estimated: >60 mL/min (ref >60)
Glucose, Bld: 94 mg/dL (ref 70–99)
Potassium: 3.6 mmol/L (ref 3.5–5.1)
Sodium: 137 mmol/L (ref 135–145)
Total Bilirubin: 0.3 mg/dL (ref 0.3–1.2)
Total Protein: 7.3 g/dL (ref 6.5–8.1)

## 2021-08-19 LAB — LIPASE, BLOOD: Lipase: 27 U/L (ref 11–51)

## 2021-08-19 LAB — ECHOCARDIOGRAM COMPLETE
Area-P 1/2: 2.77 cm2
Height: 67 in
S' Lateral: 2.5 cm
Weight: 2998.26 oz

## 2021-08-19 LAB — I-STAT BETA HCG BLOOD, ED (MC, WL, AP ONLY): I-stat hCG, quantitative: <5 m[IU]/mL (ref <5)

## 2021-08-19 LAB — TROPONIN I (HIGH SENSITIVITY)
Troponin I (High Sensitivity): <2 ng/L (ref <18)
Troponin I (High Sensitivity): <2 ng/L (ref <18)
Troponin I (High Sensitivity): 2 ng/L (ref <18)
Troponin I (High Sensitivity): <2 ng/L (ref <18)

## 2021-08-19 LAB — RESP PANEL BY RT-PCR (FLU A&B, COVID) ARPGX2
Influenza A by PCR: NEGATIVE
Influenza B by PCR: NEGATIVE
SARS Coronavirus 2 by RT PCR: NEGATIVE

## 2021-08-19 LAB — BRAIN NATRIURETIC PEPTIDE: B Natriuretic Peptide: 3 pg/mL (ref 0.0–100.0)

## 2021-08-19 MED ORDER — LIDOCAINE VISCOUS HCL 2 % MT SOLN
15.0000 mL | Freq: Once | OROMUCOSAL | Status: AC
  Administered 2021-08-19: 15 mL via ORAL
  Filled 2021-08-19: qty 15

## 2021-08-19 MED ORDER — FENTANYL CITRATE PF 50 MCG/ML IJ SOSY
100.0000 ug | PREFILLED_SYRINGE | Freq: Once | INTRAMUSCULAR | Status: AC
  Administered 2021-08-19: 100 ug via INTRAVENOUS
  Filled 2021-08-19: qty 2

## 2021-08-19 MED ORDER — ENOXAPARIN SODIUM 40 MG/0.4ML IJ SOSY
40.0000 mg | PREFILLED_SYRINGE | Freq: Every day | INTRAMUSCULAR | Status: DC
  Administered 2021-08-19 – 2021-08-20 (×2): 40 mg via SUBCUTANEOUS
  Filled 2021-08-19 (×2): qty 0.4

## 2021-08-19 MED ORDER — SODIUM CHLORIDE 0.9% FLUSH: 3.0000 mL | INTRAVENOUS | Status: DC | PRN

## 2021-08-19 MED ORDER — TOPIRAMATE 25 MG PO TABS
75.0000 mg | ORAL_TABLET | Freq: Every day | ORAL | Status: DC
  Administered 2021-08-19: 75 mg via ORAL
  Filled 2021-08-19: qty 3

## 2021-08-19 MED ORDER — OXYCODONE-ACETAMINOPHEN 5-325 MG PO TABS
2.0000 | ORAL_TABLET | Freq: Once | ORAL | Status: AC
  Administered 2021-08-19: 2 via ORAL
  Filled 2021-08-19: qty 2

## 2021-08-19 MED ORDER — HYDROCORTISONE 2.5 % EX CREA: 1.0000 "application " | TOPICAL_CREAM | Freq: Every day | CUTANEOUS | Status: DC | PRN

## 2021-08-19 MED ORDER — SODIUM CHLORIDE 0.9 % IV SOLN: 250.0000 mL | INTRAVENOUS | Status: DC | PRN

## 2021-08-19 MED ORDER — HYDROMORPHONE HCL 1 MG/ML IJ SOLN
1.0000 mg | Freq: Once | INTRAMUSCULAR | Status: AC
  Administered 2021-08-19: 1 mg via INTRAVENOUS
  Filled 2021-08-19: qty 1

## 2021-08-19 MED ORDER — HYDROXYZINE HCL 25 MG PO TABS
25.0000 mg | ORAL_TABLET | Freq: Three times a day (TID) | ORAL | Status: DC | PRN
  Administered 2021-08-19: 25 mg via ORAL
  Filled 2021-08-19: qty 1

## 2021-08-19 MED ORDER — SODIUM CHLORIDE 0.9% FLUSH
3.0000 mL | Freq: Two times a day (BID) | INTRAVENOUS | Status: DC
  Administered 2021-08-19 – 2021-08-20 (×3): 3 mL via INTRAVENOUS

## 2021-08-19 MED ORDER — FAMOTIDINE 20 MG PO TABS
20.0000 mg | ORAL_TABLET | Freq: Every day | ORAL | Status: DC
  Administered 2021-08-19 – 2021-08-20 (×2): 20 mg via ORAL
  Filled 2021-08-19 (×2): qty 1

## 2021-08-19 MED ORDER — ALUM & MAG HYDROXIDE-SIMETH 200-200-20 MG/5ML PO SUSP
30.0000 mL | Freq: Once | ORAL | Status: AC
  Administered 2021-08-19: 30 mL via ORAL
  Filled 2021-08-19: qty 30

## 2021-08-19 MED ORDER — IOHEXOL 350 MG/ML SOLN
80.0000 mL | Freq: Once | INTRAVENOUS | Status: AC | PRN
  Administered 2021-08-19: 80 mL via INTRAVENOUS

## 2021-08-19 MED ORDER — LIDOCAINE 5 % EX PTCH
1.0000 | MEDICATED_PATCH | CUTANEOUS | Status: DC
  Administered 2021-08-19: 1 via TRANSDERMAL
  Filled 2021-08-19: qty 1

## 2021-08-19 MED ORDER — IOHEXOL 350 MG/ML SOLN
100.0000 mL | Freq: Once | INTRAVENOUS | Status: AC | PRN
  Administered 2021-08-19: 100 mL via INTRAVENOUS

## 2021-08-19 MED ORDER — POLYETHYLENE GLYCOL 3350 17 G PO PACK
17.0000 g | PACK | Freq: Every day | ORAL | Status: DC
  Administered 2021-08-20: 11:00:00 17 g via ORAL
  Filled 2021-08-19: qty 1

## 2021-08-19 MED ORDER — HYDROCORTISONE 1 % EX CREA
1.0000 "application " | TOPICAL_CREAM | Freq: Every day | CUTANEOUS | Status: DC | PRN
  Filled 2021-08-19: qty 28

## 2021-08-19 MED ORDER — SENNA 8.6 MG PO TABS: 1.0000 | ORAL_TABLET | Freq: Every day | ORAL | Status: DC | PRN

## 2021-08-19 MED ORDER — MELOXICAM 7.5 MG PO TABS
7.5000 mg | ORAL_TABLET | Freq: Once | ORAL | Status: AC
  Administered 2021-08-19: 7.5 mg via ORAL
  Filled 2021-08-19: qty 1

## 2021-08-19 MED ORDER — KETOROLAC TROMETHAMINE 30 MG/ML IJ SOLN
30.0000 mg | Freq: Once | INTRAMUSCULAR | Status: AC
  Administered 2021-08-19: 30 mg via INTRAVENOUS
  Filled 2021-08-19: qty 1

## 2021-08-19 MED ORDER — SIMETHICONE 80 MG PO CHEW
80.0000 mg | CHEWABLE_TABLET | Freq: Once | ORAL | Status: AC
  Administered 2021-08-19: 80 mg via ORAL
  Filled 2021-08-19: qty 1

## 2021-08-19 MED ORDER — LIDOCAINE VISCOUS HCL 2 % MT SOLN
15.0000 mL | Freq: Once | OROMUCOSAL | Status: DC
  Filled 2021-08-19: qty 15

## 2021-08-19 NOTE — H&P (Addendum)
[facility] Admission History and Physical Service Pager: [phone]  Patient name: Sara Manning Medical record number: [id number] Date of birth: [birth year] Age: 35 y.o. Gender: female  Primary Care Provider: Rise Patience, DO Consultants: None Code Status: Full Preferred Emergency Contact: Sara Manning [phone] (significant other)  Chief Complaint: tearing chest pain  Assessment and Plan: Sara PRENTISS is a 35 y.o. female presenting with tearing chest pain. PMH is significant for BPD, GERD, eczema, migraine w/aura + status migrainosus.  Chest pain Patient presents to ED with tearing chest pain. VSS, SpO2 normal on room air. Initial ED labs unremarkable. Troponins negative x2.  Lipase 27.  EKG showed sinus tachycardia.  CXR showed mild cardiomegaly. CTA dissection study of chest/abdomen/pelvis negative for dissection but did demonstrate posterior atelectasis and mild cardiomegaly (w/o evidence R heart strain) with incidental findings of hepatic steatosis, constipation, and congenital vertebral anomalies.  In ED, patient received Maalox at midnight, fentanyl 100 mcg at 0200, Dilaudid 1 mg & Toradol 30 mg IV & simethicone 80 mg 0530, Percocet 2 tablets 0633, and meloxicam 7.5 mg p.o. 2500 without significant relief. Patient reports pain worse with inspiration and laying flat.  Recent psychosocial stressors include newborn twins (now 2 mo), new cancer diagnosis in her 26 yo daughter, moving to temporary housing location in October. On initial exam, patient appears to be in mild distress but speaking in full sentences and nontoxic-appearing.  Appears euvolemic.  Differential diagnosis includes aortic dissection, MI, pancreatitis, GERD, PE, costochondritis.  Given continued severe pain despite treatment per above, echo finding, ordered CTA chest for PE assessment. -Admitted to med telemetry under attending Dr. Thompson Grayer -Diet: Regular -Vital signs per  routine -Consider KUB -Follow-up echo -Follow-up hemoglobin A1c -Follow-up Mag, TSH -AM CBC, CMP -Famotidine 20 mg daily -Lidoderm daily  Recent delivery of twins (9/30), delivered [redacted]w[redacted]d  Patient is a [id number] who delivered twins at [redacted]w[redacted]d on 06/14/21. Induced at 37 weeks for twin gestation with 6% discordance.  Delivery complicated with hemorrhage with 1 L blood loss, requiring AROM, Pitocin, and postpartum blood transfusion. Home meds - prenatal vitamin.   Bipolar 2 disorder Integrated [facility] phone note 07/25/21 indicates patient was requesting refills of xanax for panic attacks and ambien for sleep. PDMP review (below) indicates prescription of #80 tabs xanax 0.5 mg since 06/24/21 (avg 10 tabs / week). Xanax switched to hydroxyzine 25 mg TID PRN by [facility] provider at office visit 08/03/21. Last ambien fill 02/13/21. Referred to [facility], has appointment scheduled for 09/25/21.  Consider SSRI or mood stabilizer after discharge. - Continue Hydroxyzine 25 mg 3 times daily     Constipation Demonstrated on imaging in ED.  - miralax daily - senna daily PRN  Migraine with aura  Hx status migrainosus Home meds - Topamax qHS, sumatriptan 50 mg PRN -Continue Topamax 25 mg qHS  GERD No home meds in chart.  -Famotidine 20 mg daily  Eczema  Seasonal allergies Home meds - hydrocortisone cream 2.5%, loratadine 10 mg.  -Hydrocortisone cream daily as needed  Mild normocytic anemia Hb 11.6, MCV 80.3.  Of note (9/29), path review showed normocytic anemia with anisopoikilocytosis including schistocytes.  - daily CBC  Hepatomegaly CT angio showed 20 cm length mildly steatotic liver.  - LDL - PCP to follow outpatient  FEN/GI: Regular Prophylaxis: Lovenox  Disposition: Med tele  History of Present Illness:  Sara Manning is a 35 y.o. female presenting with tearing CP x1 day.  Reported new onset, sudden, tearing L-sided chest  pain that radiates through her back. Reported that it came on at rest  around 6am. Pain worsens with inhalation/exhalation, laying flat and on the left side. She came in because the pain was worsening and not resolving. States that pain is partially relieved with pain meds given in the ED. Reports that the CP is constant but intensity waxes and wanes. It is also tender "on the inside" to palpation. Also has intermittent epigastric pain that is sharp in nature and tender to palpation. Was nauseas and vomited water in ED, "think because my stomach is empty". Last time ate was 5am and nothing else all day. Reported hx of GERD during pregnancy, but none recently and takes no medicines for it. Reports dizziness when standing.   Vaginal delivery of twins (2 months ago) was reportedly complicated by blood loss requiring blood transfusion. After delivery she has been active and ambulating well since birth.  Denied sick contacts, being sedentary, recent travels. Denied hx of MI, clots, loss of appetite.  Review Of Systems: Per HPI with the following additions:  Review of Systems  Constitutional:  Negative for appetite change, chills, fatigue and fever.  HENT:  Negative for congestion and rhinorrhea.   Respiratory:  Positive for chest tightness. Negative for wheezing.   Cardiovascular:  Positive for chest pain.  Gastrointestinal:  Positive for abdominal pain (epigastric), nausea and vomiting. Negative for abdominal distention and diarrhea.  Genitourinary:  Negative for difficulty urinating, dysuria and frequency.  Musculoskeletal:  Negative for gait problem and joint swelling.  Skin:  Negative for rash.  Neurological:  Positive for light-headedness and headaches. Negative for weakness.  Psychiatric/Behavioral:  Negative for agitation and confusion.     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   Headache 04/25/2020   Migraine with aura and with status migrainosus, not intractable 11/06/2019   Eczema 12/01/2015   Bipolar disorder, current episode depressed, moderate ([place]) 06/22/2011   Gastroesophageal reflux disease 04/30/2011   Past Medical History: Past Medical History:  Diagnosis Date   Bipolar 2 disorder ([place])    Headache(784.0)    Mood change 11/06/2019   Past Surgical History: Past Surgical History:  Procedure Laterality Date   WISDOM TOOTH EXTRACTION     Social History: Social History   Tobacco Use   Smoking status: Never   Smokeless tobacco: Never  Vaping Use   Vaping Use: Never used  Substance Use Topics   Alcohol use: No   Drug use: No   Additional social history: Never uses tobacco, EtOH, or any other recreational substances. Denies OTC supplements. Lives with boyfriend, 31yo daughter, and 46mo twins. Independent in ADLs/IADLs.    Please also refer to relevant sections of EMR.  Family History: Family History  Problem Relation Age of Onset   Bipolar disorder Mother    Diabetes Mother    Bipolar disorder Sister    Schizophrenia Sister   FHx: Grandmother had "really bad strokes" Diabetes runs in the family Denied family hx of cardiac issues Hodgkin lymphoma cancer in 29yo daughter currently  Allergies and Medications: No Known Allergies No current facility-administered medications on file prior to encounter.   Current Outpatient Medications on File Prior to Encounter  Medication Sig Dispense Refill   hydrocortisone 2.5 % cream Apply topically 2 (two)  times daily. (Patient taking differently: Apply 1 application topically daily as needed (For eczma).) 40 g 0   Prenatal Vit-Fe Fumarate-FA ([employer] PRENATAL) 27-0.8 MG TABS TAKE 1 TABLET BY MOUTH EVERY DAY (Patient taking differently: Take 1 tablet by mouth daily.) 30 tablet 1   Blood Pressure KIT Use weekly 1 kit 0   hydrOXYzine  (ATARAX/VISTARIL) 25 MG tablet Take 1 tablet (25 mg total) by mouth 3 (three) times daily as needed. (Patient not taking: Reported on 08/19/2021) 90 tablet 0   loratadine (CLARITIN) 10 MG tablet Take 1 tablet (10 mg total) by mouth 2 (two) times daily. Can decrease to one pill daily if itching/rash improves. Stay on daily until at least a month postpartum. (Patient not taking: Reported on 08/19/2021) 60 tablet 0   SUMAtriptan (IMITREX) 50 MG tablet Take 1 tablet once as needed for headache. May repeat in 2 hours if headache persists or recurs. (Patient not taking: Reported on 08/19/2021) 10 tablet 0   topiramate (TOPAMAX) 25 MG tablet Take one tablet at bedtime for one week, then two tablets at bedtime for one week, then three tablets at bedtime (Patient not taking: Reported on 08/19/2021) 90 tablet 0    Objective: BP 125/79 (BP Location: Right Arm)   Pulse 80   Temp 98.4 F (36.9 C) (Oral)   Resp 16   Ht $R'5\' 7"'Np$  (1.702 m)   Wt 85 kg   LMP 12/21/2019   SpO2 98%   BMI 29.35 kg/m  Physical Exam Vitals and nursing note reviewed.  Constitutional:      General: She is in acute distress.     Appearance: She is not ill-appearing or diaphoretic.  HENT:     Head: Normocephalic and atraumatic.  Cardiovascular:     Rate and Rhythm: Normal rate and regular rhythm. No extrasystoles are present.    Chest Wall: PMI is not displaced.     Heart sounds: Normal heart sounds. No murmur heard.   No friction rub. No gallop.  Pulmonary:     Effort: Pulmonary effort is normal. No tachypnea, accessory muscle usage or respiratory distress.     Breath sounds: Normal breath sounds. No stridor. No decreased breath sounds, wheezing, rhonchi or rales.  Chest:     Chest wall: Tenderness (Left-sided over msk near sternum and under breast) present. No mass, deformity or crepitus.  Abdominal:     General: There is no distension.     Palpations: Abdomen is soft.     Tenderness: There is abdominal tenderness  (Epigastric). There is no guarding or rebound.  Musculoskeletal:     Right lower leg: No tenderness. No edema.     Left lower leg: No tenderness. No edema.  Skin:    General: Skin is warm and dry.  Neurological:     Mental Status: She is alert and oriented to person, place, and time. Mental status is at baseline.  Psychiatric:        Mood and Affect: Mood is anxious.   Labs and Imaging: CBC BMET  Recent Labs  Lab 08/19/21 0016  WBC 7.0  HGB 11.6*  HCT 37.4  PLT 459*   Recent Labs  Lab 08/19/21 0016  NA 137  K 3.6  CL 103  CO2 27  BUN 14  CREATININE 0.85  GLUCOSE 94  CALCIUM 8.9     EKG: sinus tachycardia to 115 bpm, Qtc 455, no ST elevation  DG Chest 2 View  Result Date: 08/19/2021 CLINICAL DATA:  Chest pain  EXAM: CHEST - 2 VIEW COMPARISON:  None. FINDINGS: The heart size and mediastinal contours are within normal limits. Both lungs are clear. Unchanged T8 height loss. IMPRESSION: No active cardiopulmonary disease. Electronically Signed   By: Ulyses Jarred M.D.   On: 08/19/2021 00:26   ECHOCARDIOGRAM COMPLETE  Result Date: 08/19/2021    ECHOCARDIOGRAM REPORT   Patient Name:   Sara Manning Date of Exam: 08/19/2021 Medical Rec #:  [id number]       Height:       67.0 in Accession #:    [id number]      Weight:       187.4 lb Date of Birth:  1986/07/06       BSA:          1.967 m Patient Age:    35 years        BP:           104/71 mmHg Patient Gender: F               HR:           81 bpm. Exam Location:  Inpatient Procedure: 2D Echo, Color Doppler and Cardiac Doppler Indications:    R07.9* Chest pain, unspecified  History:        Patient has no prior history of Echocardiogram examinations. 2                 months post partum (twins).  Sonographer:    Raquel Sarna Senior RDCS Referring Phys: [id number] JASON MESNER  Sonographer Comments: Exam performed upright due to dyspnea IMPRESSIONS  1. Left ventricular ejection fraction, by estimation, is 60 to 65%. The left ventricle has normal  function. The left ventricle has no regional wall motion abnormalities. Left ventricular diastolic parameters were normal.  2. Right ventricular systolic function is normal. The right ventricular size is mildly enlarged.  3. A small pericardial effusion is present.  4. The mitral valve is normal in structure. No evidence of mitral valve regurgitation.  5. The aortic valve is normal in structure. Aortic valve regurgitation is not visualized.  6. The inferior vena cava is normal in size with greater than 50% respiratory variability, suggesting right atrial pressure of 3 mmHg. Comparison(s): No prior Echocardiogram. Conclusion(s)/Recommendation(s): RV looks mildly dilated and there is septal flattening. Would suggest CT PE protocol to assess for PE. FINDINGS  Left Ventricle: Left ventricular ejection fraction, by estimation, is 60 to 65%. The left ventricle has normal function. The left ventricle has no regional wall motion abnormalities. The left ventricular internal cavity size was normal in size. There is  no left ventricular hypertrophy. Left ventricular diastolic parameters were normal. Right Ventricle: The right ventricular size is mildly enlarged. No increase in right ventricular wall thickness. Right ventricular systolic function is normal. Left Atrium: Left atrial size was normal in size. Right Atrium: Right atrial size was normal in size. Pericardium: A small pericardial effusion is present. Mitral Valve: The mitral valve is normal in structure. No evidence of mitral valve regurgitation. Tricuspid Valve: The tricuspid valve is normal in structure. Tricuspid valve regurgitation is not demonstrated. Aortic Valve: The aortic valve is normal in structure. Aortic valve regurgitation is not visualized. Pulmonic Valve: The pulmonic valve was normal in structure. Pulmonic valve regurgitation is not visualized. Aorta: The aortic root and ascending aorta are structurally normal, with no evidence of dilitation. Venous:  The inferior vena cava is normal in size with greater than 50% respiratory variability, suggesting right atrial pressure  of 3 mmHg. IAS/Shunts: The interatrial septum was not well visualized.  LEFT VENTRICLE PLAX 2D LVIDd:         4.00 cm   Diastology LVIDs:         2.50 cm   LV e' medial:    8.59 cm/s LV PW:         0.80 cm   LV E/e' medial:  8.1 LV IVS:        0.80 cm   LV e' lateral:   11.70 cm/s LVOT diam:     2.00 cm   LV E/e' lateral: 5.9 LV SV:         58 LV SV Index:   29 LVOT Area:     3.14 cm  RIGHT VENTRICLE RV S prime:     13.20 cm/s TAPSE (M-mode): 1.8 cm LEFT ATRIUM             Index        RIGHT ATRIUM           Index LA diam:        2.80 cm 1.42 cm/m   RA Area:     12.50 cm LA Vol (A2C):   33.2 ml 16.88 ml/m  RA Volume:   28.00 ml  14.23 ml/m LA Vol (A4C):   16.1 ml 8.18 ml/m LA Biplane Vol: 24.9 ml 12.66 ml/m  AORTIC VALVE LVOT Vmax:   99.00 cm/s LVOT Vmean:  67.200 cm/s LVOT VTI:    0.184 m  AORTA Ao Root diam: 3.20 cm Ao Asc diam:  2.90 cm MITRAL VALVE MV Area (PHT): 2.77 cm    SHUNTS MV Decel Time: 274 msec    Systemic VTI:  0.18 m MV E velocity: 69.20 cm/s  Systemic Diam: 2.00 cm MV A velocity: 57.60 cm/s MV E/A ratio:  1.20 Landscape architect signed by Phineas Inches Signature Date/Time: 08/19/2021/3:20:19 PM    Final    CT Angio Chest/Abd/Pel for Dissection W and/or Wo Contrast  Addendum Date: 08/19/2021   ADDENDUM REPORT: 08/19/2021 04:19 ADDENDUM: Under the hepatobiliary section, the second sentence incorrectly states "Masses seen." This should have read "No mass is seen." Discussed with Dr. Dayna Barker. Electronically Signed   By: Telford Nab M.D.   On: 08/19/2021 04:19   Result Date: 08/19/2021 CLINICAL DATA:  Abdominal pain, aortic dissection suspected . EXAM: CT ANGIOGRAPHY CHEST, ABDOMEN AND PELVIS TECHNIQUE: Non-contrast CT of the chest was initially obtained. Multidetector CT imaging through the chest, abdomen and pelvis was performed using the standard protocol during  bolus administration of intravenous contrast. Multiplanar reconstructed images and MIPs were obtained and reviewed to evaluate the vascular anatomy. CONTRAST:  125mL OMNIPAQUE IOHEXOL 350 MG/ML SOLN COMPARISON:  PA and lateral chest today is the only relevant prior. FINDINGS: CTA CHEST FINDINGS Cardiovascular: Preferential opacification of the thoracic aorta. No evidence of thoracic aortic aneurysm or dissection. Mildly prominent heart size. No pericardial effusion. No significant central venous distension. The great vessels are normal. Mediastinum/Nodes: No enlarged mediastinal, hilar, or axillary lymph nodes. Thyroid gland, trachea, and esophagus demonstrate no significant findings. Lungs/Pleura: There is mild dependent atelectasis. No infiltrate or nodule. There are trace pleural effusions. No pneumothorax. 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 There is slight thoracic levoscoliosis. 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 Review of the MIP images confirms the above findings. CTA ABDOMEN AND PELVIS FINDINGS VASCULAR Aorta: Normal caliber  aorta without aneurysm, dissection, vasculitis or significant stenosis. Celiac: Patent without evidence of aneurysm, dissection, vasculitis or significant stenosis. SMA: Patent without evidence of aneurysm, dissection, vasculitis or significant stenosis. Renals: Both renal arteries are patent without evidence of aneurysm, dissection, vasculitis, fibromuscular dysplasia or significant stenosis. IMA: Patent without evidence of aneurysm, dissection, vasculitis or significant stenosis. Inflow: Patent without evidence of aneurysm, dissection, vasculitis or significant stenosis. Veins: No obvious venous abnormality within the limitations of this arterial phase study. Review of the MIP images confirms the above findings. NON-VASCULAR Hepatobiliary: 20 cm length  mildly steatotic liver. Masses seen. The gallbladder and bile ducts unremarkable. Pancreas: Unremarkable. No pancreatic ductal dilatation or surrounding inflammatory changes. Spleen: Normal in size without focal abnormality. Adrenals/Urinary Tract: There is no adrenal mass no focal abnormality of the renal cortex and no renal calculus or hydronephrosis. The bladder is unremarkable. Stomach/Bowel: The gastric wall is normal. The unopacified small bowel is unremarkable. There is a normal caliber appendix. Moderate fecal stasis is seen primarily ascending and transverse colon. Left colonic diverticula without evidence for colitis or diverticulitis Lymphatic: No enlarged lymph nodes are observed. Reproductive: The uterus is intact. The ovaries are follicular but not enlarged. Other: There is rectus diastasis at the umbilical level, small umbilical fat hernia. No incarcerated hernia. 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 Mild features of chronic bilateral sacroiliitis. Review of the MIP images confirms the above findings. IMPRESSION: 1. No aortic aneurysm, dissection or visible plaques. 2. Low inspiration with posterior atelectasis without evidence of infiltrate. 3. Mild cardiomegaly without venous distention, edema or findings of acute right heart strain. 4. Trace pleural effusions. 5. Mildly prominent liver with steatosis . 6. Constipation and diverticulosis. 7. Abdominal rectus diastasis and small umbilical fat hernia. 8. Congenital vertebral anomalies as above. Electronically Signed: By: Telford Nab M.D. On: 08/19/2021 03:43     Merrily Brittle, DO 08/19/2021, 8:29 PM PGY-2, [place] Intern pager: [phone], text pages welcome   Upper Level Addendum: I have seen and evaluated this patient along with Dr. Alfonse Spruce and reviewed the above note, making necessary revisions as appropriate. I agree with the medical decision making and physical exam as noted  above. Ezequiel Essex, MD PGY-2 [facility] Family Medicine Residency

## 2021-08-19 NOTE — ED Notes (Signed)
Second attempt for 5N report.

## 2021-08-19 NOTE — ED Notes (Signed)
Pt given ice.  

## 2021-08-19 NOTE — Progress Notes (Addendum)
Plan is to admit patient to FPTS. Formal H&P to come asap. Please page primary team with questions or concerns.   Per ED RN, 6E did not accept bed placement because of the impression this patient would be discharging soon. If the charge nurse is requesting a call with the MD team to contest admission, please use our pager [phone] and we will respond as soon as possible.   Fayette Pho, MD PGY-2, [facility] Family Medicine Service pager [phone]

## 2021-08-19 NOTE — Progress Notes (Signed)
Paged by RN for 10/10 crushing chest pain. Went to room, patient in bed, mild distress. Boyfriend at bedside. Patient reports 1.5 hours of crushing chest pain, with radiation to left shoulder. Clarifies it feels less radiating to back, more radiating to left shoulder. Quality similar to admission.   Patient reports trying to use the nurse call button for about an hour, but kept getting hung up on or the answering person would tell her that she'd send someone in, but no one ever came. Patient reports she used her cell phone to get a hold of her boyfriend to come to the hospital to go to the front desk to tell someone she was experiencing crushing chest pain. Will submit a safety zone report.   Gen: moderate distress, restless in bed Card: tachycardic to 110s, no murmur, regular rhythm, no diaphoresis Lungs: CTAB  A&P Wonder if etiology is PE. Have ordered CTA PE protocol about a half hour prior to this RN page.  - STAT EKG - STAT trops - Awaiting STAT CTA chest PE - GI cocktail with viscous lidocaine - percoset x1 - continue to monitor  Fayette Pho, MD PGY-2, [facility] Family Medicine Service pager [phone]

## 2021-08-19 NOTE — Progress Notes (Signed)
Sara Manning seen by myself this afternoon around 1500 in ED. She describes that last night after picking up her twins she noted severe substernal chest pain, which she describes as "tearing," that radiated to her back. Right now, she notes the pain as 4/10, worse with deep breath, and sore a little to touch. She notes she was just sitting when this came on but really noticed when she lifted her twins. She has not had anything like this before. She notes the medications she was given in ED helped some. She denies heartburn or burning pain. Denies preceeding cough, chills, rhinorrhea, shortness of breath. Denies leg swelling.   On exam, she is on room air, appears mildly uncomfortable. Heart RRR, no murmurs rubs or gallops. Lungs CTAB. Normal respiratory rate without distress. Mild tenderness of chest wall at one point on sternum, but not entirely the same pain she states. Also, a little worse with movement/twisting. Abd soft, non-tender.  Labs and imaging reviewed. ECG reviewed showing sinus tachycardia with ST segment changes or TWI.   A/P Chest Pain, pleuritic - ddx dissection vs PE vs chostochondritis - troponins negative, ECG showing sinus tachycardia - CTA chest/abd/pelvis without signs of dissection, PE not commented on but residents called radiology and the radiologist discussed that while this is not the optimal study for this can see pulmonary vasculature well and no PE noted - with stable vitals and this information did not pursue CTA chest this AM as she was not tachycardic or tachypnic  - CXR showed mild cardiomegaly, after rounding on patient noted ECHO read showing RV mildly dilated and with septal flattening, recommending CTA to assess for PE - due to this ECHO finding, continued chest pain, and suboptimal study for PE, CTA chest ordered stat  Update: received page [phone] of patient having severe chest pain. Called back [phone] and RN had spoke to resident team who ordered troponin/ECG and on  the way to evaluate patient. Will follow up on stat CTA chest that has not yet been done.

## 2021-08-19 NOTE — ED Notes (Signed)
Attempted report to 6E, accepting RN on lunch.

## 2021-08-19 NOTE — Hospital Course (Addendum)
Sara Manning is a 35 y.o. female presenting with Chest Pain. PMHx is significant for  has a past medical history of Bipolar 2 disorder (HCC), Headache(784.0), and Mood change (11/06/2019).   ED Course: VSS, SpO2 normal on room air. Initial ED labs unremarkable. Troponins negative x2. Lipase 27.  EKG showed sinus tachycardia.  CXR showed mild cardiomegaly. CTA dissection study of chest/abdomen/pelvis negative for dissection but did demonstrate posterior atelectasis and mild cardiomegaly (w/o evidence R heart strain) with incidental findings of hepatic steatosis, constipation, and congenital vertebral anomalies.  In ED, patient received Maalox at midnight, fentanyl 100 mcg at 0200, Dilaudid 1 mg & Toradol 30 mg IV & simethicone 80 mg 0530, Percocet 2 tablets 0633, and meloxicam 7.5 mg p.o. 9470 without relief.    Plan by Problem: Chest pain, non cardiac Patient presented to the ED with during chest pain.  Work up negative for ACS and PE.  Received opioids and atarax with good relief.  Reports no chest pain or shortness of breath on day of discharge.  Ferritin abnormally low at 9.  Started on Ferrous Sulfate.    Issues for Follow Up:  Referral: ensure BH follow up Preventive: follow up with PCP Labs: Repeat CBC in 3 months PE r/o Iron Deficiency Anemia Hepatic steatosis Mild cardiomegaly Constipation  Significant Procedures:  CTA ECHO

## 2021-08-19 NOTE — Progress Notes (Signed)
Echocardiogram 2D Echocardiogram has been performed.  Warren Lacy Teala Daffron RDCS 08/19/2021, 8:50 AM

## 2021-08-19 NOTE — ED Provider Notes (Signed)
[facility] EMERGENCY DEPARTMENT Provider Note   CSN: [id number] Arrival date & time: 08/18/21  2306     History Chief Complaint  Patient presents with   Chest Pain    Sara Manning is a 35 y.o. female.  35 year old female who presents to the emergency department today secondary to chest pain.  Patient states it started pretty suddenly around 59.  It is severe, sharp retrosternal and radiates towards her back.  She never he had this before.  She states it hurts to take a deep breath.  No recent cough or fever.  She has no GI symptoms such as nausea, vomiting, diarrhea or constipation.  Has not changed with or without eating.  She does have a history of anxiety but nothing like this.  No alcohol, drugs or tobacco.  She did give birth a couple months ago to twins however no complications from that no known cardiomyopathy or gallbladder issues.   Chest Pain     Past Medical History:  Diagnosis Date   Bipolar 2 disorder ([place])    Headache(784.0)    Mood change 11/06/2019    Patient Active Problem List   Diagnosis Date Noted   Headache 04/25/2020   Migraine with aura and with status migrainosus, not intractable 11/06/2019   Eczema 12/01/2015   Bipolar disorder, current episode depressed, moderate ([place]) 06/22/2011   Gastroesophageal reflux disease 04/30/2011    Past Surgical History:  Procedure Laterality Date   WISDOM TOOTH EXTRACTION       OB History     Gravida  4   Para  4   Term  4   Preterm  0   AB  0   Living  5      SAB  0   IAB  0   Ectopic  0   Multiple  1   Live Births  5           Family History  Problem Relation Age of Onset   Bipolar disorder Mother    Diabetes Mother    Bipolar disorder Sister    Schizophrenia Sister     Social History   Tobacco Use   Smoking status: Never   Smokeless tobacco: Never  Vaping Use   Vaping Use: Never used  Substance Use Topics   Alcohol use: No   Drug use: No     Home Medications Prior to Admission medications   Medication Sig Start Date End Date Taking? Authorizing Provider  Blood Pressure KIT Use weekly 01/11/21   Constant, Peggy, MD  hydrocortisone 2.5 % cream Apply topically 2 (two) times daily. 08/03/21   Zola Button, MD  hydrOXYzine (ATARAX/VISTARIL) 25 MG tablet Take 1 tablet (25 mg total) by mouth 3 (three) times daily as needed. 08/03/21   Zola Button, MD  loratadine (CLARITIN) 10 MG tablet Take 1 tablet (10 mg total) by mouth 2 (two) times daily. Can decrease to one pill daily if itching/rash improves. Stay on daily until at least a month postpartum. 08/03/21   Zola Button, MD  Prenatal Vit-Fe Fumarate-FA ([facility] PRENATAL) 27-0.8 MG TABS TAKE 1 TABLET BY MOUTH EVERY DAY 08/17/21   Lilland, Alana, DO  SUMAtriptan (IMITREX) 50 MG tablet Take 1 tablet once as needed for headache. May repeat in 2 hours if headache persists or recurs. 08/03/21   Zola Button, MD  topiramate (TOPAMAX) 25 MG tablet Take one tablet at bedtime for one week, then two tablets at bedtime for one week, then  three tablets at bedtime 08/03/21   Zola Button, MD    Allergies    Patient has no known allergies.  Review of Systems   Review of Systems  Cardiovascular:  Positive for chest pain.  All other systems reviewed and are negative.  Physical Exam Updated Vital Signs BP 128/90 (BP Location: Right Arm)   Pulse (!) 105   Temp 98.6 F (37 C)   Resp (!) 22   Ht _0  (1.702 m)   Wt 85 kg   LMP 12/21/2019   SpO2 99%   BMI 29.35 kg/m   Physical Exam Vitals and nursing note reviewed.  Constitutional:      Appearance: She is well-developed.     Comments: Patient looks uncomfortable.   HENT:     Head: Normocephalic and atraumatic.  Cardiovascular:     Rate and Rhythm: Normal rate and regular rhythm.  Pulmonary:     Effort: No respiratory distress.     Breath sounds: No stridor.  Abdominal:     General: There is no distension.     Palpations: Abdomen is  soft.  Musculoskeletal:     Cervical back: Normal range of motion.  Neurological:     Mental Status: She is alert.    ED Results / Procedures / Treatments   Labs (all labs ordered are listed, but only abnormal results are displayed) Labs Reviewed  CBC WITH DIFFERENTIAL/PLATELET - Abnormal; Notable for the following components:      Result Value   Hemoglobin 11.6 (*)    MCH 24.9 (*)    Platelets 459 (*)    All other components within normal limits  COMPREHENSIVE METABOLIC PANEL  LIPASE, BLOOD  I-STAT BETA HCG BLOOD, ED ([facility], [facility], [facility] ONLY)  TROPONIN I (HIGH SENSITIVITY)  TROPONIN I (HIGH SENSITIVITY)    EKG EKG Interpretation  Date/Time:  Friday August 18 2021 23:25:23 EST Ventricular Rate:  113 PR Interval:  158 QRS Duration: 82 QT Interval:  332 QTC Calculation: 455 R Axis:   86 Text Interpretation: Sinus tachycardia Otherwise normal ECG Confirmed by Merrily Pew [phone]) on 08/19/2021 1:14:50 AM  Radiology DG Chest 2 View  Result Date: 08/19/2021 CLINICAL DATA:  Chest pain EXAM: CHEST - 2 VIEW COMPARISON:  None. FINDINGS: The heart size and mediastinal contours are within normal limits. Both lungs are clear. Unchanged T8 height loss. IMPRESSION: No active cardiopulmonary disease. Electronically Signed   By: Ulyses Jarred M.D.   On: 08/19/2021 00:26    Procedures Procedures   Medications Ordered in ED Medications  fentaNYL (SUBLIMAZE) injection 100 mcg (has no administration in time range)  alum & mag hydroxide-simeth (MAALOX/MYLANTA) 200-200-20 MG/5ML suspension 30 mL (30 mLs Oral Given 08/19/21 0002)    ED Course  I have reviewed the triage vital signs and the nursing notes.  Pertinent labs & imaging results that were available during my care of the patient were reviewed by me and considered in my medical decision making (see chart for details).    MDM Rules/Calculators/A&P                         Patient work-up negative.  Required multiple rounds of IV pain  medication to get her pain under control.  CT scan showed bilateral pleural effusions and cardiomegaly.  With her recent pregnancy consider cardiomyopathy as a cause.  Also possibly pericarditis.  Discussed with family medicine residents for observation for echocardiogram.  Pain seems to be better controlled  at this time.  Low suspicion for ACS with negative troponins, minimal risk factors and normal EKG.  Final Clinical Impression(s) / ED Diagnoses Final diagnoses:  None    Rx / DC Orders ED Discharge Orders     None        Lylian Sanagustin, Corene Cornea, MD 08/19/21 2326

## 2021-08-19 NOTE — Progress Notes (Signed)
FPTS Interim Progress Note  S:Went to assess patient for chest pain.  Continues to have pain in midepigastric area that radiates to left upper breast below axilla.  Reports when at CT had more pain when raising arm and lowering.  Denies any shortness of breath, nausea or vomiting.  Reports pain a little less after having pain medication.    O: BP 101/63 (BP Location: Left Arm)   Pulse 84   Temp (!) 97.5 F (36.4 C) (Oral)   Resp 16   Ht 5\' 7"  (1.702 m)   Wt 85 kg   LMP 12/21/2019   SpO2 98%   BMI 29.35 kg/m    Physical Exam:  General: 35 y.o. female in NAD Cardio: RRR no m/r/g, pain reproducible  Lungs: CTAB, no wheezing, no rhonchi, no crackles, no IWOB on room air Abdomen: Soft, non-tender to palpation, non-distended, positive bowel sounds Skin: warm and dry Extremities: No edema    A/P: Noncardiac chest pain Troponins negative x 2 CTA chest negative for PE Recently received Atarax for anxiety  Continue to monitor for recurrent pain   20, MD 08/19/2021, 10:59 PM PGY-3, [facility] Service pager [phone]

## 2021-08-19 NOTE — ED Notes (Signed)
Patient transported to ECHO.

## 2021-08-19 NOTE — ED Notes (Signed)
Attempted report to 5N. °

## 2021-08-19 NOTE — ED Notes (Addendum)
6E charge RN not accept pt from ED, Burley Saver, MD aware.

## 2021-08-19 NOTE — Progress Notes (Signed)
Family medicine teaching service will be admitting this patient. Our pager information can be located in the physician sticky notes, treatment team sticky notes, and the headers of all our official daily progress notes.   FAMILY MEDICINE TEACHING SERVICE Patient - Please contact intern pager [phone] or text page via website [email or web address] (login: [email or web address]) for questions regarding care. DO NOT page listed attending provider unless there is no answer from the number above.   Carmaleta Youngers, MD PGY-2, [place] [facility] pager [phone]   

## 2021-08-20 ENCOUNTER — Other Ambulatory Visit: Payer: Self-pay | Admitting: Family Medicine

## 2021-08-20 DIAGNOSIS — D649 Anemia, unspecified: Secondary | ICD-10-CM | POA: Diagnosis not present

## 2021-08-20 DIAGNOSIS — R079 Chest pain, unspecified: Secondary | ICD-10-CM | POA: Diagnosis not present

## 2021-08-20 DIAGNOSIS — I517 Cardiomegaly: Secondary | ICD-10-CM

## 2021-08-20 DIAGNOSIS — R160 Hepatomegaly, not elsewhere classified: Secondary | ICD-10-CM | POA: Diagnosis not present

## 2021-08-20 DIAGNOSIS — K760 Fatty (change of) liver, not elsewhere classified: Secondary | ICD-10-CM

## 2021-08-20 DIAGNOSIS — Z20822 Contact with and (suspected) exposure to covid-19: Secondary | ICD-10-CM | POA: Diagnosis not present

## 2021-08-20 DIAGNOSIS — D509 Iron deficiency anemia, unspecified: Secondary | ICD-10-CM

## 2021-08-20 DIAGNOSIS — R0789 Other chest pain: Secondary | ICD-10-CM | POA: Diagnosis not present

## 2021-08-20 LAB — CBC
HCT: 34.5 % — ABNORMAL LOW (ref 36.0–46.0)
Hemoglobin: 10.7 g/dL — ABNORMAL LOW (ref 12.0–15.0)
MCH: 24.9 pg — ABNORMAL LOW (ref 26.0–34.0)
MCHC: 31 g/dL (ref 30.0–36.0)
MCV: 80.4 fL (ref 80.0–100.0)
Platelets: 366 10*3/uL (ref 150–400)
RBC: 4.29 MIL/uL (ref 3.87–5.11)
RDW: 14.9 % (ref 11.5–15.5)
WBC: 4.9 10*3/uL (ref 4.0–10.5)
nRBC: 0 % (ref 0.0–0.2)

## 2021-08-20 LAB — COMPREHENSIVE METABOLIC PANEL
ALT: 21 U/L (ref 0–44)
AST: 18 U/L (ref 15–41)
Albumin: 3.2 g/dL — ABNORMAL LOW (ref 3.5–5.0)
Alkaline Phosphatase: 110 U/L (ref 38–126)
Anion gap: 5 (ref 5–15)
BUN: 8 mg/dL (ref 6–20)
CO2: 26 mmol/L (ref 22–32)
Calcium: 8.6 mg/dL — ABNORMAL LOW (ref 8.9–10.3)
Chloride: 105 mmol/L (ref 98–111)
Creatinine, Ser: 0.86 mg/dL (ref 0.44–1.00)
GFR, Estimated: >60 mL/min (ref >60)
Glucose, Bld: 106 mg/dL — ABNORMAL HIGH (ref 70–99)
Potassium: 3.8 mmol/L (ref 3.5–5.1)
Sodium: 136 mmol/L (ref 135–145)
Total Bilirubin: 0.2 mg/dL — ABNORMAL LOW (ref 0.3–1.2)
Total Protein: 6.5 g/dL (ref 6.5–8.1)

## 2021-08-20 LAB — LIPID PANEL
Cholesterol: 180 mg/dL (ref 0–200)
HDL: 63 mg/dL (ref >40)
LDL Cholesterol: 104 mg/dL — ABNORMAL HIGH (ref 0–99)
Total CHOL/HDL Ratio: 2.9 RATIO
Triglycerides: 67 mg/dL (ref <150)
VLDL: 13 mg/dL (ref 0–40)

## 2021-08-20 LAB — HEMOGLOBIN A1C
Hgb A1c MFr Bld: 5 % (ref 4.8–5.6)
Mean Plasma Glucose: 96.8 mg/dL

## 2021-08-20 LAB — MAGNESIUM: Magnesium: 2.1 mg/dL (ref 1.7–2.4)

## 2021-08-20 LAB — FERRITIN: Ferritin: 9 ng/mL — ABNORMAL LOW (ref 11–307)

## 2021-08-20 LAB — TSH: TSH: 2.478 u[IU]/mL (ref 0.350–4.500)

## 2021-08-20 MED ORDER — ACETAMINOPHEN 325 MG PO TABS
650.0000 mg | ORAL_TABLET | Freq: Four times a day (QID) | ORAL | Status: DC | PRN
  Administered 2021-08-20: 10:00:00 650 mg via ORAL
  Filled 2021-08-20: qty 2

## 2021-08-20 MED ORDER — FAMOTIDINE 20 MG PO TABS
20.0000 mg | ORAL_TABLET | Freq: Every day | ORAL | 0 refills | Status: DC
Start: 2021-08-20 — End: 2023-09-04

## 2021-08-20 MED ORDER — TOPIRAMATE 25 MG PO TABS: 75.0000 mg | ORAL_TABLET | Freq: Every day | ORAL | 0 refills | Status: DC

## 2021-08-20 MED ORDER — IBUPROFEN 400 MG PO TABS: 400.0000 mg | ORAL_TABLET | Freq: Four times a day (QID) | ORAL | 0 refills | Status: DC | PRN

## 2021-08-20 MED ORDER — LIDOCAINE 5 % EX PTCH: 1.0000 | MEDICATED_PATCH | CUTANEOUS | 0 refills | Status: DC

## 2021-08-20 MED ORDER — SENNA 8.6 MG PO TABS
1.0000 | ORAL_TABLET | Freq: Every day | ORAL | Status: DC
  Administered 2021-08-20: 10:00:00 8.6 mg via ORAL
  Filled 2021-08-20: qty 1

## 2021-08-20 NOTE — Discharge Summary (Signed)
[facility] Discharge Summary  Patient name: Sara Manning Medical record number: [id number] Date of birth: 10-Apr-1986 Age: 35 y.o. Gender: female Date of Admission: 08/18/2021  Date of Discharge: 08/20/21 Admitting Physician: Merrily Brittle, DO  Primary Care Provider: Rise Patience, DO Consultants: None  Indication for Hospitalization: Acute Chest Pain  Discharge Diagnoses/Problem List:  Non cardiac chest pain, PE and ACS r/o Iron Deficiency Anemia Hepatosplenomegaly noted on CTA Home Stressors Bipolar 2   Disposition: Home  Discharge Condition: stable  Discharge Exam:  Physical Exam:  General: 35 y.o. female in NAD Cardio: RRR no m/r/g Lungs: CTAB, no wheezing, no rhonchi, no crackles, no IWOB on room air Abdomen: Soft, non-tender to palpation, non-distended, positive bowel sounds Skin: warm and dry Extremities: No edema   Brief Hospital Course:  Sara Manning is a 35 y.o. female presenting with Chest Pain. PMHx is significant for  has a past medical history of Bipolar 2 disorder ([place]), Headache(784.0), and Mood change (11/06/2019).   ED Course: VSS, SpO2 normal on room air. Initial ED labs unremarkable. Troponins negative x2. Lipase 27.  EKG showed sinus tachycardia.  CXR showed mild cardiomegaly. CTA dissection study of chest/abdomen/pelvis negative for dissection but did demonstrate posterior atelectasis and mild cardiomegaly (w/o evidence R heart strain) with incidental findings of hepatic steatosis, constipation, and congenital vertebral anomalies.  In ED, patient received Maalox at midnight, fentanyl 100 mcg at 0200, Dilaudid 1 mg & Toradol 30 mg IV & simethicone 80 mg 0530, Percocet 2 tablets 0633, and meloxicam 7.5 mg p.o. 3474 without relief.    Plan by Problem: Chest pain, non cardiac Patient presented to the ED with during chest pain.  Work up negative for ACS and PE.  Received opioids and atarax with good relief.  Reports no chest  pain or shortness of breath on day of discharge.  Ferritin abnormally low at 9.  Started on Ferrous Sulfate.    Issues for Follow Up:  Referral: ensure BH follow up Preventive: follow up with PCP Labs: Repeat CBC in 3 months PE r/o Iron Deficiency Anemia Hepatic steatosis Mild cardiomegaly Constipation  Significant Procedures:  CTA ECHO   Significant Procedures:   Significant Labs and Imaging:  Recent Labs  Lab 08/19/21 0016 08/20/21 0403  WBC 7.0 4.9  HGB 11.6* 10.7*  HCT 37.4 34.5*  PLT 459* 366   Recent Labs  Lab 08/19/21 0016 08/20/21 0403  NA 137 136  K 3.6 3.8  CL 103 105  CO2 27 26  GLUCOSE 94 106*  BUN 14 8  CREATININE 0.85 0.86  CALCIUM 8.9 8.6*  MG  --  2.1  ALKPHOS 118 110  AST 28 18  ALT 31 21  ALBUMIN 3.6 3.2*    CT Angio Chest Pulmonary Embolism (PE) W or WO Contrast  Result Date: 08/19/2021 CLINICAL DATA:  Chest pain.  Concerning for PE. EXAM: CT ANGIOGRAPHY CHEST WITH CONTRAST TECHNIQUE: Multidetector CT imaging of the chest was performed using the standard protocol during bolus administration of intravenous contrast. Multiplanar CT image reconstructions and MIPs were obtained to evaluate the vascular anatomy. CONTRAST:  29m OMNIPAQUE IOHEXOL 350 MG/ML SOLN COMPARISON:  CT of the chest abdomen pelvis from the same day. FINDINGS: Cardiovascular: Satisfactory opacification of the pulmonary arteries to the segmental level. No evidence of pulmonary embolism. Normal heart size. No pericardial effusion. Mediastinum/Nodes: No enlarged mediastinal, hilar, or axillary lymph nodes. Thyroid gland, trachea, and esophagus demonstrate no significant findings. Lungs/Pleura: Lungs are clear. No  pleural effusion or pneumothorax. Upper Abdomen: No acute abnormality. Musculoskeletal: Slight thoracic kyphosis. 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 Review of the MIP images confirms the above findings. IMPRESSION: 1. No evidence of pulmonary embolus  or other acute abnormality within the thorax. 2. 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 Electronically Signed   By: Fidela Salisbury M.D.   On: 08/19/2021 21:01   ECHOCARDIOGRAM COMPLETE  Result Date: 08/19/2021    ECHOCARDIOGRAM REPORT   Patient Name:   Sara Manning Date of Exam: 08/19/2021 Medical Rec #:  [id number]       Height:       67.0 in Accession #:    [id number]      Weight:       187.4 lb Date of Birth:  Nov 26, 1985       BSA:          1.967 m Patient Age:    35 years        BP:           104/71 mmHg Patient Gender: F               HR:           81 bpm. Exam Location:  Inpatient Procedure: 2D Echo, Color Doppler and Cardiac Doppler Indications:    R07.9* Chest pain, unspecified  History:        Patient has no prior history of Echocardiogram examinations. 2                 months post partum (twins).  Sonographer:    Raquel Sarna Senior RDCS Referring Phys: [id number] JASON MESNER  Sonographer Comments: Exam performed upright due to dyspnea IMPRESSIONS  1. Left ventricular ejection fraction, by estimation, is 60 to 65%. The left ventricle has normal function. The left ventricle has no regional wall motion abnormalities. Left ventricular diastolic parameters were normal.  2. Right ventricular systolic function is normal. The right ventricular size is mildly enlarged.  3. A small pericardial effusion is present.  4. The mitral valve is normal in structure. No evidence of mitral valve regurgitation.  5. The aortic valve is normal in structure. Aortic valve regurgitation is not visualized.  6. The inferior vena cava is normal in size with greater than 50% respiratory variability, suggesting right atrial pressure of 3 mmHg. Comparison(s): No prior Echocardiogram. Conclusion(s)/Recommendation(s): RV looks mildly dilated and there is septal flattening. Would suggest CT PE protocol to assess for PE. FINDINGS  Left Ventricle: Left ventricular ejection fraction, by estimation, is 60 to 65%. The  left ventricle has normal function. The left ventricle has no regional wall motion abnormalities. The left ventricular internal cavity size was normal in size. There is  no left ventricular hypertrophy. Left ventricular diastolic parameters were normal. Right Ventricle: The right ventricular size is mildly enlarged. No increase in right ventricular wall thickness. Right ventricular systolic function is normal. Left Atrium: Left atrial size was normal in size. Right Atrium: Right atrial size was normal in size. Pericardium: A small pericardial effusion is present. Mitral Valve: The mitral valve is normal in structure. No evidence of mitral valve regurgitation. Tricuspid Valve: The tricuspid valve is normal in structure. Tricuspid valve regurgitation is not demonstrated. Aortic Valve: The aortic valve is normal in structure. Aortic valve regurgitation is not visualized. Pulmonic Valve: The pulmonic valve was normal in structure. Pulmonic valve regurgitation is not visualized. Aorta: The aortic root and ascending aorta are structurally normal, with  no evidence of dilitation. Venous: The inferior vena cava is normal in size with greater than 50% respiratory variability, suggesting right atrial pressure of 3 mmHg. IAS/Shunts: The interatrial septum was not well visualized.  LEFT VENTRICLE PLAX 2D LVIDd:         4.00 cm   Diastology LVIDs:         2.50 cm   LV e' medial:    8.59 cm/s LV PW:         0.80 cm   LV E/e' medial:  8.1 LV IVS:        0.80 cm   LV e' lateral:   11.70 cm/s LVOT diam:     2.00 cm   LV E/e' lateral: 5.9 LV SV:         58 LV SV Index:   29 LVOT Area:     3.14 cm  RIGHT VENTRICLE RV S prime:     13.20 cm/s TAPSE (M-mode): 1.8 cm LEFT ATRIUM             Index        RIGHT ATRIUM           Index LA diam:        2.80 cm 1.42 cm/m   RA Area:     12.50 cm LA Vol (A2C):   33.2 ml 16.88 ml/m  RA Volume:   28.00 ml  14.23 ml/m LA Vol (A4C):   16.1 ml 8.18 ml/m LA Biplane Vol: 24.9 ml 12.66 ml/m  AORTIC  VALVE LVOT Vmax:   99.00 cm/s LVOT Vmean:  67.200 cm/s LVOT VTI:    0.184 m  AORTA Ao Root diam: 3.20 cm Ao Asc diam:  2.90 cm MITRAL VALVE MV Area (PHT): 2.77 cm    SHUNTS MV Decel Time: 274 msec    Systemic VTI:  0.18 m MV E velocity: 69.20 cm/s  Systemic Diam: 2.00 cm MV A velocity: 57.60 cm/s MV E/A ratio:  1.20 Landscape architect signed by Phineas Inches Signature Date/Time: 08/19/2021/3:20:19 PM    Final      Results/Tests Pending at Time of Discharge: None  Discharge Medications:  Allergies as of 08/20/2021   No Known Allergies      Medication List     STOP taking these medications    loratadine 10 MG tablet Commonly known as: CLARITIN       TAKE these medications    Blood Pressure Kit Use weekly   [facility]S Prenatal 27-0.8 MG Tabs TAKE 1 TABLET BY MOUTH EVERY DAY   famotidine 20 MG tablet Commonly known as: PEPCID Take 1 tablet (20 mg total) by mouth daily.   hydrocortisone 2.5 % cream Apply topically 2 (two) times daily. What changed:  how much to take when to take this reasons to take this   hydrOXYzine 25 MG tablet Commonly known as: ATARAX Take 1 tablet (25 mg total) by mouth 3 (three) times daily as needed.   ibuprofen 400 MG tablet Commonly known as: ADVIL Take 1 tablet (400 mg total) by mouth every 6 (six) hours as needed.   lidocaine 5 % Commonly known as: LIDODERM Place 1 patch onto the skin daily. Remove & Discard patch within 12 hours or as directed by MD   SUMAtriptan 50 MG tablet Commonly known as: Imitrex Take 1 tablet once as needed for headache. May repeat in 2 hours if headache persists or recurs.   topiramate 25 MG tablet Commonly known as: Topamax Take 3 tablets (  75 mg total) by mouth at bedtime. What changed:  how much to take how to take this when to take this additional instructions        Discharge Instructions: Please refer to Patient Instructions section of EMR for full details.  Patient was counseled important signs  and symptoms that should prompt return to medical care, changes in medications, dietary instructions, activity restrictions, and follow up appointments.   Follow-Up Appointments:  Follow-up Information     [place]. Go on 08/30/2021.   Why: Appointment time 1050 am.  Please arrive 15 mins prior to scheduled time Contact information: [place]                Carollee Leitz, MD 08/20/2021, 8:38 AM PGY-3, [place]

## 2021-08-20 NOTE — Progress Notes (Signed)
Upon starting of shift pt made nurse aware of care that was given to pt. Nurse made management aware. Pt had c/o chest pain and left shoulder pain upon start of shift, meds were given per dr order. After CT, pt c/o chest pain and left shoulder pain, VS was WNL. Nurse applied ice and given PRN Atarax 25mg  and made MD aware. Pt started to feel more relaxed and less pain in about 30 min. Pt is now asleep with bed in lowest position, call light in place. Will continue to monitor.

## 2021-08-20 NOTE — Progress Notes (Signed)
Pt is up and alert. States pain is at a 4 out of 10 in her chest. Will continue to monitor.

## 2021-08-20 NOTE — Progress Notes (Signed)
FPTS Interim Night Progress Note  S:Patient sleeping comfortably.  Rounded with primary night RN.  Reports patient sleeping soundly since received Atarax.  No concerns voiced.  No orders required.    O: Today's Vitals   08/19/21 2000 08/19/21 2100 08/19/21 2228 08/20/21 0300  BP:   101/63 106/65  Pulse:   84 78  Resp: (!) 21  20 14   Temp:   (!) 97.5 F (36.4 C) 98.1 F (36.7 C)  TempSrc:   Oral Oral  SpO2:   98% 99%  Weight:      Height:      PainSc: 9  9   0-No pain      A/P: Continue current management  MD PGY-3, [facility] Service pager [phone]

## 2021-08-20 NOTE — Discharge Instructions (Addendum)
You were admitted to the hospital with chest pain.  Your work up did not show andy blood clots in your lungs or heart attack.   Your hemoglobin was a little low and you were started on iron supplements daily.  For your chest pain you can take Ibuprofen as ordered.  You will need to follow up with PCP on Dec 14 at 1050 am.  Please arrive 15 mins prior to scheduled time   What to do after you leave the hospital: Recommended diet: regular diet Recommended activity: activity as tolerated  Please seek medical attention if you experience any chest pain, shortness of breath, fevers, bleeding.

## 2021-08-20 NOTE — Progress Notes (Signed)
Mobility Specialist Progress Note   08/20/21 1209  Mobility  Activity Refused mobility (Currently d/c home, wants to save energy.)   Frederico Hamman Mobility Specialist Phone Number [phone]

## 2021-08-20 NOTE — Progress Notes (Signed)
Family Medicine Teaching Service Daily Progress Note Intern Pager: [phone]  Patient name: Sara Manning Medical record number: [id number] Date of birth: [birth year] Age: 35 y.o. Gender: female  Primary Care Provider: Evelena Leyden, DO Consultants: None Code Status: Full  Pt Overview and Major Events to Date:  12/03: Admitted to FPTS  Assessment and Plan: Sara Manning is a 35 y.o. female presenting with tearing chest pain. PMH is significant for BPD, GERD, eczema, migraine w/aura + status migrainosus.   Non Cardiac Chest pain, likely MSK Hemodynamically stable.  CTA chest negative for PE.  Troponins negative.  No complaints chest pain or shortness of breath. Has been using Ice packs with some relief.  Exam benign today. -Continue to monitor -Continue Pepcid 20 mg BID -Continue Atarax 25 mg TID -Continue Lidoderm patch -If no continued pain likely can discharge today -Follow up outpatient BH    Bipolar 2 disorder Continue Atarax 25 mg TID Follow up [facility] outpatient   Constipation No lower abdominal pain.  Passing flatus.  Does not remember when last had BM. -Continue Miralax daily -Continue Senna daily    Migraine with aura  Hx status migrainosus -Continue Topamax 75 mg qhs  GERD Epigastric pain.  Received GI cocktail last night -Continue Pepcid 20 mg daily  Eczema  Seasonal allergies Continue home medications prn  Mild normocytic anemia Hbg dropped ~1g overnight.  No signs of bleeding.  Likely iron deficiency -Ferritin 9 -Start Ferrous sulfate 325 mg daily -CBC daily -Continue to monitor   Hepatomegaly No RUQ pain.  Reports no history of liver disease. Mild steatosis on CTA.  -Follow up Lipid panel -Consider outpatient GI consult  FEN/GI: Regular PPx: Lovenox Dispo:Home pending clinical improvement . Barriers include none.   Subjective:  No acute concerns overnight. Slept well after Atarax and no further chest pain or shortness of  breath.  Objective: Temp:  [97.5 F (36.4 C)-98.4 F (36.9 C)] 98.1 F (36.7 C) (12/04 0300) Pulse Rate:  [67-96] 78 (12/04 0300) Resp:  [13-24] 14 (12/04 0300) BP: (99-132)/(62-89) 106/65 (12/04 0300) SpO2:  [93 %-99 %] 99 % (12/04 0300)  Physical Exam:  General: 35 y.o. female in NAD Cardio: RRR no m/r/g Lungs: CTAB, no wheezing, no rhonchi, no crackles, no IWOB on room air Abdomen: Soft, non-tender to palpation, non-distended, positive bowel sounds Skin: warm and dry Extremities: No edema  Laboratory: Recent Labs  Lab 08/19/21 0016 08/20/21 0403  WBC 7.0 4.9  HGB 11.6* 10.7*  HCT 37.4 34.5*  PLT 459* 366   Recent Labs  Lab 08/19/21 0016  NA 137  K 3.6  CL 103  CO2 27  BUN 14  CREATININE 0.85  CALCIUM 8.9  PROT 7.3  BILITOT 0.3  ALKPHOS 118  ALT 31  AST 28  GLUCOSE 94      Imaging/Diagnostic Tests: CT Angio Chest Pulmonary Embolism (PE) W or WO Contrast  Result Date: 08/19/2021 CLINICAL DATA:  Chest pain.  Concerning for PE. EXAM: CT ANGIOGRAPHY CHEST WITH CONTRAST TECHNIQUE: Multidetector CT imaging of the chest was performed using the standard protocol during bolus administration of intravenous contrast. Multiplanar CT image reconstructions and MIPs were obtained to evaluate the vascular anatomy. CONTRAST:  19mL OMNIPAQUE IOHEXOL 350 MG/ML SOLN COMPARISON:  CT of the chest abdomen pelvis from the same day. FINDINGS: Cardiovascular: Satisfactory opacification of the pulmonary arteries to the segmental level. No evidence of pulmonary embolism. Normal heart size. No pericardial effusion. Mediastinum/Nodes: No enlarged mediastinal, hilar, or axillary lymph nodes.  Thyroid gland, trachea, and esophagus demonstrate no significant findings. Lungs/Pleura: Lungs are clear. No pleural effusion or pneumothorax. Upper Abdomen: No acute abnormality. Musculoskeletal: Slight thoracic kyphosis. 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 Review of the MIP  images confirms the above findings. IMPRESSION: 1. No evidence of pulmonary embolus or other acute abnormality within the thorax. 2. 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 Electronically Signed   By: Ted Mcalpine M.D.   On: 08/19/2021 21:01   ECHOCARDIOGRAM COMPLETE  Result Date: 08/19/2021    ECHOCARDIOGRAM REPORT   Patient Name:   Sara Manning Date of Exam: 08/19/2021 Medical Rec #:  [id number]       Height:       67.0 in Accession #:    [id number]      Weight:       187.4 lb Date of Birth:  Feb 08, 1986       BSA:          1.967 m Patient Age:    35 years        BP:           104/71 mmHg Patient Gender: F               HR:           81 bpm. Exam Location:  Inpatient Procedure: 2D Echo, Color Doppler and Cardiac Doppler Indications:    R07.9* Chest pain, unspecified  History:        Patient has no prior history of Echocardiogram examinations. 2                 months post partum (twins).  Sonographer:    Irving Burton Senior RDCS Referring Phys: [id number] JASON MESNER  Sonographer Comments: Exam performed upright due to dyspnea IMPRESSIONS  1. Left ventricular ejection fraction, by estimation, is 60 to 65%. The left ventricle has normal function. The left ventricle has no regional wall motion abnormalities. Left ventricular diastolic parameters were normal.  2. Right ventricular systolic function is normal. The right ventricular size is mildly enlarged.  3. A small pericardial effusion is present.  4. The mitral valve is normal in structure. No evidence of mitral valve regurgitation.  5. The aortic valve is normal in structure. Aortic valve regurgitation is not visualized.  6. The inferior vena cava is normal in size with greater than 50% respiratory variability, suggesting right atrial pressure of 3 mmHg. Comparison(s): No prior Echocardiogram. Conclusion(s)/Recommendation(s): RV looks mildly dilated and there is septal flattening. Would suggest CT PE protocol to assess for PE. FINDINGS   Left Ventricle: Left ventricular ejection fraction, by estimation, is 60 to 65%. The left ventricle has normal function. The left ventricle has no regional wall motion abnormalities. The left ventricular internal cavity size was normal in size. There is  no left ventricular hypertrophy. Left ventricular diastolic parameters were normal. Right Ventricle: The right ventricular size is mildly enlarged. No increase in right ventricular wall thickness. Right ventricular systolic function is normal. Left Atrium: Left atrial size was normal in size. Right Atrium: Right atrial size was normal in size. Pericardium: A small pericardial effusion is present. Mitral Valve: The mitral valve is normal in structure. No evidence of mitral valve regurgitation. Tricuspid Valve: The tricuspid valve is normal in structure. Tricuspid valve regurgitation is not demonstrated. Aortic Valve: The aortic valve is normal in structure. Aortic valve regurgitation is not visualized. Pulmonic Valve: The pulmonic valve was normal in structure. Pulmonic valve regurgitation  is not visualized. Aorta: The aortic root and ascending aorta are structurally normal, with no evidence of dilitation. Venous: The inferior vena cava is normal in size with greater than 50% respiratory variability, suggesting right atrial pressure of 3 mmHg. IAS/Shunts: The interatrial septum was not well visualized.  LEFT VENTRICLE PLAX 2D LVIDd:         4.00 cm   Diastology LVIDs:         2.50 cm   LV e' medial:    8.59 cm/s LV PW:         0.80 cm   LV E/e' medial:  8.1 LV IVS:        0.80 cm   LV e' lateral:   11.70 cm/s LVOT diam:     2.00 cm   LV E/e' lateral: 5.9 LV SV:         58 LV SV Index:   29 LVOT Area:     3.14 cm  RIGHT VENTRICLE RV S prime:     13.20 cm/s TAPSE (M-mode): 1.8 cm LEFT ATRIUM             Index        RIGHT ATRIUM           Index LA diam:        2.80 cm 1.42 cm/m   RA Area:     12.50 cm LA Vol (A2C):   33.2 ml 16.88 ml/m  RA Volume:   28.00 ml  14.23  ml/m LA Vol (A4C):   16.1 ml 8.18 ml/m LA Biplane Vol: 24.9 ml 12.66 ml/m  AORTIC VALVE LVOT Vmax:   99.00 cm/s LVOT Vmean:  67.200 cm/s LVOT VTI:    0.184 m  AORTA Ao Root diam: 3.20 cm Ao Asc diam:  2.90 cm MITRAL VALVE MV Area (PHT): 2.77 cm    SHUNTS MV Decel Time: 274 msec    Systemic VTI:  0.18 m MV E velocity: 69.20 cm/s  Systemic Diam: 2.00 cm MV A velocity: 57.60 cm/s MV E/A ratio:  1.20 Carolan Clines Electronically signed by Carolan Clines Signature Date/Time: 08/19/2021/3:20:19 PM    Final      Dana Allan, MD 08/20/2021, 4:42 AM PGY-3, [place] [facility] FPTS Intern pager: [phone], text pages welcome

## 2021-08-20 NOTE — Progress Notes (Signed)
After patient was admitted on to the floor, Charge and myself done the assessment. Within the time the Pt was admitted to the time the Pt was complaining of severe chest pain, was in the room every 20-30 minutes doing rounds. Around 1840  was then asked to go into the room, Pt was then asked to rate the chest pain. 10 out of 10 was stated as well with the pain radiating to the left shoulder and down the arm along with pain during coughing. Tried calling the number that is associated with the attending on the summary page, couldn't get through. Proceeded to secure chat and page the MD with amnion to get connected to someone STAT about this situation. After a minute a call was received and then reported the situation.

## 2021-08-22 NOTE — Telephone Encounter (Signed)
Called patient's pharmacy to discuss the medication requests. Appears that several of these medications already have refills associated. Will refuse them for now and re-address if requests come back through in an appropriate time for refills.    Sara Zetina, DO

## 2021-08-30 ENCOUNTER — Ambulatory Visit: Payer: Medicaid Other | Admitting: Family Medicine

## 2021-08-30 NOTE — Patient Instructions (Incomplete)
Thank you for coming to see me today. It was a pleasure.     Please follow-up with PCP as needed  If you have any questions or concerns, please do not hesitate to call the office at [phone].  Best,   Dana Allan, MD    Costochondritis Costochondritis is irritation and swelling (inflammation) of the tissue that connects the ribs to the breastbone (sternum). This tissue is called cartilage. Costochondritis causes pain in the front of the chest. Usually, the pain: Starts slowly. Is in more than one rib. What are the causes? The exact cause of this condition is not always known. It results from stress on the tissue in the affected area. The cause of this stress could be: Chest injury. Exercise or activity, such as lifting. Very bad coughing. What increases the risk? You are more likely to develop this condition if you: Are female. Are 47-77 years old. Recently started a new exercise or work activity. Have low levels of vitamin D. Have a condition that makes you cough often. What are the signs or symptoms? The main symptom of this condition is chest pain. The pain: Usually starts slowly and can be sharp or dull. Gets worse with deep breathing, coughing, or exercise. Gets better with rest. May be worse when you press on the affected area of your ribs and breastbone. How is this treated? This condition usually goes away on its own over time. Your doctor may prescribe an NSAID, such as ibuprofen. This can help reduce pain and inflammation. Treatment may also include: Resting and avoiding activities that make pain worse. Putting heat or ice on the painful area. Doing exercises to stretch your chest muscles. If these treatments do not help, your doctor may inject a numbing medicine to help relieve the pain. Follow these instructions at home: Managing pain, stiffness, and swelling   If told, put ice on the painful area. To do this: Put ice in a plastic bag. Place a towel  between your skin and the bag. Leave the ice on for 20 minutes, 2-3 times a day. If told, put heat on the affected area. Do this as often as told by your doctor. Use the heat source that your doctor recommends, such as a moist heat pack or a heating pad. Place a towel between your skin and the heat source. Leave the heat on for 20-30 minutes. Take off the heat if your skin turns bright red. This is very important if you cannot feel pain, heat, or cold. You may have a greater risk of getting burned. Activity Rest as told by your doctor. Do not do anything that makes your pain worse. This includes any activities that use chest, belly (abdomen), and side muscles. Do not lift anything that is heavier than 10 lb (4.5 kg), or the limit that you are told, until your doctor says that it is safe. Return to your normal activities as told by your doctor. Ask your doctor what activities are safe for you. General instructions Take over-the-counter and prescription medicines only as told by your doctor. Keep all follow-up visits as told by your doctor. This is important. Contact a doctor if: You have chills or a fever. Your pain does not go away or it gets worse. You have a cough that does not go away. Get help right away if: You are short of breath. You have very bad chest pain that is not helped by medicines, heat, or ice. These symptoms may be an emergency. Do  not wait to see if the symptoms will go away. Get medical help right away. Call your local emergency services (911 in the [place] Do not drive yourself to the hospital. Summary Costochondritis is irritation and swelling (inflammation) of the tissue that connects the ribs to the breastbone (sternum). This condition causes pain in the front of the chest. Treatment may include medicines, rest, heat or ice, and exercises. This information is not intended to replace advice given to you by your health care provider. Make sure you discuss any questions  you have with your health care provider. Document Revised: 07/17/2019 Document Reviewed: 07/17/2019 Elsevier Patient Education  2022 [employer].

## 2021-08-30 NOTE — Progress Notes (Deleted)
° ° °  SUBJECTIVE:   CHIEF COMPLAINT / HPI:   Patient admitted to hospital between ** and ** and treated for ***. Discharged on **. Presents today for follow up **. Reports improvement in ** Requires repeat ** BMP/ renal function panel/CBC today.   PERTINENT  PMH / PSH: ***  OBJECTIVE:   LMP 12/21/2019    General: Alert, no acute distress Cardio: Normal S1 and S2, RRR, no r/m/g Pulm: CTAB, normal work of breathing Abdomen: Bowel sounds normal. Abdomen soft and non-tender.  Extremities: No peripheral edema.  Neuro: Cranial nerves grossly intact   ASSESSMENT/PLAN:   No problem-specific Assessment & Plan notes found for this encounter.     Dana Allan, MD [facility]

## 2021-08-31 ENCOUNTER — Ambulatory Visit (INDEPENDENT_AMBULATORY_CARE_PROVIDER_SITE_OTHER): Payer: Medicaid Other | Admitting: Family Medicine

## 2021-08-31 ENCOUNTER — Other Ambulatory Visit: Payer: Self-pay

## 2021-08-31 ENCOUNTER — Encounter: Payer: Self-pay | Admitting: Family Medicine

## 2021-08-31 VITALS — BP 117/90 | HR 90 | Ht 67.0 in | Wt 174.8 lb

## 2021-08-31 DIAGNOSIS — F3132 Bipolar disorder, current episode depressed, moderate: Secondary | ICD-10-CM | POA: Diagnosis not present

## 2021-08-31 DIAGNOSIS — R0782 Intercostal pain: Secondary | ICD-10-CM

## 2021-08-31 NOTE — Patient Instructions (Signed)
Please follow-up with your psychiatry appointments in January.  I asked that you schedule follow-up with our office after your appointment so that we can check in on your mood and medications.   We will plan to see you in February 2023 to repeat blood work to check your blood counts.

## 2021-08-31 NOTE — Progress Notes (Signed)
° ° °  SUBJECTIVE:   CHIEF COMPLAINT / HPI: Hospital follow-up for chest pain  Patient was admitted recently for non-cardiac chest pain. Reports feeling improved since hospital discharge. She denies SOB, current chest pain. She states that she sometimes has sharp intermittent chest pains that quickly resolve. She reports decreased frequency since hospital discharge.   Hx of Bipolar D/O  Patient states that she is not currently taking any medications for this issue. She reports dealing with a lot of stress since finding out her 82 year old daughter has cancer. Patient reports trying multiple medications in the past for her headaches that made her feel unwell so she stopped taking them. She states that she has an appointment scheduled for Jan 9 and 17th in 2023 with behavioral health.   PERTINENT  PMH / PSH: Bipolar d/o  Non cardiac chest pain  Migraine HA   OBJECTIVE:   BP 117/90    Pulse 90    Ht 5\' 7"  (1.702 m)    Wt 174 lb 12.8 oz (79.3 kg)    LMP 12/21/2019    SpO2 100%    BMI 27.38 kg/m   General: female appearing stated age in no acute distress Cardio: Normal S1 and S2, no S3 or S4. Rhythm is regular. No murmurs or rubs.  Bilateral radial pulses palpable, no chest tenderness to palpation  Pulm: Clear to auscultation bilaterally, no crackles, wheezing, or diminished breath sounds. Normal respiratory effort, stable on RA Abdomen: Bowel sounds normal. Abdomen soft and non-tender.  Extremities: No peripheral edema. Warm/ well perfused.   ASSESSMENT/PLAN:   Bipolar disorder, current episode depressed, moderate (HCC) Patient to follow up with behavioral health as scheduled  No medication changes today   Chest pain Improved from prior to admission Denies current chest pain or SOB  Discussed f/u appt with plan to check CBC at 3 months      02/20/2020, MD [facility]

## 2021-09-01 NOTE — Assessment & Plan Note (Signed)
Patient to follow up with behavioral health as scheduled  No medication changes today

## 2021-09-01 NOTE — Assessment & Plan Note (Signed)
Improved from prior to admission Denies current chest pain or SOB  Discussed f/u appt with plan to check CBC at 3 months

## 2021-09-25 ENCOUNTER — Ambulatory Visit (INDEPENDENT_AMBULATORY_CARE_PROVIDER_SITE_OTHER): Payer: Medicaid Other | Admitting: Clinical

## 2021-09-25 ENCOUNTER — Other Ambulatory Visit: Payer: Self-pay

## 2021-09-25 DIAGNOSIS — F331 Major depressive disorder, recurrent, moderate: Secondary | ICD-10-CM | POA: Diagnosis not present
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
## 2021-10-03 ENCOUNTER — Ambulatory Visit (INDEPENDENT_AMBULATORY_CARE_PROVIDER_SITE_OTHER): Payer: Medicaid Other | Admitting: Physician Assistant

## 2021-10-03 ENCOUNTER — Other Ambulatory Visit: Payer: Self-pay

## 2021-10-03 ENCOUNTER — Encounter (HOSPITAL_COMMUNITY): Payer: Self-pay | Admitting: Physician Assistant

## 2021-10-03 VITALS — BP 121/88 | HR 85 | Ht 67.0 in | Wt 181.0 lb

## 2021-10-03 DIAGNOSIS — F331 Major depressive disorder, recurrent, moderate: Secondary | ICD-10-CM | POA: Insufficient documentation

## 2021-10-03 DIAGNOSIS — F410 Panic disorder [episodic paroxysmal anxiety] without agoraphobia: Secondary | ICD-10-CM | POA: Insufficient documentation

## 2021-10-03 DIAGNOSIS — F411 Generalized anxiety disorder: Secondary | ICD-10-CM | POA: Diagnosis not present

## 2021-10-03 DIAGNOSIS — G4700 Insomnia, unspecified: Secondary | ICD-10-CM | POA: Diagnosis not present

## 2021-10-03 MED ORDER — HYDROXYZINE HCL 10 MG PO TABS: 10.0000 mg | ORAL_TABLET | Freq: Three times a day (TID) | ORAL | 1 refills | Status: DC | PRN

## 2021-10-03 MED ORDER — TRAZODONE HCL 50 MG PO TABS: 50.0000 mg | ORAL_TABLET | Freq: Every day | ORAL | 1 refills | Status: DC

## 2021-10-03 MED ORDER — SERTRALINE HCL 50 MG PO TABS: 50.0000 mg | ORAL_TABLET | Freq: Every day | ORAL | 1 refills | Status: DC
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
## 2021-11-03 DIAGNOSIS — Z20822 Contact with and (suspected) exposure to covid-19: Secondary | ICD-10-CM | POA: Diagnosis not present

## 2021-11-11 DIAGNOSIS — Z20822 Contact with and (suspected) exposure to covid-19: Secondary | ICD-10-CM | POA: Diagnosis not present

## 2021-11-15 ENCOUNTER — Other Ambulatory Visit (HOSPITAL_COMMUNITY): Payer: Self-pay | Admitting: Physician Assistant

## 2021-11-15 DIAGNOSIS — F411 Generalized anxiety disorder: Secondary | ICD-10-CM

## 2021-11-20 ENCOUNTER — Other Ambulatory Visit: Payer: Self-pay | Admitting: Family Medicine

## 2021-11-20 DIAGNOSIS — Z3201 Encounter for pregnancy test, result positive: Secondary | ICD-10-CM

## 2021-11-22 ENCOUNTER — Encounter (HOSPITAL_COMMUNITY): Payer: Self-pay | Admitting: Physician Assistant

## 2021-11-22 ENCOUNTER — Other Ambulatory Visit: Payer: Self-pay

## 2021-11-22 ENCOUNTER — Ambulatory Visit (INDEPENDENT_AMBULATORY_CARE_PROVIDER_SITE_OTHER): Payer: Medicaid Other | Admitting: Physician Assistant

## 2021-11-22 DIAGNOSIS — F411 Generalized anxiety disorder: Secondary | ICD-10-CM | POA: Diagnosis not present

## 2021-11-22 DIAGNOSIS — F331 Major depressive disorder, recurrent, moderate: Secondary | ICD-10-CM

## 2021-11-22 DIAGNOSIS — G4700 Insomnia, unspecified: Secondary | ICD-10-CM | POA: Diagnosis not present

## 2021-11-22 MED ORDER — SERTRALINE HCL 50 MG PO TABS: 50.0000 mg | ORAL_TABLET | Freq: Every day | ORAL | 1 refills | Status: DC

## 2021-11-22 MED ORDER — TRAZODONE HCL 50 MG PO TABS: 50.0000 mg | ORAL_TABLET | Freq: Every day | ORAL | 1 refills | Status: DC

## 2021-11-22 NOTE — Progress Notes (Cosign Needed)
BH MD/PA/NP OP Progress Note  11/22/2021 7:12 PM Sara Manning  MRN:  [id number]  Chief Complaint:  Chief Complaint  Patient presents with   Medication Management    F/U MM   HPI:   Sara Manning "Sara Manning"  Visit Diagnosis:    ICD-10-CM   1. Insomnia, unspecified type  G47.00 traZODone (DESYREL) 50 MG tablet    2. Moderate episode of recurrent major depressive disorder (HCC)  F33.1 sertraline (ZOLOFT) 50 MG tablet    3. Generalized anxiety disorder  F41.1 sertraline (ZOLOFT) 50 MG tablet      Past Psychiatric History:  Insomnia Major depressive disorder Generalized anxiety disorder  Patient reports that she was diagnosed with Bipolar disorder in 2012. Patient was seen at [facility] practice at the time of diagnosis.  Past Medical History:  Past Medical History:  Diagnosis Date   Bipolar 2 disorder ([place])    Headache(784.0)    Mood change 11/06/2019    Past Surgical History:  Procedure Laterality Date   WISDOM TOOTH EXTRACTION      Family Psychiatric History:  Mother - Bipolar disorder Sister - Schizophrenia  Family History:  Family History  Problem Relation Age of Onset   Bipolar disorder Mother    Diabetes Mother    Bipolar disorder Sister    Schizophrenia Sister     Social History:  Social History   Socioeconomic History   Marital status: Single    Spouse name: Not on file   Number of children: Not on file   Years of education: Not on file   Highest education level: Not on file  Occupational History   Not on file  Tobacco Use   Smoking status: Never   Smokeless tobacco: Never  Vaping Use   Vaping Use: Never used  Substance and Sexual Activity   Alcohol use: No   Drug use: No   Sexual activity: Yes    Birth control/protection: None  Other Topics Concern   Not on file  Social History Narrative   Not on file   Social Determinants of Health   Financial Resource Strain: Not on file  Food Insecurity: No Food Insecurity   Worried About  Running Out of Food in the Last Year: Never true   [place] in the Last Year: Never true  Transportation Needs: No Transportation Needs   Lack of Transportation (Medical): No   Lack of Transportation (Non-Medical): No  Physical Activity: Not on file  Stress: Not on file  Social Connections: Not on file    Allergies: No Known Allergies  Metabolic Disorder Labs: Lab Results  Component Value Date   HGBA1C 5.0 08/20/2021   MPG 96.8 08/20/2021   No results found for: PROLACTIN Lab Results  Component Value Date   CHOL 180 08/20/2021   TRIG 67 08/20/2021   HDL 63 08/20/2021   CHOLHDL 2.9 08/20/2021   VLDL 13 08/20/2021   LDLCALC 104 (H) 08/20/2021   Lab Results  Component Value Date   TSH 2.478 08/20/2021   TSH 1.160 08/21/2018    Therapeutic Level Labs: No results found for: LITHIUM No results found for: VALPROATE No components found for:  CBMZ  Current Medications: Current Outpatient Medications  Medication Sig Dispense Refill   Blood Pressure KIT Use weekly 1 kit 0   famotidine (PEPCID) 20 MG tablet Take 1 tablet (20 mg total) by mouth daily. 30 tablet 0   hydrocortisone 2.5 % cream Apply topically 2 (two) times daily. (Patient  taking differently: Apply 1 application. topically daily as needed (For eczma).) 40 g 0   ibuprofen (ADVIL) 400 MG tablet Take 1 tablet (400 mg total) by mouth every 6 (six) hours as needed. 30 tablet 0   lidocaine (LIDODERM) 5 % Place 1 patch onto the skin daily. Remove & Discard patch within 12 hours or as directed by MD 10 patch 0   Prenatal Vit-Fe Fumarate-FA ([employer] PRENATAL) 27-0.8 MG TABS Take 1 tablet by mouth daily. 60 tablet 3   topiramate (TOPAMAX) 25 MG tablet Take 3 tablets (75 mg total) by mouth at bedtime. 30 tablet 0   hydrOXYzine (ATARAX) 10 MG tablet TAKE 1 TABLET BY MOUTH THREE TIMES A DAY AS NEEDED (Patient not taking: Reported on 11/22/2021) 75 tablet 1   sertraline (ZOLOFT) 50 MG tablet Take 1 tablet (50 mg total) by mouth  daily. 30 tablet 1   traZODone (DESYREL) 50 MG tablet Take 1 tablet (50 mg total) by mouth at bedtime. 30 tablet 1   No current facility-administered medications for this visit.     Musculoskeletal: Strength & Muscle Tone: within normal limits Gait & Station: normal Patient leans: N/A  Psychiatric Specialty Exam: Review of Systems  Psychiatric/Behavioral:  Positive for sleep disturbance. Negative for decreased concentration, dysphoric mood, hallucinations, self-injury and suicidal ideas. The patient is nervous/anxious. The patient is not hyperactive.    Blood pressure (!) 127/94, pulse 84, height $RemoveBe'5\' 7"'RucMilKNQ$  (1.702 m), weight 189 lb (85.7 kg), not currently breastfeeding.Body mass index is 29.6 kg/m.  General Appearance: Fairly Groomed  Eye Contact:  Good  Speech:  Clear and Coherent and Normal Rate  Volume:  Normal  Mood:  Anxious and Depressed  Affect:  Congruent  Thought Process:  Coherent, Goal Directed, and Descriptions of Associations: Intact  Orientation:  Full (Time, Place, and Person)  Thought Content: WDL   Suicidal Thoughts:  No  Homicidal Thoughts:  No  Memory:  Immediate;   Good Recent;   Good Remote;   Good  Judgement:  Good  Insight:  Good  Psychomotor Activity:  Normal  Concentration:  Concentration: Good and Attention Span: Good  Recall:  Good  Fund of Knowledge: Good  Language: Good  Akathisia:  No  Handed:  Right  AIMS (if indicated): not done  Assets:  Communication Skills Desire for Improvement Housing  ADL's:  Intact  Cognition: WNL  Sleep:  Poor   Screenings: GAD-7    Physiological scientist Office Visit from 11/22/2021 in [facility] Office Visit from 10/03/2021 in [facility] Counselor from 09/25/2021 in [place] from 07/25/2021 in [place] for [place] at [facility] Women Routine Prenatal from 06/09/2021 in [place] for  [place] at [facility]  Total GAD-7 Score $RemoveBef'20 21 14 18 18      'MZUCbhaDHc$ [employer]    [place] Office Visit from 11/22/2021 in [facility] Office Visit from 10/03/2021 in [facility] Counselor from 09/25/2021 in [facility] Office Visit from 08/03/2021 in [place] from 07/25/2021 in [place] for [place] at [facility]  PHQ-2 Total Score $RemoveBef'5 6 4 5 1  'zmgWgRwrHt$ PHQ-9 Total Score $RemoveBef'21 24 16 20 16      'cEMMLqebLJ$ [place] Office Visit from 11/22/2021 in [facility] Office Visit from 10/03/2021 in [facility] Counselor from 09/25/2021 in [place] No Risk No Risk No Risk        Assessment and Plan:     Collaboration of Care: Collaboration of Care: Medication Management AEB provider managing patient's medications and Psychiatrist AEB patient being followed by a mental health provider  Patient/Guardian was advised Release of Information must be obtained prior to any record release in order to collaborate their care with an outside provider. Patient/Guardian was advised if they have not already done so to contact the registration department to sign all necessary forms in order for [place] to release information regarding their care.   Consent: Patient/Guardian gives verbal consent for treatment and assignment of benefits for services provided during this visit. Patient/Guardian expressed understanding and agreed to proceed.   1. Insomnia, unspecified type  - traZODone (DESYREL) 50 MG tablet; Take 1 tablet (50 mg total) by mouth at bedtime.  Dispense: 30 tablet; Refill: 1  2. Moderate episode of recurrent major depressive disorder (HCC)  - sertraline (ZOLOFT) 50 MG tablet; Take 1 tablet (50 mg total) by mouth daily.  Dispense:  30 tablet; Refill: 1  3. Generalized anxiety disorder  - sertraline (ZOLOFT) 50 MG tablet; Take 1 tablet (50 mg total) by mouth daily.  Dispense: 30 tablet; Refill: 1  Patient to follow up in 4 weeks Provider spent a total of 18 minutes with the patient/reviewing the patient chart  Malachy Mood, PA 11/22/2021, 7:12 PM

## 2021-11-27 DIAGNOSIS — Z20822 Contact with and (suspected) exposure to covid-19: Secondary | ICD-10-CM | POA: Diagnosis not present

## 2021-12-19 DIAGNOSIS — Z20822 Contact with and (suspected) exposure to covid-19: Secondary | ICD-10-CM | POA: Diagnosis not present

## 2021-12-27 ENCOUNTER — Encounter (HOSPITAL_COMMUNITY): Payer: Self-pay | Admitting: Physician Assistant

## 2021-12-27 ENCOUNTER — Ambulatory Visit (INDEPENDENT_AMBULATORY_CARE_PROVIDER_SITE_OTHER): Payer: Medicaid Other | Admitting: Physician Assistant

## 2021-12-27 DIAGNOSIS — F411 Generalized anxiety disorder: Secondary | ICD-10-CM

## 2021-12-27 DIAGNOSIS — G4700 Insomnia, unspecified: Secondary | ICD-10-CM

## 2021-12-27 DIAGNOSIS — F331 Major depressive disorder, recurrent, moderate: Secondary | ICD-10-CM

## 2021-12-27 MED ORDER — TRAZODONE HCL 100 MG PO TABS: 100.0000 mg | ORAL_TABLET | Freq: Every day | ORAL | 1 refills | Status: DC

## 2021-12-27 MED ORDER — SERTRALINE HCL 100 MG PO TABS: 100.0000 mg | ORAL_TABLET | Freq: Every day | ORAL | 1 refills | Status: DC

## 2021-12-27 NOTE — Progress Notes (Addendum)
BH MD/PA/NP OP Progress Note ? ?12/27/2021 9:47 PM ?Sara Manning  ?MRN:  [id number] ? ?Chief Complaint:  ?Chief Complaint  ?Patient presents with  ? Medication Management  ?  F/U MM  ? ?HPI:  ? ?[name] "[name]" is a 36 year old female with a past psychiatric history significant for insomnia, major depressive disorder, and generalized anxiety disorder who presents to [facility] for follow-up and medication management.  Patient is currently being managed on the following medications: ? ?Trazodone 50 mg at bedtime ?Sertraline 50 mg daily ? ?Patient reports that she has been experiencing some nausea when taking her sertraline.  She describes her experience with the medication as being so-so.  Patient continues to endorse depressive episodes all throughout the day and occasionally goes off on people 3-4 times a day.  She does report that she does not feel as aggressive but still endorses some irritability with people.  She denies irritability when at home and states that her mood is more manageable.  Patient continues to endorse anxiety accompanied by really bad panic attacks.  Patient's panic attacks are characterized by the following symptom: heart beating out of his chest.  Patient denies any discernible triggers to her panic attacks. ? ?Patient reports that she continues to wake up periodically during the night.  Patient states that she mainly wakes up to check on her kids.  Patient states that she also occasionally sleeps in her armchair.  Patient endorses the following depressive symptoms: lack of energy and no motivation to do things.  A PHQ-9 screen was performed with the patient scoring a 21.  A GAD-7 screen was also performed with the patient scoring a 20. ? ?Patient is alert and oriented x4, calm, cooperative, and fully engaged in conversation during the encounter.  Patient endorses so-so mood but states that she is doing mostly good.  Patient denies suicidal or  homicidal ideations.  She further denies auditory or visual hallucinations and does not appear to be responding to internal/external stimuli.  Patient endorses poor sleep and receives on average 3 to 4 hours of sleep each night characterized by waking up intermittently.  Patient endorses decreased appetite and eats on average 1 meal per day.  Patient denies alcohol consumption, tobacco use, and illicit drug use. ? ?Visit Diagnosis:  ?  ICD-10-CM   ?1. Insomnia, unspecified type  G47.00 traZODone (DESYREL) 100 MG tablet  ?  ?2. Moderate episode of recurrent major depressive disorder (HCC)  F33.1 sertraline (ZOLOFT) 100 MG tablet  ?  ?3. Generalized anxiety disorder  F41.1 sertraline (ZOLOFT) 100 MG tablet  ?  ? ? ?Past Psychiatric History:  ?Insomnia ?Major depressive disorder ?Generalized anxiety disorder ?  ?Patient reports that she was diagnosed with Bipolar disorder in 2012. Patient was seen at [facility] practice at the time of diagnosis. ? ?Past Medical History:  ?Past Medical History:  ?Diagnosis Date  ? Bipolar 2 disorder ([place])   ? Headache(784.0)   ? Mood change 11/06/2019  ?  ?Past Surgical History:  ?Procedure Laterality Date  ? WISDOM TOOTH EXTRACTION    ? ? ?Family Psychiatric History:  ?Mother - Bipolar disorder ?Sister - Schizophrenia ? ?Family History:  ?Family History  ?Problem Relation Age of Onset  ? Bipolar disorder Mother   ? Diabetes Mother   ? Bipolar disorder Sister   ? Schizophrenia Sister   ? ? ?Social History:  ?Social History  ? ?Socioeconomic History  ? Marital status: Single  ?  Spouse name: Not on file  ? Number of children: Not on file  ? Years of education: Not on file  ? Highest education level: Not on file  ?Occupational History  ? Not on file  ?Tobacco Use  ? Smoking status: Never  ? Smokeless tobacco: Never  ?Vaping Use  ? Vaping Use: Never used  ?Substance and Sexual Activity  ? Alcohol use: No  ? Drug use: No  ? Sexual activity: Yes  ?  Birth control/protection: None  ?Other  Topics Concern  ? Not on file  ?Social History Narrative  ? Not on file  ? ?Social Determinants of Health  ? ?Financial Resource Strain: Not on file  ?Food Insecurity: No Food Insecurity  ? Worried About Charity fundraiser in the Last Year: Never true  ? Ran Out of Food in the Last Year: Never true  ?Transportation Needs: No Transportation Needs  ? Lack of Transportation (Medical): No  ? Lack of Transportation (Non-Medical): No  ?Physical Activity: Not on file  ?Stress: Not on file  ?Social Connections: Not on file  ? ? ?Allergies: No Known Allergies ? ?Metabolic Disorder Labs: ?Lab Results  ?Component Value Date  ? HGBA1C 5.0 08/20/2021  ? MPG 96.8 08/20/2021  ? ?No results found for: PROLACTIN ?Lab Results  ?Component Value Date  ? CHOL 180 08/20/2021  ? TRIG 67 08/20/2021  ? HDL 63 08/20/2021  ? CHOLHDL 2.9 08/20/2021  ? VLDL 13 08/20/2021  ? LDLCALC 104 (H) 08/20/2021  ? ?Lab Results  ?Component Value Date  ? TSH 2.478 08/20/2021  ? TSH 1.160 08/21/2018  ? ? ?Therapeutic Level Labs: ?No results found for: LITHIUM ?No results found for: VALPROATE ?No components found for:  CBMZ ? ?Current Medications: ?Current Outpatient Medications  ?Medication Sig Dispense Refill  ? Blood Pressure KIT Use weekly 1 kit 0  ? famotidine (PEPCID) 20 MG tablet Take 1 tablet (20 mg total) by mouth daily. 30 tablet 0  ? hydrocortisone 2.5 % cream Apply topically 2 (two) times daily. (Patient taking differently: Apply 1 application. topically daily as needed (For eczma).) 40 g 0  ? hydrOXYzine (ATARAX) 10 MG tablet TAKE 1 TABLET BY MOUTH THREE TIMES A DAY AS NEEDED 75 tablet 1  ? ibuprofen (ADVIL) 400 MG tablet Take 1 tablet (400 mg total) by mouth every 6 (six) hours as needed. 30 tablet 0  ? lidocaine (LIDODERM) 5 % Place 1 patch onto the skin daily. Remove & Discard patch within 12 hours or as directed by MD 10 patch 0  ? Prenatal Vit-Fe Fumarate-FA ([employer] PRENATAL) 27-0.8 MG TABS Take 1 tablet by mouth daily. 60 tablet 3  ?  topiramate (TOPAMAX) 25 MG tablet Take 3 tablets (75 mg total) by mouth at bedtime. 30 tablet 0  ? sertraline (ZOLOFT) 100 MG tablet Take 1 tablet (100 mg total) by mouth daily. 30 tablet 1  ? traZODone (DESYREL) 100 MG tablet Take 1 tablet (100 mg total) by mouth at bedtime. 30 tablet 1  ? ?No current facility-administered medications for this visit.  ? ? ? ?Musculoskeletal: ?Strength & Muscle Tone: within normal limits ?Gait & Station: normal ?Patient leans: N/A ? ?Psychiatric Specialty Exam: ?Review of Systems  ?Psychiatric/Behavioral:  Positive for sleep disturbance. Negative for decreased concentration, dysphoric mood, hallucinations, self-injury and suicidal ideas. The patient is nervous/anxious. The patient is not hyperactive.    ?Blood pressure 113/73, pulse 73, height $RemoveBe'5\' 7"'vQuOiaHMh$  (1.702 m), weight 193 lb (87.5 kg), not currently breastfeeding.Body  mass index is 30.23 kg/m?.  ?General Appearance: Casual  ?Eye Contact:  Good  ?Speech:  Clear and Coherent and Normal Rate  ?Volume:  Normal  ?Mood:  Anxious and Depressed  ?Affect:  Congruent and Depressed  ?Thought Process:  Coherent, Goal Directed, and Descriptions of Associations: Intact  ?Orientation:  Full (Time, Place, and Person)  ?Thought Content: WDL   ?Suicidal Thoughts:  No  ?Homicidal Thoughts:  No  ?Memory:  Immediate;   Good ?Recent;   Good ?Remote;   Good  ?Judgement:  Good  ?Insight:  Good  ?Psychomotor Activity:  Normal  ?Concentration:  Concentration: Good and Attention Span: Good  ?Recall:  Good  ?Fund of Knowledge: Good  ?Language: Good  ?Akathisia:  No  ?Handed:  Right  ?AIMS (if indicated): not done  ?Assets:  Communication Skills ?Desire for Improvement ?Housing  ?ADL's:  Intact  ?Cognition: WNL  ?Sleep:  Poor  ? ?Screenings: ?GAD-7   ? ?[place] Office Visit from 12/27/2021 in [facility] Office Visit from 11/22/2021 in [facility] Office Visit from 10/03/2021 in [facility] Counselor from 09/25/2021 in [place] from 07/25/2021 in [place] for [place] at [facility]  ?Total GAD-7

## 2022-01-05 ENCOUNTER — Other Ambulatory Visit (HOSPITAL_COMMUNITY): Payer: Self-pay | Admitting: Physician Assistant

## 2022-01-05 DIAGNOSIS — F411 Generalized anxiety disorder: Secondary | ICD-10-CM

## 2022-01-18 DIAGNOSIS — Z20822 Contact with and (suspected) exposure to covid-19: Secondary | ICD-10-CM | POA: Diagnosis not present

## 2022-02-08 ENCOUNTER — Encounter (HOSPITAL_COMMUNITY): Payer: Medicaid Other | Admitting: Physician Assistant

## 2022-02-20 ENCOUNTER — Encounter: Payer: Self-pay | Admitting: *Deleted

## 2022-03-02 ENCOUNTER — Telehealth (INDEPENDENT_AMBULATORY_CARE_PROVIDER_SITE_OTHER): Payer: Medicaid Other | Admitting: Physician Assistant

## 2022-03-02 DIAGNOSIS — F411 Generalized anxiety disorder: Secondary | ICD-10-CM | POA: Diagnosis not present

## 2022-03-02 DIAGNOSIS — G4700 Insomnia, unspecified: Secondary | ICD-10-CM | POA: Diagnosis not present

## 2022-03-02 DIAGNOSIS — F331 Major depressive disorder, recurrent, moderate: Secondary | ICD-10-CM | POA: Diagnosis not present

## 2022-03-02 MED ORDER — BUSPIRONE HCL 5 MG PO TABS: 5.0000 mg | ORAL_TABLET | Freq: Two times a day (BID) | ORAL | 2 refills | Status: DC

## 2022-03-02 MED ORDER — MIRTAZAPINE 7.5 MG PO TABS: 7.5000 mg | ORAL_TABLET | Freq: Every day | ORAL | 2 refills | Status: DC

## 2022-03-06 ENCOUNTER — Encounter (HOSPITAL_COMMUNITY): Payer: Self-pay | Admitting: Physician Assistant

## 2022-03-06 NOTE — Progress Notes (Signed)
[place] MD/PA/NP OP Progress Note  Virtual Visit via Telephone Note  I connected with Sara Manning on 03/06/22 at  9:00 AM EDT by telephone and verified that I am speaking with the correct person using two identifiers.  Location: Patient: Home Provider: Clinic   I discussed the limitations, risks, security and privacy concerns of performing an evaluation and management service by telephone and the availability of in person appointments. I also discussed with the patient that there may be a patient responsible charge related to this service. The patient expressed understanding and agreed to proceed.  Follow Up Instructions:  I discussed the assessment and treatment plan with the patient. The patient was provided an opportunity to ask questions and all were answered. The patient agreed with the plan and demonstrated an understanding of the instructions.   The patient was advised to call back or seek an in-person evaluation if the symptoms worsen or if the condition fails to improve as anticipated.  I provided 17 minutes of non-face-to-face time during this encounter.  Malachy Mood, PA    03/06/2022 10:30 AM Sara Manning  MRN:  [id number]  Chief Complaint:  No chief complaint on file.  HPI:   Sara Manning "Sara Manning" is a 36 year old female with a past psychiatric history significant for insomnia, major depressive disorder, and generalized anxiety disorder who presents to [facility] for follow-up and medication management.  Patient is currently being managed on the following medications:  Trazodone 100 mg at bedtime Sertraline 100 mg daily  Patient reports that she has been experiencing vomiting when taking her pills and has discontinued taking her medications.  She reports that she occasionally takes her pills at the same time but believes that she has been getting sick from her trazodone prescriptions.  Patient reports that she last took  her prescriptions 2 weeks ago.  Since discontinuing her medications, her depression has remained the same and has not improved.  Patient endorses irritability all the time.  Patient endorses occasional racing thoughts and mood swings.  Patient endorses anxiety and rates her anxiety past a 10.  Patient denies any new stressors at this time.  A PHQ-9 screen was performed with the patient scoring a 22.  A GAD-7 screen was also performed with the patient scoring a 21.  Patient is alert and oriented x4, calm, cooperative, and fully engaged in conversation during the encounter.  Patient endorses so-so mood.  She endorses fluctuating mood stating that she can go from good to snappy in a matter of minutes.  Patient denies suicidal or homicidal ideations.  She further denies auditory or visual hallucinations and does not appear to be responding to internal/external stimuli.  Patient endorses poor sleep and receives 2 to 3 hours of intermittent sleep at night.  Patient endorses decreased appetite and eats on average 1-1-1/2 meals per day.  Patient denies alcohol consumption, tobacco use, and illicit drug use.  Visit Diagnosis:    ICD-10-CM   1. Moderate episode of recurrent major depressive disorder (HCC)  F33.1 mirtazapine (REMERON) 7.5 MG tablet    2. Generalized anxiety disorder  F41.1 busPIRone (BUSPAR) 5 MG tablet    3. Insomnia, unspecified type  G47.00       Past Psychiatric History:  Insomnia Major depressive disorder Generalized anxiety disorder   Patient reports that she was diagnosed with Bipolar disorder in 2012. Patient was seen at [facility] practice at the time of diagnosis.  Past Medical History:  Past Medical History:  Diagnosis Date   Bipolar 2 disorder ([place])    Headache(784.0)    Mood change 11/06/2019    Past Surgical History:  Procedure Laterality Date   WISDOM TOOTH EXTRACTION      Family Psychiatric History:  Mother - Bipolar disorder Sister - Schizophrenia  Family  History:  Family History  Problem Relation Age of Onset   Bipolar disorder Mother    Diabetes Mother    Bipolar disorder Sister    Schizophrenia Sister     Social History:  Social History   Socioeconomic History   Marital status: Single    Spouse name: Not on file   Number of children: Not on file   Years of education: Not on file   Highest education level: Not on file  Occupational History   Not on file  Tobacco Use   Smoking status: Never   Smokeless tobacco: Never  Vaping Use   Vaping Use: Never used  Substance and Sexual Activity   Alcohol use: No   Drug use: No   Sexual activity: Yes    Birth control/protection: None  Other Topics Concern   Not on file  Social History Narrative   Not on file   Social Determinants of Health   Financial Resource Strain: Not on file  Food Insecurity: No Food Insecurity (06/09/2021)   Hunger Vital Sign    Worried About Running Out of Food in the Last Year: Never true    Ran Out of Food in the Last Year: Never true  Transportation Needs: No Transportation Needs (06/09/2021)   PRAPARE - Hydrologist (Medical): No    Lack of Transportation (Non-Medical): No  Physical Activity: Not on file  Stress: Not on file  Social Connections: Not on file    Allergies: No Known Allergies  Metabolic Disorder Labs: Lab Results  Component Value Date   HGBA1C 5.0 08/20/2021   MPG 96.8 08/20/2021   No results found for: "PROLACTIN" Lab Results  Component Value Date   CHOL 180 08/20/2021   TRIG 67 08/20/2021   HDL 63 08/20/2021   CHOLHDL 2.9 08/20/2021   VLDL 13 08/20/2021   LDLCALC 104 (H) 08/20/2021   Lab Results  Component Value Date   TSH 2.478 08/20/2021   TSH 1.160 08/21/2018    Therapeutic Level Labs: No results found for: "LITHIUM" No results found for: "VALPROATE" No results found for: "CBMZ"  Current Medications: Current Outpatient Medications  Medication Sig Dispense Refill   busPIRone  (BUSPAR) 5 MG tablet Take 1 tablet (5 mg total) by mouth 2 (two) times daily. 60 tablet 2   mirtazapine (REMERON) 7.5 MG tablet Take 1 tablet (7.5 mg total) by mouth at bedtime. 30 tablet 2   Blood Pressure KIT Use weekly 1 kit 0   famotidine (PEPCID) 20 MG tablet Take 1 tablet (20 mg total) by mouth daily. 30 tablet 0   hydrocortisone 2.5 % cream Apply topically 2 (two) times daily. (Patient taking differently: Apply 1 application. topically daily as needed (For eczma).) 40 g 0   hydrOXYzine (ATARAX) 10 MG tablet TAKE 1 TABLET BY MOUTH THREE TIMES A DAY AS NEEDED 75 tablet 1   ibuprofen (ADVIL) 400 MG tablet Take 1 tablet (400 mg total) by mouth every 6 (six) hours as needed. 30 tablet 0   lidocaine (LIDODERM) 5 % Place 1 patch onto the skin daily. Remove & Discard patch within 12 hours or as directed by MD  10 patch 0   Prenatal Vit-Fe Fumarate-FA ([employer] PRENATAL) 27-0.8 MG TABS Take 1 tablet by mouth daily. 60 tablet 3   topiramate (TOPAMAX) 25 MG tablet Take 3 tablets (75 mg total) by mouth at bedtime. 30 tablet 0   No current facility-administered medications for this visit.     Musculoskeletal: Strength & Muscle Tone: within normal limits Gait & Station: normal Patient leans: N/A  Psychiatric Specialty Exam: Review of Systems  Psychiatric/Behavioral:  Positive for sleep disturbance. Negative for decreased concentration, dysphoric mood, hallucinations, self-injury and suicidal ideas. The patient is nervous/anxious. The patient is not hyperactive.     not currently breastfeeding.There is no height or weight on file to calculate BMI.  General Appearance: Casual  Eye Contact:  Good  Speech:  Clear and Coherent and Normal Rate  Volume:  Normal  Mood:  Anxious and Depressed  Affect:  Congruent and Depressed  Thought Process:  Coherent, Goal Directed, and Descriptions of Associations: Intact  Orientation:  Full (Time, Place, and Person)  Thought Content: WDL   Suicidal Thoughts:  No   Homicidal Thoughts:  No  Memory:  Immediate;   Good Recent;   Good Remote;   Good  Judgement:  Good  Insight:  Good  Psychomotor Activity:  Normal  Concentration:  Concentration: Good and Attention Span: Good  Recall:  Good  Fund of Knowledge: Good  Language: Good  Akathisia:  No  Handed:  Right  AIMS (if indicated): not done  Assets:  Communication Skills Desire for Improvement Housing  ADL's:  Intact  Cognition: WNL  Sleep:  Poor   Screenings: GAD-7    Flowsheet Row Video Visit from 03/02/2022 in [facility] Office Visit from 12/27/2021 in [facility] Office Visit from 11/22/2021 in [facility] Office Visit from 10/03/2021 in [facility] Counselor from 09/25/2021 in [facility]  Total GAD-7 Score _0 PHQ2-9    Flowsheet Row Video Visit from 03/02/2022 in [facility] Office Visit from 12/27/2021 in [facility] Office Visit from 11/22/2021 in [facility] Office Visit from 10/03/2021 in [facility] Counselor from 09/25/2021 in [place]  PHQ-2 Total Score _1 PHQ-9 Total Score _2 Flowsheet Row Video Visit from 03/02/2022 in [facility] Office Visit from 12/27/2021 in [facility] Office Visit from 11/22/2021 in [place] No Risk        Assessment and Plan:   Sara Bennis. [place] "Sara Manning" is a 36 year old female with a past psychiatric history significant for insomnia, major depressive disorder, and generalized anxiety disorder who presents to [facility] for follow-up and medication management.  Patient  reports that she experienced vomiting when taking her medications and has not been taking her medications for the last 2 weeks.  Provider recommended patient be placed on mirtazapine 7.5 mg at bedtime for the management of her depressive symptoms and anxiety.  Patient was also recommended buspirone 5 mg 2 times daily for the management of her anxiety.  Patient was agreeable to recommendations.  Patient's medications to be e-prescribed to pharmacy of choice.  Collaboration of Care: Collaboration of Care:  Medication Management AEB provider managing patient's psychiatric medications, Primary Care Provider AEB patient being followed by her primary care provider, and Psychiatrist AEB patient being followed by mental health provider  Patient/Guardian was advised Release of Information must be obtained prior to any record release in order to collaborate their care with an outside provider. Patient/Guardian was advised if they have not already done so to contact the registration department to sign all necessary forms in order for [place] to release information regarding their care.   Consent: Patient/Guardian gives verbal consent for treatment and assignment of benefits for services provided during this visit. Patient/Guardian expressed understanding and agreed to proceed.   1. Moderate episode of recurrent major depressive disorder (HCC)  - mirtazapine (REMERON) 7.5 MG tablet; Take 1 tablet (7.5 mg total) by mouth at bedtime.  Dispense: 30 tablet; Refill: 2  2. Generalized anxiety disorder  - busPIRone (BUSPAR) 5 MG tablet; Take 1 tablet (5 mg total) by mouth 2 (two) times daily.  Dispense: 60 tablet; Refill: 2  3. Insomnia, unspecified type  Patient to follow up in 2 months Provider spent a total of 17 minutes with the patient/reviewing patient's chart  Malachy Mood, PA 03/06/2022, 10:30 AM

## 2022-05-02 ENCOUNTER — Encounter (HOSPITAL_COMMUNITY): Payer: Self-pay | Admitting: Physician Assistant

## 2022-05-02 ENCOUNTER — Ambulatory Visit (INDEPENDENT_AMBULATORY_CARE_PROVIDER_SITE_OTHER): Payer: Medicaid Other | Admitting: Physician Assistant

## 2022-05-02 VITALS — BP 123/90 | HR 77 | Ht 67.0 in | Wt 204.0 lb

## 2022-05-02 DIAGNOSIS — G4700 Insomnia, unspecified: Secondary | ICD-10-CM

## 2022-05-02 DIAGNOSIS — F411 Generalized anxiety disorder: Secondary | ICD-10-CM | POA: Diagnosis not present

## 2022-05-02 DIAGNOSIS — F331 Major depressive disorder, recurrent, moderate: Secondary | ICD-10-CM

## 2022-05-02 MED ORDER — MIRTAZAPINE 15 MG PO TABS: 15.0000 mg | ORAL_TABLET | Freq: Every day | ORAL | 2 refills | Status: DC

## 2022-05-02 MED ORDER — VENLAFAXINE HCL ER 37.5 MG PO CP24: 37.5000 mg | ORAL_CAPSULE | Freq: Every day | ORAL | 2 refills | Status: DC

## 2022-05-02 MED ORDER — BUSPIRONE HCL 7.5 MG PO TABS: 7.5000 mg | ORAL_TABLET | Freq: Two times a day (BID) | ORAL | 2 refills | Status: DC

## 2022-05-02 NOTE — Progress Notes (Signed)
[facility] MD/PA/NP OP Progress Note  05/03/2022 3:10 PM Sara Manning  MRN:  [id number]  Chief Complaint:  Chief Complaint  Patient presents with   Medication Management    HPI:   Sara Manning. Sara "[name]" is a 36 year old female with a past psychiatric history significant for insomnia, major depressive disorder, and generalized anxiety disorder who presents to [facility] for follow-up and medication management.  Patient is currently being managed on the following medications:  Mirtazapine 7.5 mg at bedtime Buspirone 5 mg 2 times daily  Patient reports that she has been having a better reaction to her current medications than the medications she was on previously.  Patient denies experiencing any side effects while on her current medication regimen.  She still continues to endorse depressive symptoms roughly 4-5 times per week.  Patient endorses the following depressive symptoms: lack of relation, feelings of sadness, irritability, decreased concentration, and fatigue.  Patient states that she often feels better when she is by herself.  In addition to depression, patient endorses anxiety and rates her anxiety an 8 out of 10.  Patient continues to stress over multiple aspects of her life.  A PHQ-9 screen was performed with the patient scoring a 21.  A GAD-7 screen was also performed with the patient scoring a 20.  Patient is alert and oriented x4, calm, cooperative, and fully engaged in conversation during the encounter.  Patient endorses good mood.  Patient denies suicidal or homicidal ideation.  She further denies auditory or visual hallucinations and does not appear to be responding to internal/external stimuli.  Patient endorses fair sleep and receives on average 5 to 6 hours of intermittent sleep.  Patient endorses decreased appetite and eats on average 1 meal per day.  Patient denies alcohol consumption, tobacco use, and illicit drug use.  Visit Diagnosis:     ICD-10-CM   1. Generalized anxiety disorder  F41.1 busPIRone (BUSPAR) 7.5 MG tablet    venlafaxine XR (EFFEXOR XR) 37.5 MG 24 hr capsule    2. Moderate episode of recurrent major depressive disorder (HCC)  F33.1 mirtazapine (REMERON) 15 MG tablet    venlafaxine XR (EFFEXOR XR) 37.5 MG 24 hr capsule      Past Psychiatric History:  Insomnia Major depressive disorder Generalized anxiety disorder   Patient reports that she was diagnosed with Bipolar disorder in 2012. Patient was seen at [facility] practice at the time of diagnosis.  Past Medical History:  Past Medical History:  Diagnosis Date   Bipolar 2 disorder ([place])    Headache(784.0)    Mood change 11/06/2019    Past Surgical History:  Procedure Laterality Date   WISDOM TOOTH EXTRACTION      Family Psychiatric History:  Mother - Bipolar disorder Sister - Schizophrenia  Family History:  Family History  Problem Relation Age of Onset   Bipolar disorder Mother    Diabetes Mother    Bipolar disorder Sister    Schizophrenia Sister     Social History:  Social History   Socioeconomic History   Marital status: Single    Spouse name: Not on file   Number of children: Not on file   Years of education: Not on file   Highest education level: Not on file  Occupational History   Not on file  Tobacco Use   Smoking status: Never   Smokeless tobacco: Never  Vaping Use   Vaping Use: Never used  Substance and Sexual Activity   Alcohol use: No  Drug use: No   Sexual activity: Yes    Birth control/protection: None  Other Topics Concern   Not on file  Social History Narrative   Not on file   Social Determinants of Health   Financial Resource Strain: Not on file  Food Insecurity: No Food Insecurity (06/09/2021)   Hunger Vital Sign    Worried About Running Out of Food in the Last Year: Never true    Ran Out of Food in the Last Year: Never true  Transportation Needs: No Transportation Needs (06/09/2021)   PRAPARE -  Hydrologist (Medical): No    Lack of Transportation (Non-Medical): No  Physical Activity: Not on file  Stress: Not on file  Social Connections: Not on file    Allergies: No Known Allergies  Metabolic Disorder Labs: Lab Results  Component Value Date   HGBA1C 5.0 08/20/2021   MPG 96.8 08/20/2021   No results found for: "PROLACTIN" Lab Results  Component Value Date   CHOL 180 08/20/2021   TRIG 67 08/20/2021   HDL 63 08/20/2021   CHOLHDL 2.9 08/20/2021   VLDL 13 08/20/2021   LDLCALC 104 (H) 08/20/2021   Lab Results  Component Value Date   TSH 2.478 08/20/2021   TSH 1.160 08/21/2018    Therapeutic Level Labs: No results found for: "LITHIUM" No results found for: "VALPROATE" No results found for: "CBMZ"  Current Medications: Current Outpatient Medications  Medication Sig Dispense Refill   venlafaxine XR (EFFEXOR XR) 37.5 MG 24 hr capsule Take 1 capsule (37.5 mg total) by mouth daily. 30 capsule 2   Blood Pressure KIT Use weekly 1 kit 0   busPIRone (BUSPAR) 7.5 MG tablet Take 1 tablet (7.5 mg total) by mouth 2 (two) times daily. 60 tablet 2   famotidine (PEPCID) 20 MG tablet Take 1 tablet (20 mg total) by mouth daily. 30 tablet 0   hydrocortisone 2.5 % cream Apply topically 2 (two) times daily. (Patient taking differently: Apply 1 application. topically daily as needed (For eczma).) 40 g 0   hydrOXYzine (ATARAX) 10 MG tablet TAKE 1 TABLET BY MOUTH THREE TIMES A DAY AS NEEDED 75 tablet 1   ibuprofen (ADVIL) 400 MG tablet Take 1 tablet (400 mg total) by mouth every 6 (six) hours as needed. 30 tablet 0   lidocaine (LIDODERM) 5 % Place 1 patch onto the skin daily. Remove & Discard patch within 12 hours or as directed by MD 10 patch 0   mirtazapine (REMERON) 15 MG tablet Take 1 tablet (15 mg total) by mouth at bedtime. 30 tablet 2   Prenatal Vit-Fe Fumarate-FA ([employer] PRENATAL) 27-0.8 MG TABS Take 1 tablet by mouth daily. 60 tablet 3   topiramate  (TOPAMAX) 25 MG tablet Take 3 tablets (75 mg total) by mouth at bedtime. 30 tablet 0   No current facility-administered medications for this visit.     Musculoskeletal: Strength & Muscle Tone: within normal limits Gait & Station: normal Patient leans: N/A  Psychiatric Specialty Exam: Review of Systems  Psychiatric/Behavioral:  Positive for sleep disturbance. Negative for decreased concentration, dysphoric mood, hallucinations, self-injury and suicidal ideas. The patient is nervous/anxious. The patient is not hyperactive.     Blood pressure (!) 123/90, pulse 77, height $RemoveBe'5\' 7"'zWzaLKwgl$  (1.702 m), weight 204 lb (92.5 kg), SpO2 100 %, not currently breastfeeding.Body mass index is 31.95 kg/m.  General Appearance: Casual  Eye Contact:  Good  Speech:  Clear and Coherent and Normal Rate  Volume:  Normal  Mood:  Anxious and Depressed  Affect:  Congruent and Depressed  Thought Process:  Coherent, Goal Directed, and Descriptions of Associations: Intact  Orientation:  Full (Time, Place, and Person)  Thought Content: WDL   Suicidal Thoughts:  No  Homicidal Thoughts:  No  Memory:  Immediate;   Good Recent;   Good Remote;   Good  Judgement:  Good  Insight:  Good  Psychomotor Activity:  Normal  Concentration:  Concentration: Good and Attention Span: Good  Recall:  Good  Fund of Knowledge: Good  Language: Good  Akathisia:  No  Handed:  Right  AIMS (if indicated): not done  Assets:  Communication Skills Desire for Improvement Housing  ADL's:  Intact  Cognition: WNL  Sleep:  Fair   Screenings: GAD-7    [place] Office Visit from 05/02/2022 in [facility] Video Visit from 03/02/2022 in [facility] Office Visit from 12/27/2021 in [facility] Office Visit from 11/22/2021 in [facility] Office Visit from 10/03/2021 in [facility]  Total GAD-7 Score $RemoveBef'20 21  20 20 21      'giKyBaiaOy$ PHQ2-9    [place] Office Visit from 05/02/2022 in [facility] Video Visit from 03/02/2022 in [facility] Office Visit from 12/27/2021 in [facility] Office Visit from 11/22/2021 in [facility] Office Visit from 10/03/2021 in [place]  PHQ-2 Total Score $RemoveBef'5 5 5 5 6  'ZNhDlrripw$ PHQ-9 Total Score $RemoveBef'21 22 21 21 24      'DiQQHRlNNa$ [place] Office Visit from 05/02/2022 in [facility] Video Visit from 03/02/2022 in [facility] Office Visit from 12/27/2021 in [place] No Risk Low Risk Low Risk        Assessment and Plan:   Sara Dignan. [place] "[name]" is a 36 year old female with a past psychiatric history significant for insomnia, major depressive disorder, and generalized anxiety disorder who presents to [facility] for follow-up and medication management.  Patient has been experiencing a better reaction to her current medication regimen.  She still continues to endorse depressive symptoms as well as elevated anxiety.  Provider recommended increasing her dosage of mirtazapine from 7.5 mg to 15 mg at bedtime for the management of her depressive symptoms and anxiety.  Patient was also recommended increasing her buspirone from 5 mg to 7.5 mg 2 times daily for the management of her anxiety.  Lastly, patient was recommended adding on venlafaxine 37.5 mg daily for the management of her depressive symptoms and anxiety.  Patient was agreeable to recommendations.  Patient's medications to be e-prescribed to pharmacy of choice.  Collaboration of Care: Collaboration of Care: Medication Management AEB provider managing patient's psychiatric medications, Primary Care Provider AEB patient being followed by her primary care provider,  and Psychiatrist AEB patient being followed by mental health provider  Patient/Guardian was advised Release of Information must be obtained prior to any record release in order to collaborate their care with an outside provider. Patient/Guardian was advised if they have not already done so to contact the registration department to sign all necessary forms in order for [place] to release information regarding their care.   Consent: Patient/Guardian gives verbal consent for treatment and assignment of benefits for services provided during this visit. Patient/Guardian expressed understanding  and agreed to proceed.   1. Generalized anxiety disorder  - busPIRone (BUSPAR) 7.5 MG tablet; Take 1 tablet (7.5 mg total) by mouth 2 (two) times daily.  Dispense: 60 tablet; Refill: 2 - venlafaxine XR (EFFEXOR XR) 37.5 MG 24 hr capsule; Take 1 capsule (37.5 mg total) by mouth daily.  Dispense: 30 capsule; Refill: 2  2. Moderate episode of recurrent major depressive disorder (HCC)  - mirtazapine (REMERON) 15 MG tablet; Take 1 tablet (15 mg total) by mouth at bedtime.  Dispense: 30 tablet; Refill: 2 - venlafaxine XR (EFFEXOR XR) 37.5 MG 24 hr capsule; Take 1 capsule (37.5 mg total) by mouth daily.  Dispense: 30 capsule; Refill: 2  3. Insomnia, unspecified type  Patient to follow up in 2 months Provider spent a total of 13 minutes with the patient/reviewing patient's chart  Malachy Mood, PA 05/03/2022, 3:10 PM

## 2022-05-03 ENCOUNTER — Encounter (HOSPITAL_COMMUNITY): Payer: Self-pay | Admitting: Physician Assistant

## 2022-06-30 ENCOUNTER — Other Ambulatory Visit (HOSPITAL_COMMUNITY): Payer: Self-pay | Admitting: Physician Assistant

## 2022-06-30 DIAGNOSIS — F331 Major depressive disorder, recurrent, moderate: Secondary | ICD-10-CM

## 2022-06-30 DIAGNOSIS — F411 Generalized anxiety disorder: Secondary | ICD-10-CM

## 2022-07-04 ENCOUNTER — Encounter (HOSPITAL_COMMUNITY): Payer: Medicaid Other | Admitting: Physician Assistant
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
## 2022-07-08 NOTE — Patient Instructions (Signed)
Thank you for attending your appointment today.  -- RESTART Effexor 37.5 mg (1 capsule) daily for 1-2 weeks then INCREASE to 75 mg (2 capsules) daily thereafter -- Do not restart other psychiatric medications at this time. -- Reach out to your neurologist or PCP for further evaluation and management of migraine headaches.   Please do not make any changes to medications without first discussing with your provider. If you are experiencing a psychiatric emergency, please call 911 or present to your nearest emergency department. Additional crisis, medication management, and therapy resources are included below.  [facility]  [address], [place], [place] [address] [phone] WALK-IN URGENT CARE 24/7 FOR ANYONE [address], [place], [place]  [phone] Fax: [phone] [email or web address] *Interpreters available *Accepts all insurance and uninsured for Urgent Care needs *Accepts Medicaid and uninsured for outpatient treatment (below)      ONLY FOR [facility]  Below:    Outpatient New Patient Assessment/Therapy Walk-ins:        Monday -Thursday 8am until slots are full.        Every Friday 1pm-4pm  (first come, first served)                   New Patient Psychiatry/Medication Management        Monday-Friday 8am-11am (first come, first served)               For all walk-ins we ask that you arrive by 7:15am, because patients will be seen in the order of arrival.

## 2022-07-09 ENCOUNTER — Telehealth (INDEPENDENT_AMBULATORY_CARE_PROVIDER_SITE_OTHER): Payer: Medicaid Other | Admitting: Psychiatry

## 2022-07-09 ENCOUNTER — Encounter (HOSPITAL_COMMUNITY): Payer: Self-pay | Admitting: Psychiatry

## 2022-07-09 DIAGNOSIS — G43101 Migraine with aura, not intractable, with status migrainosus: Secondary | ICD-10-CM | POA: Diagnosis not present

## 2022-07-09 DIAGNOSIS — F411 Generalized anxiety disorder: Secondary | ICD-10-CM | POA: Diagnosis not present

## 2022-07-09 DIAGNOSIS — F331 Major depressive disorder, recurrent, moderate: Secondary | ICD-10-CM

## 2022-07-09 DIAGNOSIS — G4700 Insomnia, unspecified: Secondary | ICD-10-CM

## 2022-07-11 DIAGNOSIS — Z1152 Encounter for screening for COVID-19: Secondary | ICD-10-CM | POA: Diagnosis not present

## 2022-07-25 DIAGNOSIS — Z1152 Encounter for screening for COVID-19: Secondary | ICD-10-CM | POA: Diagnosis not present
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
## 2022-08-26 NOTE — Patient Instructions (Signed)
Thank you for attending your appointment today.  -- START Remeron 7.5 mg nightly; make sure to take the nightly so we can assess its full benefit -- STOP all other psychiatric medications at this time to provide better clarity on what is/is not helpful  Please do not make any changes to medications without first discussing with your provider. If you are experiencing a psychiatric emergency, please call 911 or present to your nearest emergency department. Additional crisis, medication management, and therapy resources are included below.  [facility]  [address], [place], [place] [address] [phone] WALK-IN URGENT CARE 24/7 FOR ANYONE [address], [place], [place]  [phone] Fax: [phone] [email or web address] *Interpreters available *Accepts all insurance and uninsured for Urgent Care needs *Accepts Medicaid and uninsured for outpatient treatment (below)      ONLY FOR [facility]  Below:    Outpatient New Patient Assessment/Therapy Walk-ins:        Monday -Thursday 8am until slots are full.        Every Friday 1pm-4pm  (first come, first served)                   New Patient Psychiatry/Medication Management        Monday-Friday 8am-11am (first come, first served)               For all walk-ins we ask that you arrive by 7:15am, because patients will be seen in the order of arrival.

## 2022-08-27 ENCOUNTER — Encounter (HOSPITAL_COMMUNITY): Payer: Self-pay | Admitting: Psychiatry

## 2022-08-27 ENCOUNTER — Telehealth (INDEPENDENT_AMBULATORY_CARE_PROVIDER_SITE_OTHER): Payer: Medicaid Other | Admitting: Psychiatry

## 2022-08-27 DIAGNOSIS — G4700 Insomnia, unspecified: Secondary | ICD-10-CM

## 2022-08-27 DIAGNOSIS — F331 Major depressive disorder, recurrent, moderate: Secondary | ICD-10-CM

## 2022-08-27 DIAGNOSIS — F411 Generalized anxiety disorder: Secondary | ICD-10-CM | POA: Diagnosis not present

## 2022-08-27 MED ORDER — MIRTAZAPINE 7.5 MG PO TABS: 7.5000 mg | ORAL_TABLET | Freq: Every evening | ORAL | 2 refills | Status: DC

## 2022-08-28 IMAGING — US US OB EACH ADDL GEST<[ID]
1 series · 15 of 28 positions shown · non-contrast
Comparison: None.

CLINICAL DATA: Unsure of LMP.

EXAM:
TWIN OBSTETRICAL ULTRASOUND <14 WKS

[Series 1: us ob each addl gest<(id) · 82 acquisitions, 15 frames shown]
[im 1/82]
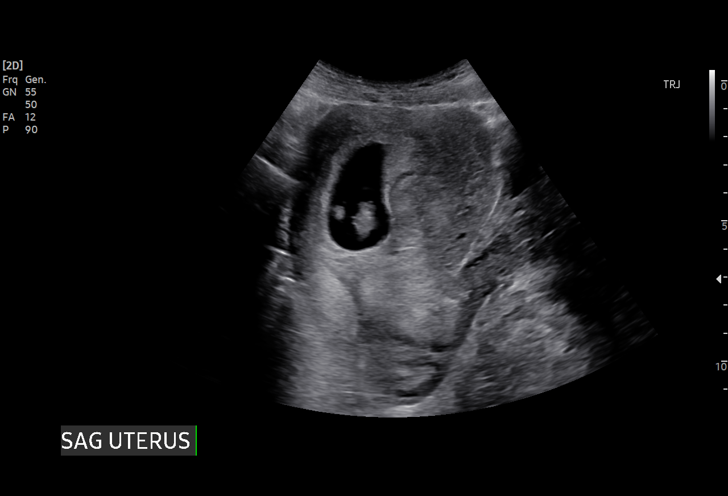
[im 7/82]
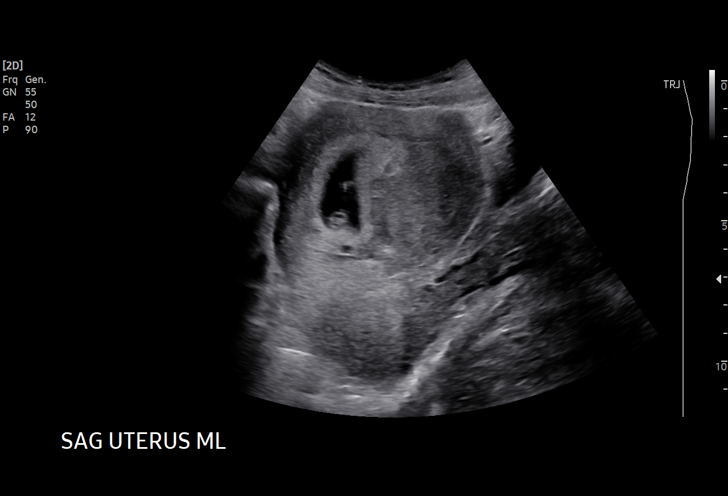
[im 13/82]
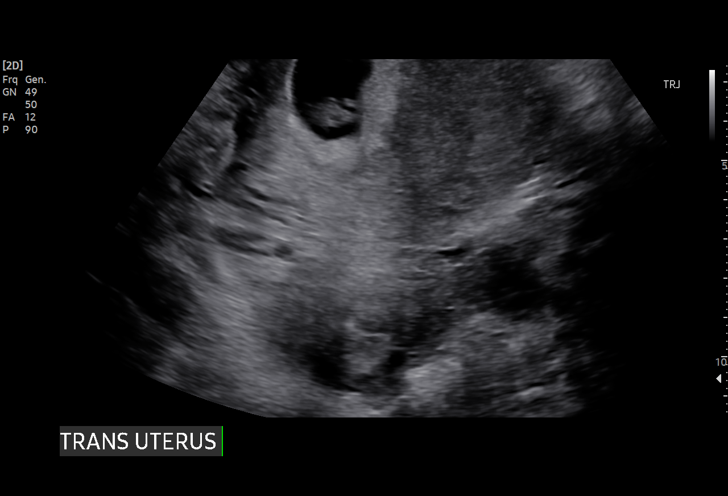
[im 19/82]
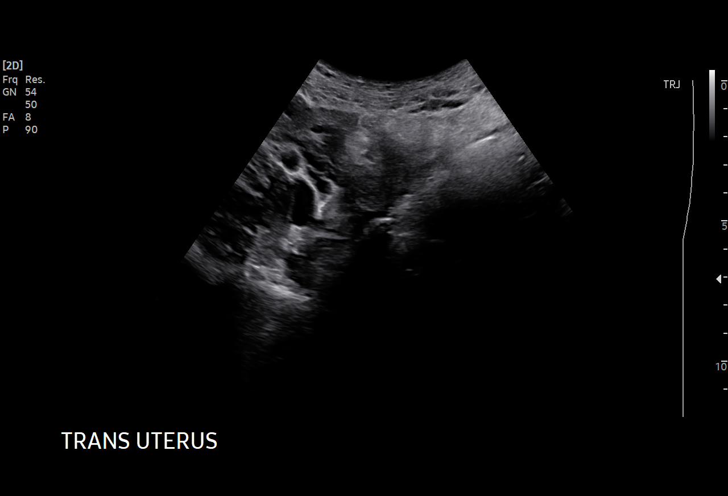
[im 25/82]
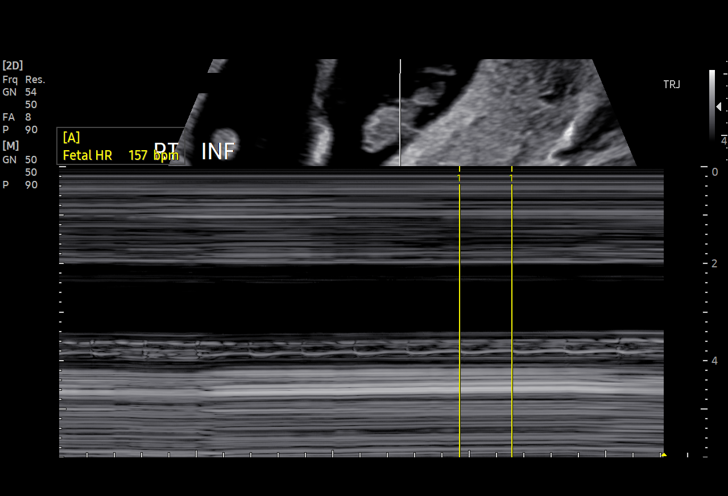
[im 31/82]
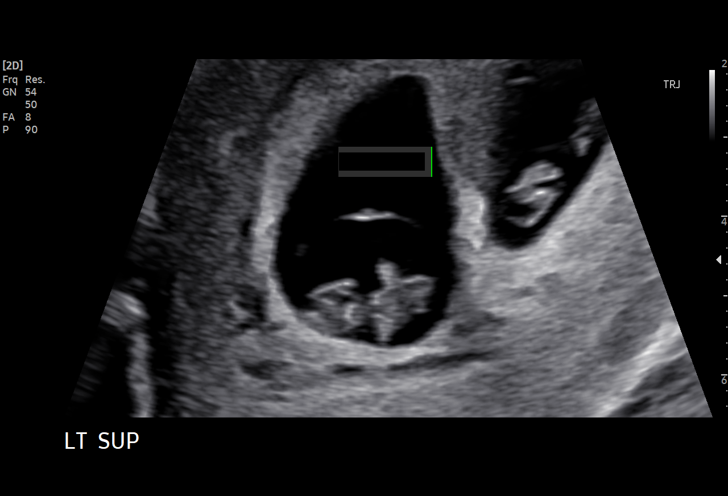
[im 37/82]
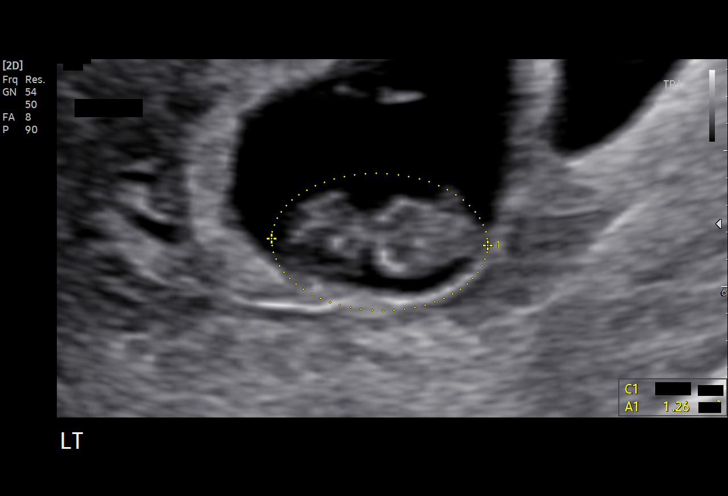
[im 43/82]
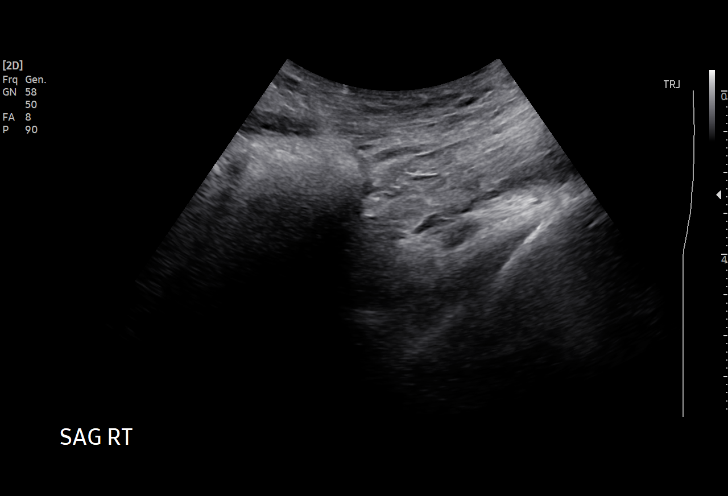
[im 46/82]
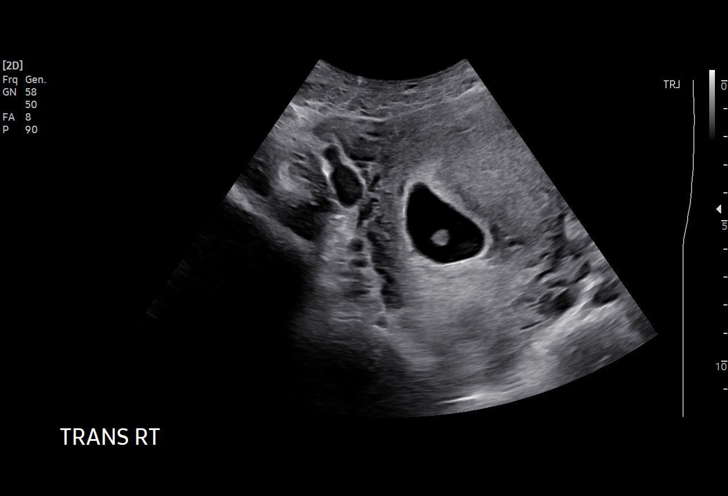
[im 52/82]
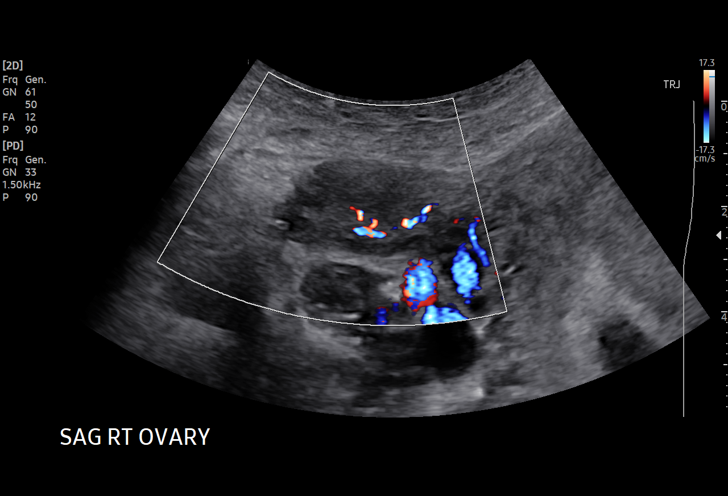
[im 58/82]
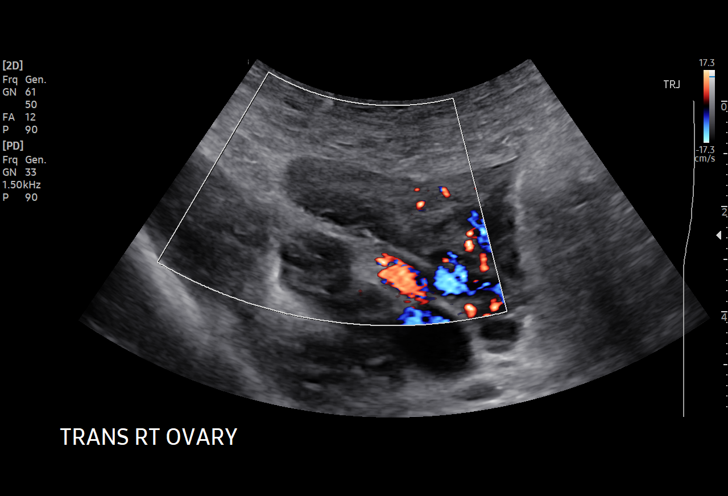
[im 64/82]
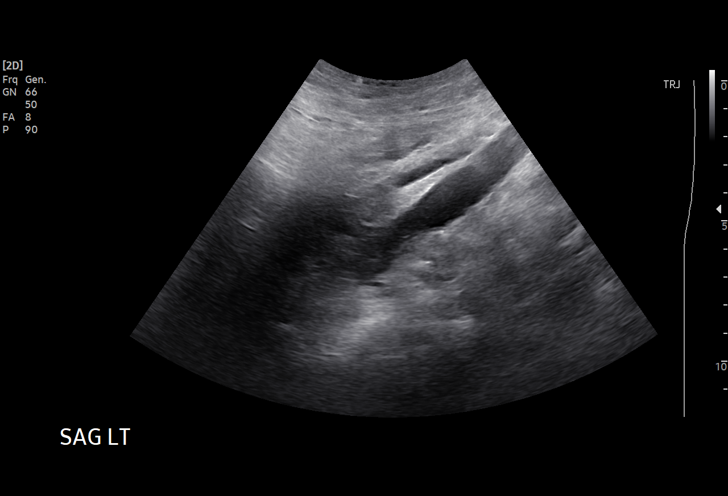
[im 70/82]
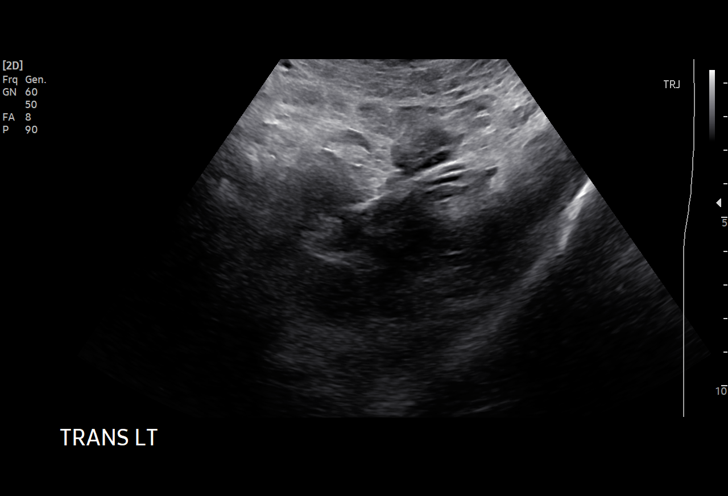
[im 76/82]
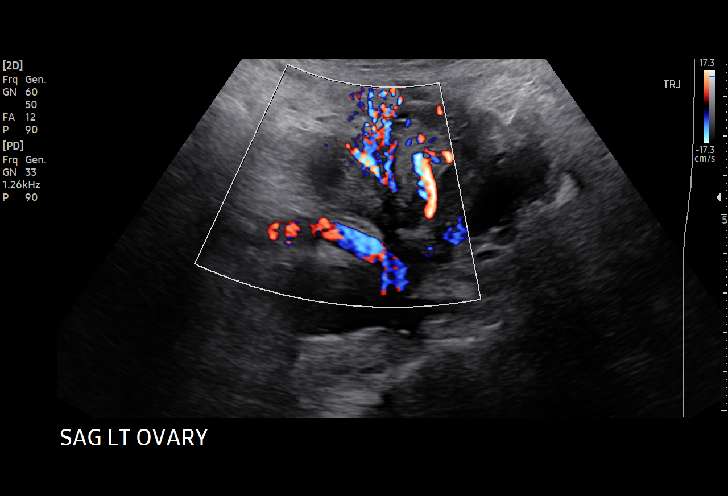
[im 82/82]
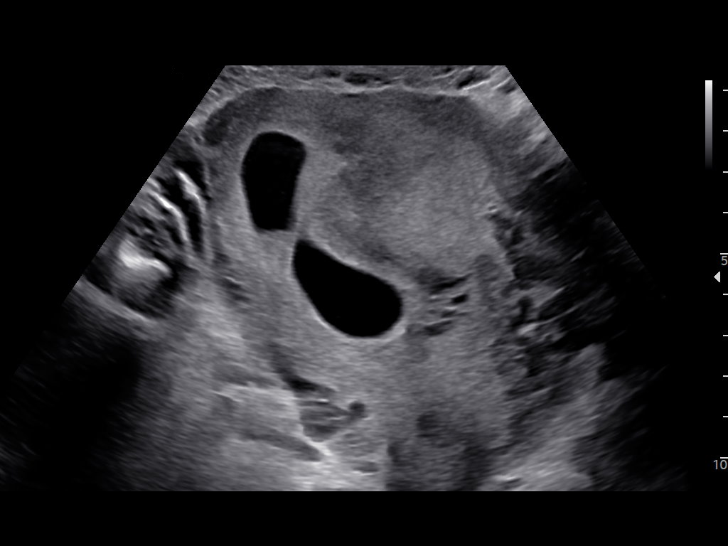

[15 of 28 positions shown; findings below may reference images not displayed]

FINDINGS: Number of IUPs:  2

Chorionicity/Amnionicity:  Dichorionic-diamniotic (thick membrane)

TWIN 1

Yolk sac:  Visualized.

Embryo:  Visualized.

Cardiac Activity: Visualized.

Heart Rate: 157 bpm

CRL:   15 mm   7 w 6 d                  US EDC: 07/04/2021

TWIN 2

Yolk sac:  Visualized.

Embryo:  Visualized.

Cardiac Activity: Visualized.

Heart Rate: 157 bpm

CRL:   15 mm   7 w 6 d                  US EDC: 07/04/2021

Subchorionic hemorrhage:  None visualized.

Maternal uterus/adnexae: Both ovaries are normal in appearance. No
masses or abnormal free fluid identified.
IMPRESSION: Living dichorionic twin IUP, with estimated gestational age of 7
weeks 6 days and US EDC of 07/04/2021.

No maternal uterine or adnexal abnormality identified.

## 2022-12-11 ENCOUNTER — Encounter: Payer: Self-pay | Admitting: Family Medicine

## 2022-12-11 ENCOUNTER — Ambulatory Visit (INDEPENDENT_AMBULATORY_CARE_PROVIDER_SITE_OTHER): Payer: Medicaid Other | Admitting: Family Medicine

## 2022-12-11 VITALS — BP 118/75 | HR 93 | Ht 67.0 in | Wt 215.4 lb

## 2022-12-11 DIAGNOSIS — B359 Dermatophytosis, unspecified: Secondary | ICD-10-CM | POA: Diagnosis not present

## 2022-12-11 DIAGNOSIS — R21 Rash and other nonspecific skin eruption: Secondary | ICD-10-CM | POA: Diagnosis not present

## 2022-12-11 MED ORDER — KETOCONAZOLE 2 % EX CREA: 1.0000 | TOPICAL_CREAM | Freq: Every day | CUTANEOUS | 1 refills | Status: AC

## 2022-12-11 NOTE — Assessment & Plan Note (Addendum)
-  rash seems most consistent with tinea vesicolor. Also considered tinea corporis, eczema and onychomycosis as differentials -discussed the importance of moisturizing daily with vaseline -will trial ketoconazole cream daily for 6 weeks -follow up in 6 weeks with PCP if no improvement

## 2022-12-11 NOTE — Patient Instructions (Addendum)
It was great seeing you today!  Today we discussed the rash on your foot, it appears slightly fungal in nature so I have prescribed ketoconazole cream, please apply this once daily for 6 weeks. Please make sure to follow up in 6 weeks if it has not improved.   Please follow up at your next scheduled appointment, if anything arises between now and then, please don't hesitate to contact our office.   Thank you for allowing [place] to be a part of your medical care!  Thank you, Dr. Larae Grooms  Also a reminder of our clinic's no-show policy. Please make sure to arrive at least 15 minutes prior to your scheduled appointment time. Please try to cancel before 24 hours if you are not able to make it. If you no-show for 2 appointments then you will be receiving a warning letter. If you no-show after 3 visits, then you may be at risk of being dismissed from our clinic. This is to ensure that everyone is able to be seen in a timely manner. Thank you, we appreciate your assistance with this!

## 2022-12-11 NOTE — Progress Notes (Signed)
    SUBJECTIVE:   CHIEF COMPLAINT / HPI:   Patient presents with concern of dark spots on her foot, this started a few months ago. She did not notice initially. Denies fever, chills and pain. Denies any wounds or ulcers along her foot. Does not remember events surrounding when the rash first developed. This seems to be on both feet with the left worse than the right. She has not tried anything for it. They do not seem to be bothersome and do not affect her gait.   OBJECTIVE:   BP 118/75   Pulse 93   Ht 5\' 7"  (1.702 m)   Wt 215 lb 6 oz (97.7 kg)   SpO2 98%   BMI 33.73 kg/m   General: Patient well-appearing, in no acute distress. Resp: normal work of breathing noted Derm: hyperpigmented patches along dorsal aspect of both feet bilaterally with left >right  ASSESSMENT/PLAN:   Tinea -rash seems most consistent with tinea vesicolor. Also considered tinea corporis, eczema and onychomycosis as differentials -discussed the importance of moisturizing daily with vaseline -will trial ketoconazole cream daily for 6 weeks -follow up in 6 weeks with PCP if no improvement     Laderrick Wilk Larae Grooms, DO [place]

## 2022-12-12 NOTE — Progress Notes (Unsigned)
Patient did not connect for virtual psychiatric medication management appointment on 12/17/22 at 9:30AM. Sent secure video link with no response. Called phone with no answer; left VM with callback number to reschedule.  Alda Berthold, MD 12/17/22

## 2022-12-17 ENCOUNTER — Encounter (HOSPITAL_COMMUNITY): Payer: Medicaid Other | Admitting: Psychiatry

## 2022-12-17 ENCOUNTER — Encounter (HOSPITAL_COMMUNITY): Payer: Self-pay
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
## 2022-12-24 ENCOUNTER — Encounter (HOSPITAL_COMMUNITY): Payer: Self-pay | Admitting: Psychiatry

## 2022-12-24 ENCOUNTER — Other Ambulatory Visit (HOSPITAL_COMMUNITY): Payer: Self-pay | Admitting: Psychiatry

## 2022-12-24 ENCOUNTER — Telehealth (INDEPENDENT_AMBULATORY_CARE_PROVIDER_SITE_OTHER): Payer: Medicaid Other | Admitting: Psychiatry

## 2022-12-24 ENCOUNTER — Other Ambulatory Visit (HOSPITAL_COMMUNITY): Payer: Self-pay | Admitting: Physician Assistant

## 2022-12-24 DIAGNOSIS — F331 Major depressive disorder, recurrent, moderate: Secondary | ICD-10-CM

## 2022-12-24 DIAGNOSIS — F411 Generalized anxiety disorder: Secondary | ICD-10-CM

## 2022-12-24 DIAGNOSIS — F410 Panic disorder [episodic paroxysmal anxiety] without agoraphobia: Secondary | ICD-10-CM | POA: Diagnosis not present

## 2022-12-24 MED ORDER — VENLAFAXINE HCL ER 37.5 MG PO CP24: ORAL_CAPSULE | ORAL | 1 refills | Status: DC

## 2022-12-24 NOTE — Patient Instructions (Signed)
Thank you for attending your appointment today.  -- START Effexor 37.5 mg daily for 1 week then INCREASE to 75 mg daily thereafter  Please do not make any changes to medications without first discussing with your provider. If you are experiencing a psychiatric emergency, please call 911 or present to your nearest emergency department. Additional crisis, medication management, and therapy resources are included below.  [facility]  [address] [place], [place] [address] [phone] WALK-IN URGENT CARE 24/7 FOR ANYONE [address], [place], [place]  [phone] Fax: [phone] [email or web address] *Interpreters available *Accepts all insurance and uninsured for Urgent Care needs *Accepts Medicaid and uninsured for outpatient treatment (below)      ONLY FOR [facility]  Below:    Outpatient New Patient Assessment/Therapy Walk-ins:        Monday -Thursday 8am until slots are full.        Every Friday 1pm-4pm  (first come, first served)                   New Patient Psychiatry/Medication Management        Monday-Friday 8am-11am (first come, first served)               For all walk-ins we ask that you arrive by 7:15am, because patients will be seen in the order of arrival.

## 2022-12-26 ENCOUNTER — Other Ambulatory Visit (HOSPITAL_COMMUNITY): Payer: Self-pay | Admitting: Psychiatry

## 2022-12-26 ENCOUNTER — Other Ambulatory Visit (HOSPITAL_COMMUNITY): Payer: Medicaid Other

## 2022-12-28 LAB — CBC WITH DIFFERENTIAL/PLATELET
Basophils Absolute: 0 10*3/uL (ref 0.0–0.2)
Basos: 1 %
EOS (ABSOLUTE): 0.1 10*3/uL (ref 0.0–0.4)
Eos: 3 %
Hematocrit: 44.7 % (ref 34.0–46.6)
Hemoglobin: 14.3 g/dL (ref 11.1–15.9)
Immature Grans (Abs): 0 10*3/uL (ref 0.0–0.1)
Immature Granulocytes: 1 %
Lymphocytes Absolute: 1.2 10*3/uL (ref 0.7–3.1)
Lymphs: 31 %
MCH: 26.4 pg — ABNORMAL LOW (ref 26.6–33.0)
MCHC: 32 g/dL (ref 31.5–35.7)
MCV: 83 fL (ref 79–97)
Monocytes Absolute: 0.2 10*3/uL (ref 0.1–0.9)
Monocytes: 5 %
Neutrophils Absolute: 2.4 10*3/uL (ref 1.4–7.0)
Neutrophils: 59 %
Platelets: 287 10*3/uL (ref 150–450)
RBC: 5.41 x10E6/uL — ABNORMAL HIGH (ref 3.77–5.28)
RDW: 14.5 % (ref 11.7–15.4)
WBC: 4 10*3/uL (ref 3.4–10.8)

## 2022-12-28 LAB — COMPREHENSIVE METABOLIC PANEL

## 2022-12-28 LAB — LIPID PANEL

## 2022-12-28 LAB — THYROID PANEL WITH TSH

## 2022-12-28 LAB — HEMOGLOBIN A1C

## 2022-12-28 LAB — SPECIMEN STATUS REPORT

## 2022-12-28 LAB — VITAMIN D 25 HYDROXY (VIT D DEFICIENCY, FRACTURES): Vit D, 25-Hydroxy: 8.4 ng/mL — ABNORMAL LOW (ref 30.0–100.0)

## 2023-01-05 ENCOUNTER — Other Ambulatory Visit (HOSPITAL_COMMUNITY): Payer: Self-pay | Admitting: Physician Assistant

## 2023-01-05 DIAGNOSIS — F331 Major depressive disorder, recurrent, moderate: Secondary | ICD-10-CM

## 2023-01-12 IMAGING — US US MFM OB FOLLOW-UP
1 series · 12 of 28 positions shown · non-contrast
Comparison: none

[Series 1: us mfm ob follow-up · 12 of 87 slices shown]
[im 4/87]
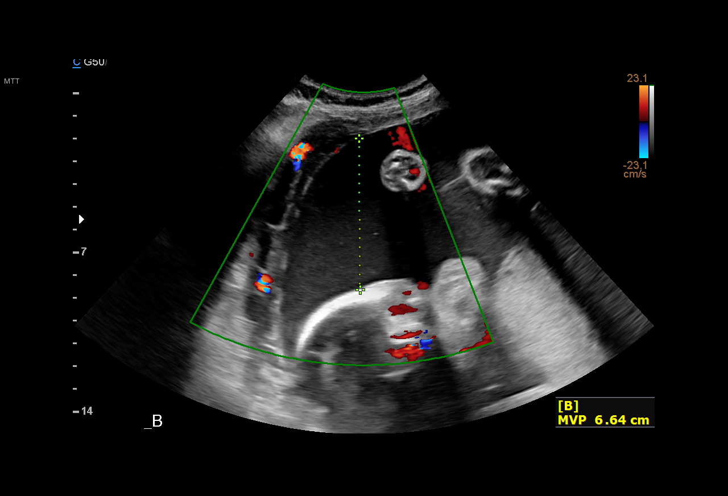
[im 10/87]
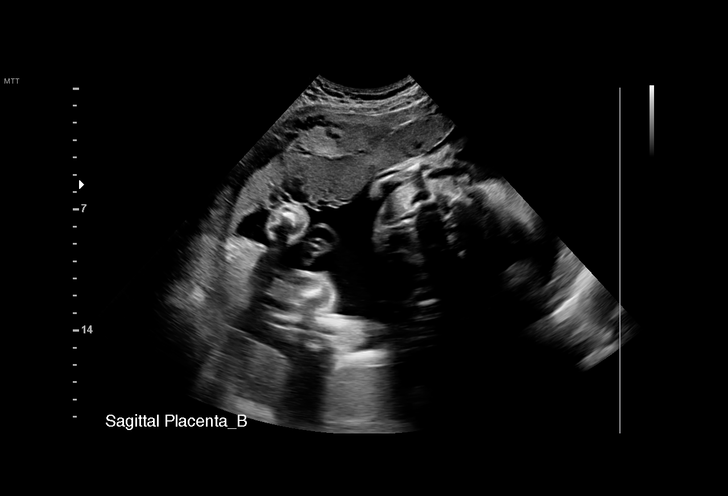
[im 16/87]
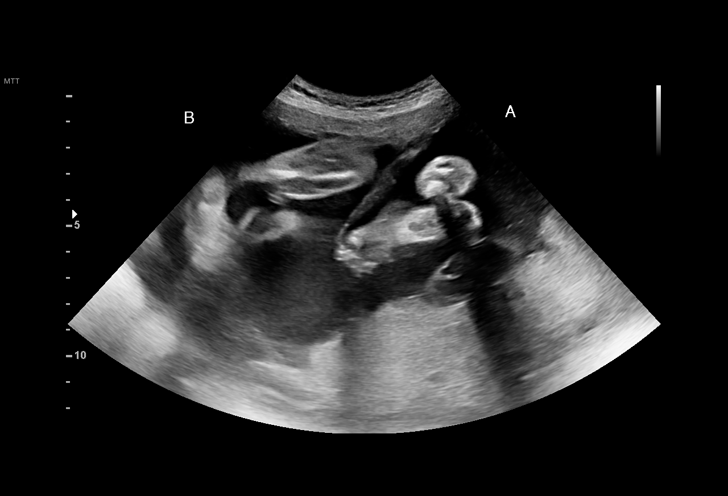
[im 26/87]
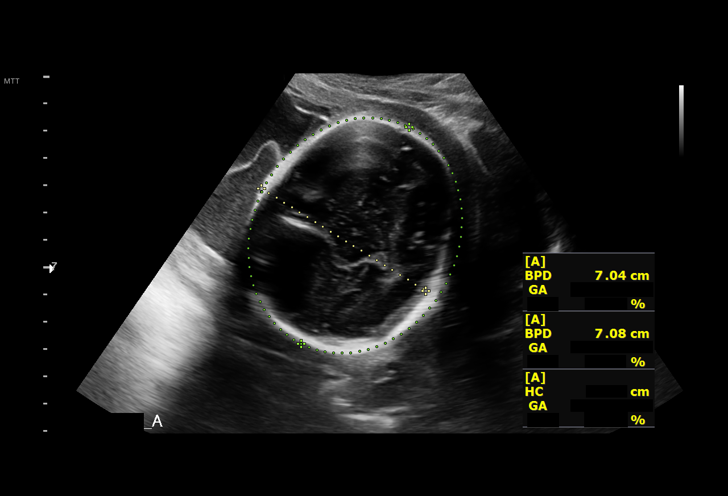
[im 32/87]
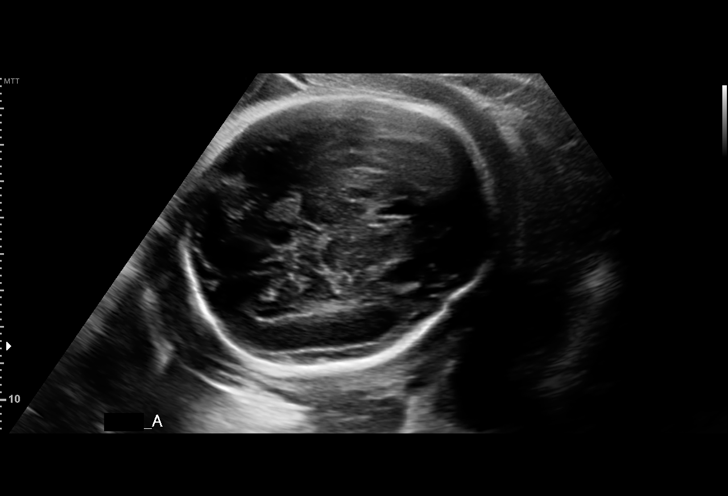
[im 39/87]
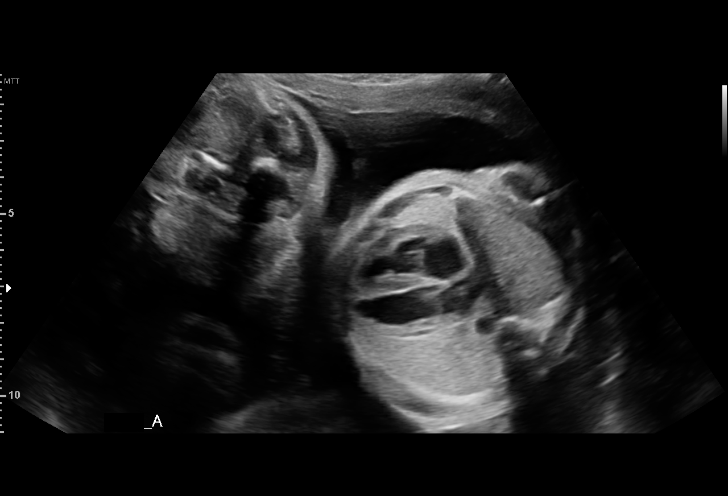
[im 48/87]
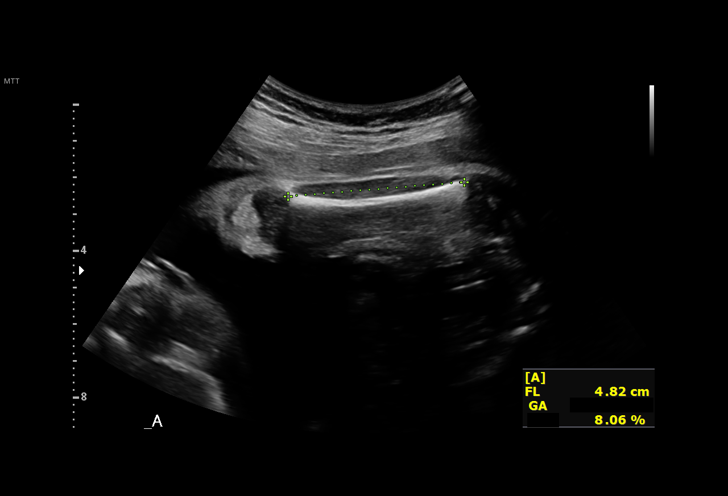
[im 55/87]
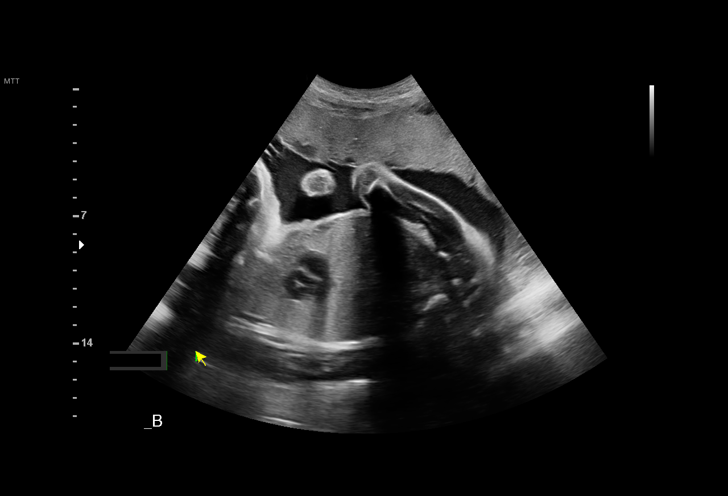
[im 61/87]
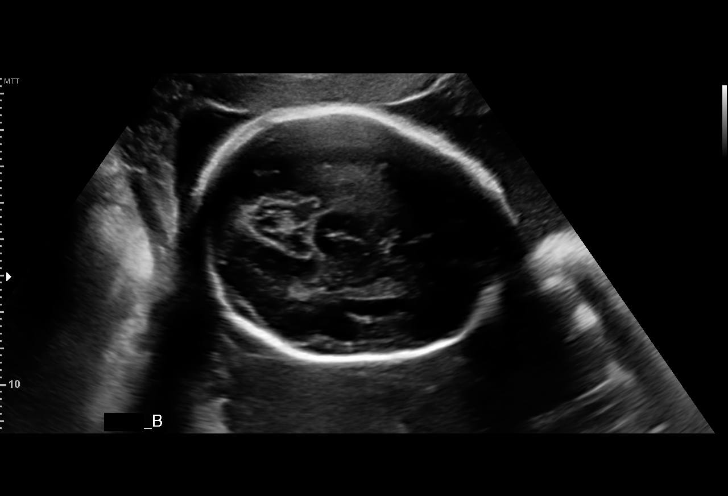
[im 71/87]
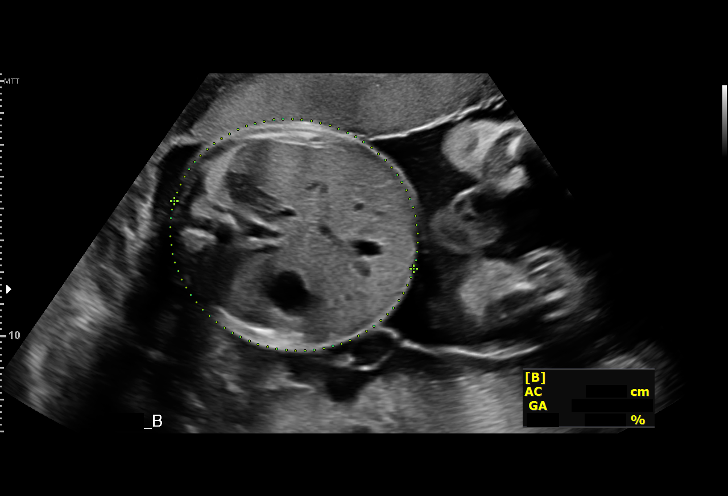
[im 77/87]
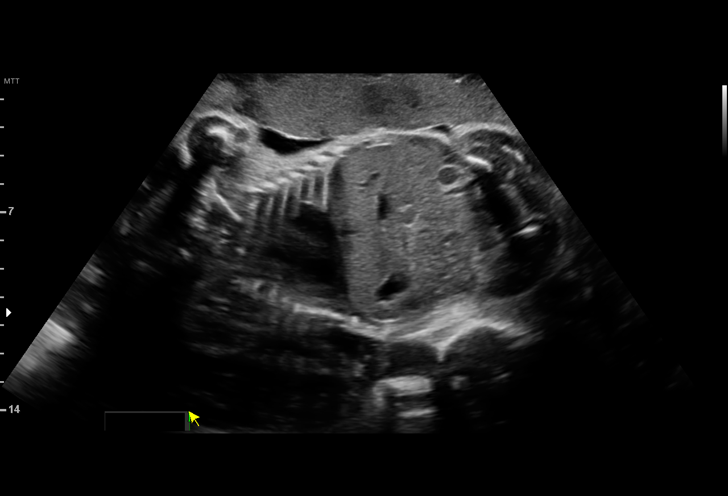
[im 83/87]
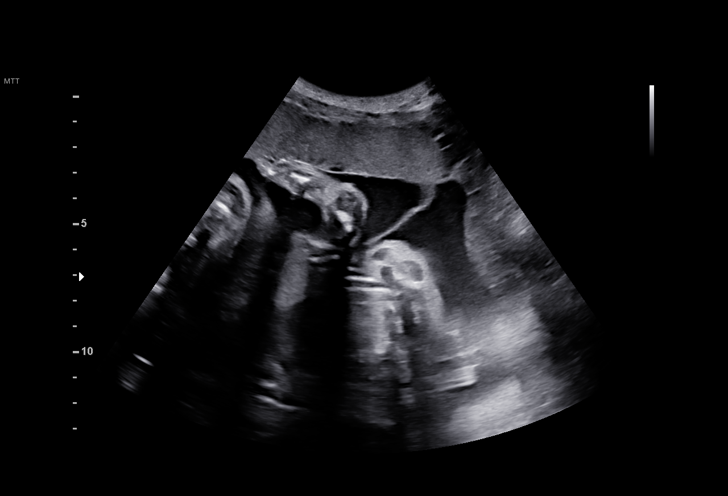

[12 of 28 positions shown; findings below may reference images not displayed]

GEST

Indications

 Twin pregnancy, di/di, second trimester
 27 weeks gestation of pregnancy
 Other mental disorder complicating
 pregnancy, second trimester
 LR NIPS/ Negative Horizon/ Negative AFP
 Encounter for other antenatal screening
 follow-up
Fetal Evaluation (Fetus A)

 Num Of Fetuses:         2
 Fetal Heart Rate(bpm):  147
 Cardiac Activity:       Observed
 Fetal Lie:              Lower Maternal Left Fetus
 Presentation:           Cephalic
 Placenta:               Posterior
 P. Cord Insertion:      Visualized
 Membrane Desc:      Dividing Membrane seen - Dichorionic.

 Amniotic Fluid
 AFI FV:      Within normal limits

                             Largest Pocket(cm)

Biometry (Fetus A)

 BPD:        71  mm     G. Age:  28w 4d         74  %    CI:        77.39   %    70 - 86
                                                         FL/HC:      19.1   %    18.6 -
 HC:      255.5  mm     G. Age:  27w 5d         31  %    HC/AC:      1.12        1.05 -
 AC:      228.9  mm     G. Age:  27w 2d         37  %    FL/BPD:     68.9   %    71 - 87
 FL:       48.9  mm     G. Age:  26w 3d         12  %    FL/AC:      21.4   %    20 - 24
 LV:        3.6  mm

 Est. FW:    8553  gm      2 lb 4 oz     26  %     FW Discordancy         4  %
OB History

 Blood Type:   O+
 Gravidity:    4         Term:   3        Prem:   0        SAB:   0
 TOP:          0       Ectopic:  0        Living: 3
Gestational Age (Fetus A)

 U/S Today:     27w 4d                                        EDD:   07/03/21
 Best:          27w 3d     Det. By:  Early Ultrasound         EDD:   07/04/21
                                     (11/21/20)
Anatomy (Fetus A)

 Cranium:               Appears normal         Aortic Arch:            Previously seen
 Cavum:                 Previously seen        Ductal Arch:            Previously seen
 Ventricles:            Appears normal         Diaphragm:              Appears normal
 Choroid Plexus:        Previously seen        Stomach:                Appears normal, left
                                                                       sided
 Cerebellum:            Previously seen        Abdomen:                Previously seen
 Posterior Fossa:       Previously seen        Abdominal Wall:         Previously seen
 Nuchal Fold:           Not applicable (>20    Cord Vessels:           Previously seen
                        wks GA)
 Face:                  Orbits and profile     Kidneys:                Appear normal
                        previously seen
 Lips:                  Previously seen        Bladder:                Appears normal
 Thoracic:              Appears normal         Spine:                  Previously seen
 Heart:                 Appears normal         Upper Extremities:      Previously seen
                        (4CH, axis, and
                        situs)
 RVOT:                  Previously seen        Lower Extremities:      Previously seen
 LVOT:                  Previously seen

 Other:  Fetus appears to be female. Lenses prev visualized. Nasal bone prev
         visualized. Heels/feet and open hands/5th digits prev visualized.
         Technically difficult due to fetal position.

Fetal Evaluation (Fetus B)

 Num Of Fetuses:         2
 Fetal Heart Rate(bpm):  135
 Cardiac Activity:       Observed
 Fetal Lie:              Maternal Right Fetus
 Presentation:           Cephalic
 Placenta:               Anterior
 P. Cord Insertion:      Visualized
 Membrane Desc:      Dividing Membrane seen - Dichorionic.
 Amniotic Fluid
 AFI FV:      Within normal limits

                             Largest Pocket(cm)

Biometry (Fetus B)

 BPD:      68.4  mm     G. Age:  27w 4d         41  %    CI:        77.81   %    70 - 86
                                                         FL/HC:      20.2   %    18.6 -
 HC:      245.4  mm     G. Age:  26w 5d          7  %    HC/AC:      1.03        1.05 -
 AC:      237.6  mm     G. Age:  28w 0d         62  %    FL/BPD:     72.5   %    71 - 87
 FL:       49.6  mm     G. Age:  26w 5d         17  %    FL/AC:      20.9   %    20 - 24
 LV:        2.5  mm

 Est. FW:    1244  gm      2 lb 6 oz     38  %     FW Discordancy      0 \ 4 %
Gestational Age (Fetus B)

 U/S Today:     27w 2d                                        EDD:   07/05/21
 Best:          27w 3d     Det. By:  Early Ultrasound         EDD:   07/04/21
                                     (11/21/20)
Anatomy (Fetus B)

 Cranium:               Appears normal         Aortic Arch:            Previously seen
 Cavum:                 Previously seen        Ductal Arch:            Previously seen
 Ventricles:            Appears normal         Diaphragm:              Appears normal
 Choroid Plexus:        Previously seen        Stomach:                Appears normal, left
                                                                       sided
 Cerebellum:            Previously seen        Abdomen:                Appears normal
 Posterior Fossa:       Previously seen        Abdominal Wall:         Previously seen
 Nuchal Fold:           Not applicable (>20    Cord Vessels:           Previously seen
                        wks GA)
 Face:                  Orbits and profile     Kidneys:                Appear normal
                        previously seen
 Lips:                  Previously seen        Bladder:                Appears normal
 Thoracic:              Appears normal         Spine:                  Previously seen
 Heart:                 Appears normal         Upper Extremities:      Previously seen
                        (4CH, axis, and
                        situs)
 RVOT:                  Previously seen        Lower Extremities:      Previously seen
 LVOT:                  Previously seen

 Other:  Female gender previously seen. Heels/feet and Right open hand/5th
         digit prev visualized. Nasal bone prev visualized. Technically difficult
         due to fetal position.
Cervix Uterus Adnexa

 Cervix
 Not visualized (advanced GA >85wks)

 Uterus
 No abnormality visualized.

 Right Ovary
 Within normal limits.
 Left Ovary
 Within normal limits.

 Cul De Sac
 No free fluid seen.

 Adnexa
 No abnormality visualized.
Comments

 This patient was seen for a follow up growth scan due to a
 dichorionic, diamniotic twin gestation.  She denies any
 problems since her last exam.
 She was informed that the fetal growth and amniotic fluid
 level appears appropriate for her gestational age for both twin
 A and twin B.
 A follow up exam was scheduled in 4 weeks.

## 2023-01-18 ENCOUNTER — Encounter (HOSPITAL_COMMUNITY): Payer: Self-pay

## 2023-01-18 ENCOUNTER — Ambulatory Visit (INDEPENDENT_AMBULATORY_CARE_PROVIDER_SITE_OTHER): Payer: Medicaid Other | Admitting: Clinical

## 2023-01-18 DIAGNOSIS — F331 Major depressive disorder, recurrent, moderate: Secondary | ICD-10-CM

## 2023-01-18 NOTE — Progress Notes (Signed)
THERAPIST PROGRESS NOTE Virtual Visit via Video Note  I connected with JOSELINNE BERLINER on 01/18/23 at 11:00 AM EDT by a video enabled telemedicine application and verified that I am speaking with the correct person using two identifiers.  Location: Patient: home Provider: office   I discussed the limitations of evaluation and management by telemedicine and the availability of in person appointments. The patient expressed understanding and agreed to proceed.   Follow Up Instructions: I discussed the assessment and treatment plan with the patient. The patient was provided an opportunity to ask questions and all were answered. The patient agreed with the plan and demonstrated an understanding of the instructions.   The patient was advised to call back or seek an in-person evaluation if the symptoms worsen or if the condition fails to improve as anticipated.    Session Time: 20 minutes  Participation Level: Active  Behavioral Response: CasualAlertDepressed  Type of Therapy: Individual Therapy  Treatment Goals addressed: client will score less than a 5 on the GAD7 score  ProgressTowards Goals: Progressing  Interventions: CBT and Supportive  Summary:  MALANA ABDELFATTAH is a 37 y.o. female who presents for the scheduled appointment oriented times five, appropriately dressed, and friendly. Client denied hallucinations and delusions. Client reported on today she is doing fairly okay. Client reported she has been compliant with her medications but does not feel it is effective for her anxiety and depressive symptoms. Client reported her anxiety persists and she has "real bad" panic attacks. Client reported she has tried to think about what triggered her but has a hard time back tracking to what triggered her. Client reported her panic attack last occurred last night and cannot recall what caused it to happen. Client reported she does not sleep well at night and naps during the day. Client  reported she stays to herself most of the time. Client reported she cannot tolerate being around a lot of people. Client reported she has very minimal interaction with the public, family and friends. Client reported when she needs a break her older children help with the younger sibling. Client reported she has a bad memory. Client reported after finishing this meeting she wont remember what was said. Client reported her psychiatrist suggested she speak to a therapist but doe snot know what she wants to work on. Evidence of progress towards goal:  client reported medication compliance 7 days per week.  Suicidal/Homicidal: Nowithout intent/plan  Therapist Response:  Therapist began the appointment asking the client how she has been doing since last seen. Therapist used CBT to engage using active listening and positive emotional support. Therapist used CBT to ask the client open ended questions about medication compliance compared to ongoing symptoms. Therapist used CBT to ask the client about stressors contributing to her anxiety. Therapist used CBT ask the client to identify her progress with frequency of use with coping skills with continued practice in her daily activity.    Therapist assigned the client homework to write down what she would like to work on in therapy.    Plan: Return again in 5 weeks.  Diagnosis: major depressive disorder recurrent episode, moderate with anxious distress  Collaboration of Care: Patient refused AEB none requested by the client.  Patient/Guardian was advised Release of Information must be obtained prior to any record release in order to collaborate their care with an outside provider. Patient/Guardian was advised if they have not already done so to contact the registration department to sign all necessary  forms in order for [place] to release information regarding their care.   Consent: Patient/Guardian gives verbal consent for treatment and assignment of benefits  for services provided during this visit. Patient/Guardian expressed understanding and agreed to proceed.   Neena Rhymes Seydina Holliman, LCSW 01/18/2023
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
## 2023-01-28 ENCOUNTER — Telehealth (INDEPENDENT_AMBULATORY_CARE_PROVIDER_SITE_OTHER): Payer: Medicaid Other | Admitting: Psychiatry

## 2023-01-28 ENCOUNTER — Encounter (HOSPITAL_COMMUNITY): Payer: Self-pay | Admitting: Psychiatry

## 2023-01-28 ENCOUNTER — Encounter: Payer: Self-pay | Admitting: Student

## 2023-01-28 ENCOUNTER — Ambulatory Visit (INDEPENDENT_AMBULATORY_CARE_PROVIDER_SITE_OTHER): Payer: Medicaid Other | Admitting: Student

## 2023-01-28 VITALS — BP 131/80 | HR 64 | Temp 98.1°F | Ht 67.0 in | Wt 215.6 lb

## 2023-01-28 DIAGNOSIS — F411 Generalized anxiety disorder: Secondary | ICD-10-CM | POA: Diagnosis not present

## 2023-01-28 DIAGNOSIS — L259 Unspecified contact dermatitis, unspecified cause: Secondary | ICD-10-CM | POA: Diagnosis present

## 2023-01-28 DIAGNOSIS — F331 Major depressive disorder, recurrent, moderate: Secondary | ICD-10-CM

## 2023-01-28 DIAGNOSIS — F410 Panic disorder [episodic paroxysmal anxiety] without agoraphobia: Secondary | ICD-10-CM

## 2023-01-28 DIAGNOSIS — E559 Vitamin D deficiency, unspecified: Secondary | ICD-10-CM

## 2023-01-28 MED ORDER — HYDROCORTISONE 1 % EX OINT: 1.0000 | TOPICAL_OINTMENT | Freq: Two times a day (BID) | CUTANEOUS | 0 refills | Status: AC

## 2023-01-28 MED ORDER — ESCITALOPRAM OXALATE 10 MG PO TABS: 10.0000 mg | ORAL_TABLET | Freq: Every day | ORAL | 1 refills | Status: DC

## 2023-01-28 MED ORDER — VENLAFAXINE HCL ER 37.5 MG PO CP24: 37.5000 mg | ORAL_CAPSULE | Freq: Every day | ORAL | 1 refills | Status: DC

## 2023-01-28 MED ORDER — TRIAMCINOLONE ACETONIDE 0.5 % EX OINT: 1.0000 | TOPICAL_OINTMENT | Freq: Two times a day (BID) | CUTANEOUS | 0 refills | Status: AC

## 2023-01-28 MED ORDER — DIPHENHYDRAMINE HCL 50 MG PO TABS
25.0000 mg | ORAL_TABLET | Freq: Every day | ORAL | 0 refills | Status: DC
Start: 2023-01-28 — End: 2023-09-04

## 2023-01-28 MED ORDER — VITAMIN D3 1.25 MG (50000 UT) PO TABS: 50000.0000 [IU] | ORAL_TABLET | ORAL | 0 refills | Status: DC

## 2023-01-28 NOTE — Progress Notes (Signed)
  SUBJECTIVE:   CHIEF COMPLAINT / HPI:   Patient is here to discuss rash.  Started on her face and neck and is very itchy, irritated.  She did have braids in her hair, which she has since removed.  No fever.  No new perfumes, lotions or body washes.  Only new medication since the rash started is Effexor.    She has not been taking anything for relief.  She was seen previously in March for a rash that was diagnosed as tinea and treated with topical ketoconazole.  This rash is different.  Past Medical History:  Diagnosis Date   Anxiety    Depression    Headache(784.0)    Mood change 11/06/2019    Patient Care Team: Evelena Leyden, DO as PCP - General (Family Medicine) OBJECTIVE:  BP 131/80   Pulse 64   Temp 98.1 F (36.7 C)   Ht 5\' 7"  (1.702 m)   Wt 215 lb 9.6 oz (97.8 kg)   LMP  (LMP Unknown)   SpO2 96%   BMI 33.77 kg/m  Physical Exam Constitutional:      Appearance: Normal appearance.  HENT:     Mouth/Throat:     Mouth: Mucous membranes are moist.     Pharynx: Oropharynx is clear.  Eyes:     Extraocular Movements: Extraocular movements intact.     Conjunctiva/sclera: Conjunctivae normal.     Pupils: Pupils are equal, round, and reactive to light.  Cardiovascular:     Pulses: Normal pulses.     Heart sounds: Normal heart sounds.  Pulmonary:     Effort: Pulmonary effort is normal.     Breath sounds: Normal breath sounds.  Skin:    General: Skin is warm and dry.     Findings: Rash present.     Comments: Papular, pruritic rash over sides of neck, forehead  Neurological:     Mental Status: She is alert.      ASSESSMENT/PLAN:  Contact dermatitis, unspecified contact dermatitis type, unspecified trigger Assessment & Plan: Plan to treat with topical steroids.  Will do triamcinolone for body, and hydrocortisone low potency for the face, advised to avoid areas around eyes and mouth. Advised to use steroid cream for 1 week with topical emollient. Discussed avoiding  scented products. Discontinued venlafaxine in case this is a contributor for an allergic component, started Lexapro. Follow-up in 1 week if no improvement in rash.    Moderate episode of recurrent major depressive disorder (HCC) Assessment & Plan: Discontinue venlafaxine. Start Lexapro 10 mg daily.  Patient with persistent concerned about side effects, but discussed general tolerability of Lexapro.  Advised to let us know if she has any adverse effects.   Other orders -     Escitalopram Oxalate; Take 1 tablet (10 mg total) by mouth at bedtime.  Dispense: 30 tablet; Refill: 1 -     diphenhydrAMINE HCl; Take 0.5 tablets (25 mg total) by mouth at bedtime.  Dispense: 10 tablet; Refill: 0 -     Triamcinolone Acetonide; Apply 1 Application topically 2 (two) times daily.  Dispense: 30 g; Refill: 0 -     Hydrocortisone; Apply 1 Application topically 2 (two) times daily. Apply to face- avoiding around the mouth and eyes.  Dispense: 30 g; Refill: 0   No follow-ups on file. Darral Dash, DO 01/28/2023, 2:33 PM PGY-2, [facility]

## 2023-01-28 NOTE — Patient Instructions (Addendum)
It was great to see you! Thank you for allowing me to participate in your care!  - Start using triamcinolone ointment on your body irritated areas twice daily for 1 week. Apply thick layer of Vaseline over this. - Use hydrocortisone 1% on your face, avoiding around the eyes and mouth - Take 25- 50 mg oral benadryl at nighttime for the next week - STOP taking Effexor (Venlafaxine) and START taking Lexapro - Avoid scented products and lotions.  If in 1 week you are not having improvement, please let us know.  Please schedule an appointment at the front desk in 1 month.  Take care and seek immediate care sooner if you develop any concerns.   Dr. Darral Dash, DO [facility]

## 2023-01-28 NOTE — Assessment & Plan Note (Signed)
Plan to treat with topical steroids.  Will do triamcinolone for body, and hydrocortisone low potency for the face, advised to avoid areas around eyes and mouth. Advised to use steroid cream for 1 week with topical emollient. Discussed avoiding scented products. Discontinued venlafaxine in case this is a contributor for an allergic component, started Lexapro. Follow-up in 1 week if no improvement in rash.

## 2023-01-28 NOTE — Assessment & Plan Note (Addendum)
Discontinue venlafaxine.  MDQ without concern for bipolar disorder. Start Lexapro 10 mg daily.  Patient with persistent concerned about side effects, but discussed general tolerability of Lexapro.  Advised to let us know if she has any adverse effects.

## 2023-01-28 NOTE — Progress Notes (Deleted)
    SUBJECTIVE:   CHIEF COMPLAINT / HPI:     PERTINENT  PMH / PSH: ***  OBJECTIVE:   BP 131/80   Pulse 64   Temp 98.1 F (36.7 C)   Ht 5\' 7"  (1.702 m)   Wt 215 lb 9.6 oz (97.8 kg)   LMP  (LMP Unknown)   SpO2 96%   BMI 33.77 kg/m  ***   ASSESSMENT/PLAN:   No problem-specific Assessment & Plan notes found for this encounter.     Darral Dash, DO [facility]    {    This will disappear when note is signed, click to select method of visit    :1}

## 2023-01-28 NOTE — Patient Instructions (Signed)
Thank you for attending your appointment today.  -- START Vitamin D3 50000 international units once weekly (set an alarm on your phone to remind you) -- CONTINUE Effexor 37.5 mg daily  --- Please do not make any changes to medications without first discussing with your provider. If you are experiencing a psychiatric emergency, please call 911 or present to your nearest emergency department. Additional crisis, medication management, and therapy resources are included below.  [facility]  [address] [place], [place] [address] [phone] WALK-IN URGENT CARE 24/7 FOR ANYONE [address], [place], [place]  [phone] Fax: [phone] [email or web address] *Interpreters available *Accepts all insurance and uninsured for Urgent Care needs *Accepts Medicaid and uninsured for outpatient treatment (below)      ONLY FOR [facility]  Below:    Outpatient New Patient Assessment/Therapy Walk-ins:        Monday -Thursday 8am until slots are full.        Every Friday 1pm-4pm  (first come, first served)                   New Patient Psychiatry/Medication Management        Monday-Friday 8am-11am (first come, first served)               For all walk-ins we ask that you arrive by 7:15am, because patients will be seen in the order of arrival.

## 2023-02-01 ENCOUNTER — Other Ambulatory Visit (HOSPITAL_COMMUNITY): Payer: Self-pay | Admitting: Physician Assistant

## 2023-02-19 ENCOUNTER — Other Ambulatory Visit: Payer: Self-pay | Admitting: Student

## 2023-03-08 ENCOUNTER — Encounter (HOSPITAL_COMMUNITY): Payer: Self-pay

## 2023-03-08 ENCOUNTER — Ambulatory Visit (HOSPITAL_COMMUNITY): Payer: Medicaid Other | Admitting: Licensed Clinical Social Worker

## 2023-03-08 ENCOUNTER — Telehealth (HOSPITAL_COMMUNITY): Payer: Self-pay | Admitting: Licensed Clinical Social Worker

## 2023-03-08 NOTE — Telephone Encounter (Signed)
LCSW sent two links to pt phone for 0900 appointment. Text links sent to [phone] at 0900 and 0905. At 0910 LCSW f/u with PC, LCSW left HIPAA compliant message. LCSW stay in virtual links until 0915 before disconnecting.

## 2023-03-20 DIAGNOSIS — E559 Vitamin D deficiency, unspecified: Secondary | ICD-10-CM | POA: Diagnosis not present

## 2023-03-20 DIAGNOSIS — Z79899 Other long term (current) drug therapy: Secondary | ICD-10-CM | POA: Diagnosis not present

## 2023-03-20 DIAGNOSIS — G43809 Other migraine, not intractable, without status migrainosus: Secondary | ICD-10-CM | POA: Diagnosis not present

## 2023-03-20 DIAGNOSIS — E7800 Pure hypercholesterolemia, unspecified: Secondary | ICD-10-CM | POA: Diagnosis not present

## 2023-03-20 DIAGNOSIS — R7302 Impaired glucose tolerance (oral): Secondary | ICD-10-CM | POA: Diagnosis not present

## 2023-03-20 DIAGNOSIS — R5383 Other fatigue: Secondary | ICD-10-CM | POA: Diagnosis not present

## 2023-03-20 DIAGNOSIS — D539 Nutritional anemia, unspecified: Secondary | ICD-10-CM | POA: Diagnosis not present

## 2023-03-20 DIAGNOSIS — M25531 Pain in right wrist: Secondary | ICD-10-CM | POA: Diagnosis not present

## 2023-03-20 DIAGNOSIS — Z6834 Body mass index (BMI) 34.0-34.9, adult: Secondary | ICD-10-CM | POA: Diagnosis not present

## 2023-03-20 DIAGNOSIS — E6609 Other obesity due to excess calories: Secondary | ICD-10-CM | POA: Diagnosis not present

## 2023-03-20 NOTE — Progress Notes (Signed)
Patient connected for psychiatric medication appointment on 03/25/23 at 10:30AM however was noted to be driving. She reported she had forgotten about today's appointment and preferred to reschedule. Rescheduled to 03/27/23 at 3:30PM by video.  Daine Gip, MD 03/25/23   This encounter was created in error - please disregard.

## 2023-03-22 DIAGNOSIS — Z6834 Body mass index (BMI) 34.0-34.9, adult: Secondary | ICD-10-CM | POA: Diagnosis not present

## 2023-03-22 DIAGNOSIS — N926 Irregular menstruation, unspecified: Secondary | ICD-10-CM | POA: Diagnosis not present

## 2023-03-22 DIAGNOSIS — G43809 Other migraine, not intractable, without status migrainosus: Secondary | ICD-10-CM | POA: Diagnosis not present

## 2023-03-22 DIAGNOSIS — E7800 Pure hypercholesterolemia, unspecified: Secondary | ICD-10-CM | POA: Diagnosis not present

## 2023-03-22 DIAGNOSIS — R0602 Shortness of breath: Secondary | ICD-10-CM | POA: Diagnosis not present

## 2023-03-22 DIAGNOSIS — R946 Abnormal results of thyroid function studies: Secondary | ICD-10-CM | POA: Diagnosis not present

## 2023-03-22 DIAGNOSIS — R9431 Abnormal electrocardiogram [ECG] [EKG]: Secondary | ICD-10-CM | POA: Diagnosis not present

## 2023-03-22 DIAGNOSIS — M25531 Pain in right wrist: Secondary | ICD-10-CM | POA: Diagnosis not present

## 2023-03-25 ENCOUNTER — Encounter (HOSPITAL_COMMUNITY): Payer: Medicaid Other | Admitting: Psychiatry

## 2023-03-26 DIAGNOSIS — R942 Abnormal results of pulmonary function studies: Secondary | ICD-10-CM | POA: Diagnosis not present

## 2023-03-26 DIAGNOSIS — J9801 Acute bronchospasm: Secondary | ICD-10-CM | POA: Diagnosis not present

## 2023-03-26 DIAGNOSIS — R946 Abnormal results of thyroid function studies: Secondary | ICD-10-CM | POA: Diagnosis not present
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
## 2023-03-27 ENCOUNTER — Telehealth (INDEPENDENT_AMBULATORY_CARE_PROVIDER_SITE_OTHER): Payer: Medicaid Other | Admitting: Psychiatry

## 2023-03-27 ENCOUNTER — Encounter (HOSPITAL_COMMUNITY): Payer: Self-pay | Admitting: Psychiatry

## 2023-03-27 DIAGNOSIS — F411 Generalized anxiety disorder: Secondary | ICD-10-CM

## 2023-03-27 DIAGNOSIS — F331 Major depressive disorder, recurrent, moderate: Secondary | ICD-10-CM | POA: Diagnosis not present

## 2023-03-27 DIAGNOSIS — E559 Vitamin D deficiency, unspecified: Secondary | ICD-10-CM | POA: Diagnosis not present

## 2023-03-27 DIAGNOSIS — F410 Panic disorder [episodic paroxysmal anxiety] without agoraphobia: Secondary | ICD-10-CM

## 2023-03-27 DIAGNOSIS — F439 Reaction to severe stress, unspecified: Secondary | ICD-10-CM

## 2023-03-27 DIAGNOSIS — F4310 Post-traumatic stress disorder, unspecified: Secondary | ICD-10-CM | POA: Insufficient documentation

## 2023-03-27 MED ORDER — VITAMIN D (CHOLECALCIFEROL) 25 MCG (1000 UT) PO TABS: 1000.0000 [IU] | ORAL_TABLET | Freq: Every morning | ORAL | 5 refills | Status: DC

## 2023-03-27 MED ORDER — PRAZOSIN HCL 1 MG PO CAPS: 1.0000 mg | ORAL_CAPSULE | Freq: Every day | ORAL | 2 refills | Status: DC

## 2023-03-27 MED ORDER — ESCITALOPRAM OXALATE 10 MG PO TABS: 10.0000 mg | ORAL_TABLET | Freq: Every day | ORAL | 2 refills | Status: DC

## 2023-03-27 MED ORDER — VITAMIN D (CHOLECALCIFEROL) 25 MCG (1000 UT) PO TABS: 1000.0000 [IU] | ORAL_TABLET | Freq: Every day | ORAL | 5 refills | Status: DC

## 2023-03-27 NOTE — Patient Instructions (Signed)
Thank you for attending your appointment today.  -- START Prazosin 1 mg nightly -- Continue Lexapro 10 mg daily -- START Vitamin D3 1000 IU daily -- Continue other medications as prescribed.  Please do not make any changes to medications without first discussing with your provider. If you are experiencing a psychiatric emergency, please call 911 or present to your nearest emergency department. Additional crisis, medication management, and therapy resources are included below.  [facility]  [address] [place], [place] [address] [phone] WALK-IN URGENT CARE 24/7 FOR ANYONE [address], [place], [place]  [phone] Fax: [phone] [email or web address] *Interpreters available *Accepts all insurance and uninsured for Urgent Care needs *Accepts Medicaid and uninsured for outpatient treatment (below)      ONLY FOR [facility]  Below:    Outpatient New Patient Assessment/Therapy Walk-ins:        Monday -Thursday 8am until slots are full.        Every Friday 1pm-4pm  (first come, first served)                   New Patient Psychiatry/Medication Management        Monday-Friday 8am-11am (first come, first served)               For all walk-ins we ask that you arrive by 7:15am, because patients will be seen in the order of arrival.

## 2023-03-29 DIAGNOSIS — M25531 Pain in right wrist: Secondary | ICD-10-CM | POA: Diagnosis not present

## 2023-03-29 DIAGNOSIS — Z6833 Body mass index (BMI) 33.0-33.9, adult: Secondary | ICD-10-CM | POA: Diagnosis not present

## 2023-03-29 DIAGNOSIS — N926 Irregular menstruation, unspecified: Secondary | ICD-10-CM | POA: Diagnosis not present

## 2023-03-29 DIAGNOSIS — G43809 Other migraine, not intractable, without status migrainosus: Secondary | ICD-10-CM | POA: Diagnosis not present

## 2023-03-29 DIAGNOSIS — H547 Unspecified visual loss: Secondary | ICD-10-CM | POA: Diagnosis not present

## 2023-04-01 DIAGNOSIS — R9431 Abnormal electrocardiogram [ECG] [EKG]: Secondary | ICD-10-CM | POA: Diagnosis not present

## 2023-04-13 DIAGNOSIS — N926 Irregular menstruation, unspecified: Secondary | ICD-10-CM | POA: Diagnosis not present

## 2023-04-13 DIAGNOSIS — L508 Other urticaria: Secondary | ICD-10-CM | POA: Diagnosis not present

## 2023-04-18 DIAGNOSIS — M79641 Pain in right hand: Secondary | ICD-10-CM | POA: Diagnosis not present

## 2023-04-18 DIAGNOSIS — M65831 Other synovitis and tenosynovitis, right forearm: Secondary | ICD-10-CM | POA: Diagnosis not present

## 2023-04-18 DIAGNOSIS — M654 Radial styloid tenosynovitis [de Quervain]: Secondary | ICD-10-CM | POA: Diagnosis not present

## 2023-04-19 DIAGNOSIS — G43809 Other migraine, not intractable, without status migrainosus: Secondary | ICD-10-CM | POA: Diagnosis not present

## 2023-04-20 DIAGNOSIS — L508 Other urticaria: Secondary | ICD-10-CM | POA: Diagnosis not present

## 2023-04-20 DIAGNOSIS — Z6833 Body mass index (BMI) 33.0-33.9, adult: Secondary | ICD-10-CM | POA: Diagnosis not present

## 2023-04-23 DIAGNOSIS — J9801 Acute bronchospasm: Secondary | ICD-10-CM | POA: Diagnosis not present

## 2023-05-16 DIAGNOSIS — J302 Other seasonal allergic rhinitis: Secondary | ICD-10-CM | POA: Diagnosis not present

## 2023-05-19 ENCOUNTER — Encounter (HOSPITAL_COMMUNITY): Payer: Self-pay

## 2023-05-19 ENCOUNTER — Ambulatory Visit (HOSPITAL_COMMUNITY)
Admission: EM | Admit: 2023-05-19 | Discharge: 2023-05-19 | Disposition: A | Discharge status: Home / Self Care | Payer: Medicaid Other

## 2023-05-19 DIAGNOSIS — S29019A Strain of muscle and tendon of unspecified wall of thorax, initial encounter: Secondary | ICD-10-CM

## 2023-05-19 DIAGNOSIS — S161XXA Strain of muscle, fascia and tendon at neck level, initial encounter: Secondary | ICD-10-CM | POA: Diagnosis not present

## 2023-05-19 HISTORY — DX: Unspecified asthma, uncomplicated: J45.909

## 2023-05-19 MED ORDER — BACLOFEN 10 MG PO TABS: 10.0000 mg | ORAL_TABLET | Freq: Three times a day (TID) | ORAL | 0 refills | Status: DC | PRN

## 2023-05-19 MED ORDER — ACETAMINOPHEN 500 MG PO TABS: 1000.0000 mg | ORAL_TABLET | Freq: Four times a day (QID) | ORAL | 0 refills | Status: DC | PRN

## 2023-05-19 MED ORDER — CAMPHOR-MENTHOL-METHYL SAL 4-10-30 % EX CREA: TOPICAL_CREAM | CUTANEOUS | 0 refills | Status: AC
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
## 2023-05-19 NOTE — Discharge Instructions (Addendum)
Take medications as ordered.  If you choose not to take Motrin please consider taking Tylenol at 1000 mg 3 times a day. Please use little utilize Epson salt soaks with a 2 cup Epsom salt to 1 tub of hot water. Utilize over-the-counter rubs such as Bengay or Tiger balm.  May also consider using Salonpas patches or other patches of your choice. Expect pain to get worse over the next 2 days before it starts resolving. Continue stretching and moving as tolerated. If increase in headaches or nausea vomiting start please see the ER for further evaluation.

## 2023-05-21 ENCOUNTER — Ambulatory Visit (HOSPITAL_COMMUNITY): Payer: Medicaid Other | Admitting: Licensed Clinical Social Worker

## 2023-05-24 DIAGNOSIS — M542 Cervicalgia: Secondary | ICD-10-CM | POA: Diagnosis not present

## 2023-05-24 DIAGNOSIS — M546 Pain in thoracic spine: Secondary | ICD-10-CM | POA: Diagnosis not present

## 2023-05-24 DIAGNOSIS — M79671 Pain in right foot: Secondary | ICD-10-CM | POA: Diagnosis not present

## 2023-05-24 DIAGNOSIS — M545 Low back pain, unspecified: Secondary | ICD-10-CM | POA: Diagnosis not present

## 2023-05-29 ENCOUNTER — Telehealth (HOSPITAL_COMMUNITY): Payer: Medicaid Other | Admitting: Psychiatry
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
## 2023-06-19 ENCOUNTER — Encounter (HOSPITAL_COMMUNITY): Payer: Self-pay | Admitting: Psychiatry

## 2023-06-19 ENCOUNTER — Telehealth (INDEPENDENT_AMBULATORY_CARE_PROVIDER_SITE_OTHER): Payer: Medicaid Other | Admitting: Psychiatry

## 2023-06-19 DIAGNOSIS — F411 Generalized anxiety disorder: Secondary | ICD-10-CM | POA: Diagnosis not present

## 2023-06-19 DIAGNOSIS — F331 Major depressive disorder, recurrent, moderate: Secondary | ICD-10-CM

## 2023-06-19 DIAGNOSIS — E559 Vitamin D deficiency, unspecified: Secondary | ICD-10-CM

## 2023-06-19 DIAGNOSIS — F439 Reaction to severe stress, unspecified: Secondary | ICD-10-CM | POA: Diagnosis not present

## 2023-06-19 DIAGNOSIS — G4739 Other sleep apnea: Secondary | ICD-10-CM | POA: Diagnosis not present

## 2023-06-19 DIAGNOSIS — F410 Panic disorder [episodic paroxysmal anxiety] without agoraphobia: Secondary | ICD-10-CM | POA: Diagnosis not present

## 2023-06-19 MED ORDER — PRAZOSIN HCL 1 MG PO CAPS: 1.0000 mg | ORAL_CAPSULE | Freq: Every day | ORAL | 2 refills | Status: DC

## 2023-06-19 MED ORDER — VITAMIN D (CHOLECALCIFEROL) 25 MCG (1000 UT) PO TABS: 1000.0000 [IU] | ORAL_TABLET | Freq: Every morning | ORAL | 5 refills | Status: DC

## 2023-06-19 MED ORDER — ESCITALOPRAM OXALATE 10 MG PO TABS: 10.0000 mg | ORAL_TABLET | Freq: Every day | ORAL | 2 refills | Status: DC

## 2023-06-19 NOTE — Patient Instructions (Addendum)
Thank you for attending your appointment today.  -- START Prazosin 1 mg nightly -- START Vitamin D 1000 units daily -- Continue Lexapro 10 mg daily -- I have placed a referral for a sleep study. If you do not hear back in the next week please call their office at [phone] -- Get a pill box to help you remember to take your medications.  Here is one for $3 on Amazon [email or web address]  Please do not make any changes to medications without first discussing with your provider. If you are experiencing a psychiatric emergency, please call 911 or present to your nearest emergency department. Additional crisis, medication management, and therapy resources are included below.  [facility]  [address], [place], [place] [address] [phone] WALK-IN URGENT CARE 24/7 FOR ANYONE [address] [place], [place]  [phone] Fax: [phone] [email or web address] *Interpreters available *Accepts all insurance and uninsured for Urgent Care needs *Accepts Medicaid and uninsured for outpatient treatment (below)      ONLY FOR [facility]  Below:    Outpatient New Patient Assessment/Therapy Walk-ins:        Monday -Thursday 8am until slots are full.        Every Friday 1pm-4pm  (first come, first served)                   New Patient Psychiatry/Medication Management        Monday-Friday 8am-11am (first come, first served)               For all walk-ins we ask that you arrive by 7:15am, because patients will be seen in the order of arrival.

## 2023-06-28 DIAGNOSIS — E7800 Pure hypercholesterolemia, unspecified: Secondary | ICD-10-CM | POA: Diagnosis not present

## 2023-06-28 DIAGNOSIS — Z91018 Allergy to other foods: Secondary | ICD-10-CM | POA: Diagnosis not present

## 2023-06-28 DIAGNOSIS — Z6833 Body mass index (BMI) 33.0-33.9, adult: Secondary | ICD-10-CM | POA: Diagnosis not present

## 2023-06-28 DIAGNOSIS — L819 Disorder of pigmentation, unspecified: Secondary | ICD-10-CM | POA: Diagnosis not present

## 2023-07-18 DIAGNOSIS — L508 Other urticaria: Secondary | ICD-10-CM | POA: Diagnosis not present

## 2023-07-25 DIAGNOSIS — L816 Other disorders of diminished melanin formation: Secondary | ICD-10-CM | POA: Diagnosis not present

## 2023-08-01 ENCOUNTER — Institutional Professional Consult (permissible substitution): Payer: Medicaid Other | Admitting: Neurology

## 2023-08-01 ENCOUNTER — Telehealth: Payer: Self-pay | Admitting: Neurology

## 2023-08-01 NOTE — Telephone Encounter (Signed)
NS for new sleep consult.
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
## 2023-09-04 ENCOUNTER — Telehealth (HOSPITAL_COMMUNITY): Payer: Medicaid Other | Admitting: Psychiatry

## 2023-09-04 ENCOUNTER — Encounter (HOSPITAL_COMMUNITY): Payer: Self-pay | Admitting: Psychiatry

## 2023-09-04 DIAGNOSIS — E559 Vitamin D deficiency, unspecified: Secondary | ICD-10-CM

## 2023-09-04 DIAGNOSIS — G4700 Insomnia, unspecified: Secondary | ICD-10-CM

## 2023-09-04 DIAGNOSIS — F410 Panic disorder [episodic paroxysmal anxiety] without agoraphobia: Secondary | ICD-10-CM | POA: Diagnosis not present

## 2023-09-04 DIAGNOSIS — F411 Generalized anxiety disorder: Secondary | ICD-10-CM

## 2023-09-04 DIAGNOSIS — F331 Major depressive disorder, recurrent, moderate: Secondary | ICD-10-CM

## 2023-09-04 MED ORDER — VITAMIN D (CHOLECALCIFEROL) 25 MCG (1000 UT) PO TABS: 1000.0000 [IU] | ORAL_TABLET | Freq: Every morning | ORAL | 5 refills | Status: DC

## 2023-09-04 MED ORDER — FLUOXETINE HCL 10 MG PO CAPS: 10.0000 mg | ORAL_CAPSULE | Freq: Every day | ORAL | 2 refills | Status: DC

## 2023-09-04 NOTE — Patient Instructions (Signed)
Thank you for attending your appointment today.  -- DO NOT restart Lexapro  -- START Prozac 10 mg daily -- START Vitamin D3 1000 IU daily -- Continue other medications as prescribed.  Please do not make any changes to medications without first discussing with your provider. If you are experiencing a psychiatric emergency, please call 911 or present to your nearest emergency department. Additional crisis, medication management, and therapy resources are included below.  [facility]  [address] [place], [place] [address] [phone] WALK-IN URGENT CARE 24/7 FOR ANYONE [address] [place], [place]  [phone] Fax: [phone] [email or web address] *Interpreters available *Accepts all insurance and uninsured for Urgent Care needs *Accepts Medicaid and uninsured for outpatient treatment (below)      ONLY FOR [facility]  Below:    Outpatient New Patient Assessment/Therapy Walk-ins:        Monday, Wednesday, and Thursday 8am until slots are full (first come, first served)                   New Patient Psychiatry/Medication Management        Monday-Friday 8am-11am (first come, first served)               For all walk-ins we ask that you arrive by 7:15am, because patients will be seen in the order of arrival.

## 2023-09-20 ENCOUNTER — Other Ambulatory Visit (HOSPITAL_COMMUNITY): Payer: Self-pay | Admitting: Psychiatry

## 2023-09-24 DIAGNOSIS — L816 Other disorders of diminished melanin formation: Secondary | ICD-10-CM | POA: Diagnosis not present
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
## 2023-10-28 ENCOUNTER — Encounter (HOSPITAL_COMMUNITY): Payer: Self-pay | Admitting: Psychiatry

## 2023-10-28 ENCOUNTER — Telehealth (INDEPENDENT_AMBULATORY_CARE_PROVIDER_SITE_OTHER): Payer: Medicaid Other | Admitting: Psychiatry

## 2023-10-28 DIAGNOSIS — F439 Reaction to severe stress, unspecified: Secondary | ICD-10-CM

## 2023-10-28 DIAGNOSIS — F411 Generalized anxiety disorder: Secondary | ICD-10-CM

## 2023-10-28 DIAGNOSIS — E559 Vitamin D deficiency, unspecified: Secondary | ICD-10-CM

## 2023-10-28 DIAGNOSIS — F331 Major depressive disorder, recurrent, moderate: Secondary | ICD-10-CM | POA: Diagnosis not present

## 2023-10-28 DIAGNOSIS — F410 Panic disorder [episodic paroxysmal anxiety] without agoraphobia: Secondary | ICD-10-CM

## 2023-10-28 MED ORDER — VITAMIN D3 50 MCG (2000 UT) PO CAPS: 2000.0000 [IU] | ORAL_CAPSULE | Freq: Every morning | ORAL | 5 refills | Status: DC

## 2023-10-28 MED ORDER — FLUOXETINE HCL 20 MG PO CAPS: 20.0000 mg | ORAL_CAPSULE | Freq: Every morning | ORAL | 1 refills | Status: DC

## 2023-10-28 NOTE — Patient Instructions (Signed)
 Thank you for attending your appointment today.  -- INCREASE fluoxetine  to 20 mg in the morning -- START Vitamin D3 2000 IU in the morning -- Continue other medications as prescribed. -- Please call the sleep clinic to get your appointment scheduled.  Please do not make any changes to medications without first discussing with your provider. If you are experiencing a psychiatric emergency, please call 911 or present to your nearest emergency department. Additional crisis, medication management, and therapy resources are included below.  [facility]  [address], [place], [place] [address] [phone] WALK-IN URGENT CARE 24/7 FOR ANYONE [address], [place], [place]  [phone] Fax: [phone] [email or web address] *Interpreters available *Accepts all insurance and uninsured for Urgent Care needs *Accepts Medicaid and uninsured for outpatient treatment (below)      ONLY FOR [facility]  Below:    Outpatient New Patient Assessment/Therapy Walk-ins:        Monday, Wednesday, and Thursday 8am until slots are full (first come, first served)                   New Patient Psychiatry/Medication Management        Monday-Friday 8am-11am (first come, first served)               For all walk-ins we ask that you arrive by 7:15am, because patients will be seen in the order of arrival.

## 2023-12-12 NOTE — Progress Notes (Signed)
 Virtual Visit via Telephone Note  I connected with Sara Manning on 12/16/23 at 10:30 AM EDT by telephone and verified that I am speaking with the correct person using two identifiers.  [place] Provider[place] remote office in [place]   I discussed the limitations, risks, security and privacy concerns of performing an evaluation and management service by telephone and the availability of in person appointments. I also discussed with the patient that there may be a patient responsible charge related to this service. The patient expressed understanding and agreed to proceed.  Patient was scheduled for virtual psychiatric medication management appointment today 12/16/23 however [place] and [place] she is located in [place]. Reviewed telehealth requirement that she be located in the state. She is amenable to brief phone check in and being rescheduled.  She reports she has continued to take Prozac 20 mg daily; reports daily adherence and taking alongside vitamin D. Denies any side effects although denies significant benefit either. Reports she has been getting about 3-4 hours of sleep nightly; reports she can be woken up by the smallest noise and hard to go back to sleep. Has not called to reschedule sleep clinic evaluation and encouraged to do so. Requests medication to help with sleep and amenable to trial of Atarax PRN (does not recall past side effects; on chart review didn't like the "taste" of the medication).  Plan:  -- Continue Prozac 20 mg daily -- START Atarax 12.5-25 mg nightly PRN sleep -- Continue Vitamin D3 2000 IU daily  F/u next available appt in approx 7 weeks by video.   I discussed the assessment and treatment plan with the patient. The patient was provided an opportunity to ask questions and all were answered. The patient agreed with the plan and demonstrated an understanding of the instructions.   The patient was advised to call  back or seek an in-person evaluation if the symptoms worsen or if the condition fails to improve as anticipated.  I provided 15 minutes of non-face-to-face time during this encounter.  Daine Gip, MD 12/16/23

## 2023-12-16 ENCOUNTER — Telehealth (INDEPENDENT_AMBULATORY_CARE_PROVIDER_SITE_OTHER): Payer: Medicaid Other | Admitting: Psychiatry

## 2023-12-16 ENCOUNTER — Encounter (HOSPITAL_COMMUNITY): Payer: Self-pay | Admitting: Psychiatry

## 2023-12-16 DIAGNOSIS — F331 Major depressive disorder, recurrent, moderate: Secondary | ICD-10-CM

## 2023-12-16 DIAGNOSIS — F411 Generalized anxiety disorder: Secondary | ICD-10-CM

## 2023-12-16 MED ORDER — FLUOXETINE HCL 20 MG PO CAPS: 20.0000 mg | ORAL_CAPSULE | Freq: Every morning | ORAL | 1 refills | Status: DC

## 2023-12-16 MED ORDER — HYDROXYZINE HCL 25 MG PO TABS: 12.5000 mg | ORAL_TABLET | Freq: Every evening | ORAL | 1 refills | Status: DC | PRN

## 2023-12-16 NOTE — Patient Instructions (Signed)
 Thank you for attending your appointment today.  -- START Atarax 12.5-25 mg nightly as needed for sleep -- Continue other medications as prescribed.  Please do not make any changes to medications without first discussing with your provider. If you are experiencing a psychiatric emergency, please call 911 or present to your nearest emergency department. Additional crisis, medication management, and therapy resources are included below.  [facility]  [address] [place], [place] [address] [phone] WALK-IN URGENT CARE 24/7 FOR ANYONE [address] [place], [place]  [phone] Fax: [phone] [email or web address] *Interpreters available *Accepts all insurance and uninsured for Urgent Care needs *Accepts Medicaid and uninsured for outpatient treatment (below)      ONLY FOR [facility]  Below:    Outpatient New Patient Assessment/Therapy Walk-ins:        Monday, Wednesday, and Thursday 8am until slots are full (first come, first served)                   New Patient Psychiatry/Medication Management        Monday-Friday 8am-11am (first come, first served)               For all walk-ins we ask that you arrive by 7:15am, because patients will be seen in the order of arrival.

## 2024-01-08 ENCOUNTER — Other Ambulatory Visit (HOSPITAL_COMMUNITY): Payer: Self-pay | Admitting: Psychiatry
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
## 2024-02-05 ENCOUNTER — Telehealth (INDEPENDENT_AMBULATORY_CARE_PROVIDER_SITE_OTHER): Admitting: Psychiatry

## 2024-02-05 ENCOUNTER — Encounter (HOSPITAL_COMMUNITY): Payer: Self-pay | Admitting: Psychiatry

## 2024-02-05 DIAGNOSIS — E559 Vitamin D deficiency, unspecified: Secondary | ICD-10-CM | POA: Diagnosis not present

## 2024-02-05 DIAGNOSIS — F439 Reaction to severe stress, unspecified: Secondary | ICD-10-CM

## 2024-02-05 DIAGNOSIS — G4700 Insomnia, unspecified: Secondary | ICD-10-CM

## 2024-02-05 DIAGNOSIS — F411 Generalized anxiety disorder: Secondary | ICD-10-CM

## 2024-02-05 DIAGNOSIS — F331 Major depressive disorder, recurrent, moderate: Secondary | ICD-10-CM

## 2024-02-05 DIAGNOSIS — R5382 Chronic fatigue, unspecified: Secondary | ICD-10-CM

## 2024-02-05 MED ORDER — FLUOXETINE HCL 40 MG PO CAPS: 40.0000 mg | ORAL_CAPSULE | Freq: Every morning | ORAL | 2 refills | Status: DC

## 2024-02-05 MED ORDER — HYDROXYZINE HCL 25 MG PO TABS: 25.0000 mg | ORAL_TABLET | Freq: Every evening | ORAL | 2 refills | Status: DC | PRN

## 2024-02-05 NOTE — Patient Instructions (Signed)
 Thank you for attending your appointment today.  -- INCREASE Prozac  to 40 mg daily -- INCREASE Atarax  to 25-50 mg nightly as needed for sleep -- Continue other medications as prescribed.  Please do not make any changes to medications without first discussing with your provider. If you are experiencing a psychiatric emergency, please call 911 or present to your nearest emergency department. Additional crisis, medication management, and therapy resources are included below.  [facility]  [address] [place], [place] [address] [phone] WALK-IN URGENT CARE 24/7 FOR ANYONE [address], [place], [place]  [phone] Fax: [phone] [email or web address] *Interpreters available *Accepts all insurance and uninsured for Urgent Care needs *Accepts Medicaid and uninsured for outpatient treatment (below)      ONLY FOR [facility]  Below:    Outpatient New Patient Assessment/Therapy Walk-ins:        Monday, Wednesday, and Thursday 8am until slots are full (first come, first served)                   New Patient Psychiatry/Medication Management        Monday-Friday 8am-11am (first come, first served)               For all walk-ins we ask that you arrive by 7:15am, because patients will be seen in the order of arrival.

## 2024-02-26 ENCOUNTER — Other Ambulatory Visit (HOSPITAL_COMMUNITY)

## 2024-02-27 ENCOUNTER — Other Ambulatory Visit (HOSPITAL_COMMUNITY): Payer: Self-pay | Admitting: Psychiatry

## 2024-03-06 ENCOUNTER — Telehealth (HOSPITAL_COMMUNITY): Payer: Self-pay

## 2024-03-06 NOTE — Telephone Encounter (Signed)
 Hello,   Pt is requesting for a refill, for her HYDROXYZINE  to be sent to on file [facility].   Last seen: 05/21     JNL,CMA

## 2024-03-10 ENCOUNTER — Telehealth (HOSPITAL_COMMUNITY): Payer: Self-pay | Admitting: *Deleted

## 2024-03-10 NOTE — Telephone Encounter (Signed)
 [facility] on [address] for a [facility] day rx for her hydroxyzine . She has MCD so it would be a better use of her copay amount. She has a future appt on 04/08/24 with Dr Mercy. She should have enough medication till Aug so this request can be addressed at her next appt for a 90 day supply. I will forward concern to Dr to make her aware and she can address it with the patient.

## 2024-03-19 ENCOUNTER — Other Ambulatory Visit: Payer: Self-pay | Admitting: Student

## 2024-03-19 ENCOUNTER — Ambulatory Visit
Admission: RE | Admit: 2024-03-19 | Discharge: 2024-03-19 | Disposition: A | Discharge status: Home / Self Care | Source: Ambulatory Visit | Attending: Family Medicine | Admitting: Family Medicine

## 2024-03-19 ENCOUNTER — Encounter: Payer: Self-pay | Admitting: Student

## 2024-03-19 ENCOUNTER — Ambulatory Visit (INDEPENDENT_AMBULATORY_CARE_PROVIDER_SITE_OTHER): Admitting: Student

## 2024-03-19 VITALS — BP 128/92 | HR 85 | Ht 67.0 in | Wt 214.8 lb

## 2024-03-19 DIAGNOSIS — M546 Pain in thoracic spine: Secondary | ICD-10-CM

## 2024-03-19 MED ORDER — DICLOFENAC SODIUM 1 % EX GEL: 2.0000 g | Freq: Four times a day (QID) | CUTANEOUS | 0 refills | Status: DC

## 2024-03-19 MED ORDER — MELOXICAM 15 MG PO TABS
15.0000 mg | ORAL_TABLET | Freq: Every day | ORAL | 0 refills | Status: DC
Start: 2024-03-19 — End: 2024-05-27

## 2024-03-19 NOTE — Patient Instructions (Addendum)
 Pleasure to see you today.  For your back pain I have ordered x-ray of your back which you can complete at [facility] imaging on [address] [place], [place]  I have also sent in prescription for meloxicam  which you take once daily and Voltaren gel which you can apply over the affected area.  I recommend use of Tylenol  as needed to help with pain management.  You can also continue a muscle relaxant
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
## 2024-03-23 ENCOUNTER — Ambulatory Visit: Payer: Self-pay | Admitting: Student

## 2024-03-26 ENCOUNTER — Other Ambulatory Visit: Payer: Self-pay | Admitting: Student

## 2024-03-26 MED ORDER — BACLOFEN 10 MG PO TABS: 10.0000 mg | ORAL_TABLET | Freq: Three times a day (TID) | ORAL | 0 refills | Status: AC | PRN

## 2024-03-26 NOTE — Progress Notes (Signed)
 Refill Muscle relaxer (Baclofen )

## 2024-04-03 ENCOUNTER — Ambulatory Visit: Admitting: Student
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
## 2024-04-08 ENCOUNTER — Telehealth (HOSPITAL_COMMUNITY): Admitting: Psychiatry

## 2024-04-08 ENCOUNTER — Encounter (HOSPITAL_COMMUNITY): Payer: Self-pay | Admitting: Psychiatry

## 2024-04-08 DIAGNOSIS — F411 Generalized anxiety disorder: Secondary | ICD-10-CM | POA: Diagnosis not present

## 2024-04-08 DIAGNOSIS — F331 Major depressive disorder, recurrent, moderate: Secondary | ICD-10-CM | POA: Diagnosis not present

## 2024-04-08 DIAGNOSIS — R5382 Chronic fatigue, unspecified: Secondary | ICD-10-CM

## 2024-04-08 DIAGNOSIS — F439 Reaction to severe stress, unspecified: Secondary | ICD-10-CM

## 2024-04-08 DIAGNOSIS — G4700 Insomnia, unspecified: Secondary | ICD-10-CM

## 2024-04-08 MED ORDER — HYDROXYZINE HCL 25 MG PO TABS: 25.0000 mg | ORAL_TABLET | Freq: Every evening | ORAL | 0 refills | Status: DC | PRN

## 2024-04-08 MED ORDER — FLUOXETINE HCL 40 MG PO CAPS: 40.0000 mg | ORAL_CAPSULE | Freq: Every morning | ORAL | 0 refills | Status: DC

## 2024-04-08 MED ORDER — PRAZOSIN HCL 1 MG PO CAPS: 1.0000 mg | ORAL_CAPSULE | Freq: Every day | ORAL | 2 refills | Status: DC

## 2024-04-08 NOTE — Patient Instructions (Signed)
 Thank you for attending your appointment today.  -- START Prazosin  1 mg nightly -- Continue other medications as prescribed.  Please do not make any changes to medications without first discussing with your provider. If you are experiencing a psychiatric emergency, please call 911 or present to your nearest emergency department. Additional crisis, medication management, and therapy resources are included below.  [facility]  [address] [place], [place] [address] [phone] WALK-IN URGENT CARE 24/7 FOR ANYONE [address] [place], [place]  [phone] Fax: [phone] [email or web address] *Interpreters available *Accepts all insurance and uninsured for Urgent Care needs *Accepts Medicaid and uninsured for outpatient treatment (below)      ONLY FOR [facility]  Below:    Outpatient New Patient Assessment/Therapy Walk-ins:        Monday, Wednesday, and Thursday 8am until slots are full (first come, first served)                   New Patient Psychiatry/Medication Management        Monday-Friday 8am-11am (first come, first served)               For all walk-ins we ask that you arrive by 7:15am, because patients will be seen in the order of arrival.

## 2024-04-13 ENCOUNTER — Other Ambulatory Visit (INDEPENDENT_AMBULATORY_CARE_PROVIDER_SITE_OTHER)

## 2024-04-13 DIAGNOSIS — E559 Vitamin D deficiency, unspecified: Secondary | ICD-10-CM | POA: Diagnosis not present

## 2024-04-13 DIAGNOSIS — R5382 Chronic fatigue, unspecified: Secondary | ICD-10-CM

## 2024-04-13 NOTE — Progress Notes (Signed)
 Patient tolerated blood drawn well. Blood drawn in Left AC. No complaints noted.

## 2024-04-15 ENCOUNTER — Other Ambulatory Visit: Payer: Self-pay | Admitting: Student

## 2024-04-15 ENCOUNTER — Ambulatory Visit (HOSPITAL_COMMUNITY): Payer: Self-pay | Admitting: Psychiatry

## 2024-04-15 DIAGNOSIS — M546 Pain in thoracic spine: Secondary | ICD-10-CM

## 2024-04-15 LAB — COMPREHENSIVE METABOLIC PANEL WITH GFR
ALT: 11 IU/L (ref 0–32)
AST: 13 IU/L (ref 0–40)
Albumin: 3.8 g/dL — ABNORMAL LOW (ref 3.9–4.9)
Alkaline Phosphatase: 133 IU/L — ABNORMAL HIGH (ref 44–121)
BUN/Creatinine Ratio: 9 (ref 9–23)
BUN: 7 mg/dL (ref 6–20)
Bilirubin Total: 0.3 mg/dL (ref 0.0–1.2)
CO2: 20 mmol/L (ref 20–29)
Calcium: 8.8 mg/dL (ref 8.7–10.2)
Chloride: 101 mmol/L (ref 96–106)
Creatinine, Ser: 0.79 mg/dL (ref 0.57–1.00)
Globulin, Total: 3 g/dL (ref 1.5–4.5)
Glucose: 95 mg/dL (ref 70–99)
Potassium: 3.4 mmol/L — ABNORMAL LOW (ref 3.5–5.2)
Sodium: 139 mmol/L (ref 134–144)
Total Protein: 6.8 g/dL (ref 6.0–8.5)
eGFR: 99 mL/min/1.73 (ref >59)

## 2024-04-15 LAB — LIPID PANEL
Chol/HDL Ratio: 3.9 ratio (ref 0.0–4.4)
Cholesterol, Total: 198 mg/dL (ref 100–199)
HDL: 51 mg/dL (ref >39)
LDL Chol Calc (NIH): 131 mg/dL — ABNORMAL HIGH (ref 0–99)
Triglycerides: 88 mg/dL (ref 0–149)
VLDL Cholesterol Cal: 16 mg/dL (ref 5–40)

## 2024-04-15 LAB — HEMOGLOBIN A1C
Est. average glucose Bld gHb Est-mCnc: 108 mg/dL
Hgb A1c MFr Bld: 5.4 % (ref 4.8–5.6)

## 2024-04-15 LAB — VITAMIN D 25 HYDROXY (VIT D DEFICIENCY, FRACTURES): Vit D, 25-Hydroxy: 26.3 ng/mL — ABNORMAL LOW (ref 30.0–100.0)

## 2024-04-15 LAB — VITAMIN B12: Vitamin B-12: 397 pg/mL (ref 232–1245)

## 2024-04-15 LAB — TSH: TSH: 1.09 u[IU]/mL (ref 0.450–4.500)

## 2024-05-08 DIAGNOSIS — G43809 Other migraine, not intractable, without status migrainosus: Secondary | ICD-10-CM | POA: Diagnosis not present

## 2024-05-08 DIAGNOSIS — R413 Other amnesia: Secondary | ICD-10-CM | POA: Diagnosis not present

## 2024-05-08 DIAGNOSIS — Z6833 Body mass index (BMI) 33.0-33.9, adult: Secondary | ICD-10-CM | POA: Diagnosis not present

## 2024-05-14 ENCOUNTER — Encounter: Payer: Self-pay | Admitting: Neurology

## 2024-05-21 ENCOUNTER — Telehealth (HOSPITAL_COMMUNITY): Payer: Self-pay | Admitting: *Deleted

## 2024-05-21 NOTE — Telephone Encounter (Signed)
 Fax received from [employer] to inform provider Dr Mercy in this case of possible drug interactions between Sumatriptan  and Fluoxetine  HCL. Neither of those meds are prescribed at this time. The patient has an appt with the provider in 2 weeks. Will document the concern from [facility] for Dr to review at her appt,

## 2024-05-26 DIAGNOSIS — J309 Allergic rhinitis, unspecified: Secondary | ICD-10-CM | POA: Diagnosis not present

## 2024-05-26 NOTE — Progress Notes (Unsigned)
 NEUROLOGY CONSULTATION NOTE  Sara Manning MRN: [id number] DOB: [birth year]  Referring provider: Lamarr Riding, NP Primary care provider: Elna Redo, MD  Reason for consult:  migraine  Assessment/Plan:   Chronic migraine without aura, without status migrainosus, not intractable  Migraine prevention:   Recommended Botox.  As she has had adverse reaction to 2 gepants, I am hesitant about starting a mAb as it has a longer half life.  She has over 15 headache days a month and has failed multiple preventatives such as topiramate , venlafaxine , Qulipta and would avoid beta blocker due to asthma.  She wants to first read about it and she will let me know.  Information provided. Lifestyle modification discussed: Avoid rebound headache.  Discontinue acetaminophen  Diet Sleep hygiene Routine exercise Migraine rescue:  Eletriptan  40mg  Keep headache diary Follow up 7 months.  Total time spent on today's visit was 62 minutes dedicated to this patient today, preparing to see patient, examining the patient, ordering tests and/or medications and counseling the patient, documenting clinical information in the EHR or other health record, independently interpreting results and communicating results to the patient/family, discussing treatment and goals, answering patient's questions and coordinating care.    Subjective:  Sara Manning is a 38 year old right-handed female with history of asthma, depression and anxiety who presents for migraines.  History supplemented by referring provider's note.  CT head personally reviewed.   Onset:  2013.  They became daily after she had her twins in 2022 Location:  diffuse (bilateral frontal, temporal) Quality:  stabbing Intensity:  10/10.  Aura:  absent Prodrome:  absent Associated symptoms:  Photophobia, phonophobia, brief blurred vision, trouble remembering.  She denies associated nausea, vomiting unilateral numbness or weakness. Duration:  Slowly  builds up in the morning, severe by 12-2 PM and lasts until she falls asleep Frequency:  almost daily (mild 1 to 2 days a week, otherwise severe) Frequency of abortive medication: acetaminophen  1 to 2 pills every other day Triggers:  emotional stress Relieving factors:  sleep Activity:  aggravates  01/23/2016 CT HEAD WO:  Study within normal limits  Past NSAIDS/analgesics:  naproxen , ibuprofen , ketorolac  IM, meloxicam  Past abortive triptans:  rizatriptan  10mg , sumatriptan  tab Past abortive ergotamine:  none Past muscle relaxants:  cyclobenzaprine , tizanidine  Past anti-emetic:  ondansetron -ODT 4mg , promethazine  Past antihypertensive medications:  none Past antidepressant medications:  venlafaxine , mirtazapine , sertraline , escitalopram , trazodone  Past anticonvulsant medications:  topiramate , lamotrigine  Past anti-CGRP:  Ubrelvy (hives), Qulipta (hives) Past vitamins/Herbal/Supplements:  magnesium Past antihistamines/decongestants:  diphenhydramine , loratadine  Other past therapies:  none  Current NSAIDS/analgesics:  acetaminophen  Current triptans:  none Current ergotamine:  none Current anti-emetic:  none Current muscle relaxants:  baclofen  10mg  TID PRN Current Antihypertensive medications:  none Current Antidepressant medications:  fluoxetine  40mg  daily Current Anticonvulsant medications:  none Current anti-CGRP:  none Current Antihistamines/Decongestants:  none Current vitamins/supplements:  D3 Other therapy:  none Birth control:  none Other medications:  hydroxyzine    Caffeine:  Pepsi daily.  No coffee Alcohol:  no Smoker:  no Diet:  32 oz water daily.  Pepsi.  Eats only once a day.  Usually grilled and rice.  Only eats fried foods once a week. Exercise:  not routine but will try to run. Depression:  yes; Anxiety:  yes Other pain:  back pain Sleep hygiene:  poor.  Difficulty falling asleep and staying asleep.  Gets approximately 3-4 hours a night.  Takes a nap during the  day but denies excessive daytime sleepiness.    History of  TBI/concussion:  no Family history of headache:  mom Family history of cerebral aneurysm:  no   PAST MEDICAL HISTORY: Past Medical History:  Diagnosis Date   Anxiety    Asthma    Depression    Headache(784.0)    Mood change 11/06/2019    PAST SURGICAL HISTORY: Past Surgical History:  Procedure Laterality Date   WISDOM TOOTH EXTRACTION      MEDICATIONS: Current Outpatient Medications on File Prior to Visit  Medication Sig Dispense Refill   acetaminophen  (TYLENOL ) 500 MG tablet Take 2 tablets (1,000 mg total) by mouth every 6 (six) hours as needed for moderate pain. 30 tablet 0   baclofen  (LIORESAL ) 10 MG tablet Take 1 tablet (10 mg total) by mouth 3 (three) times daily as needed for muscle spasms. 30 each 0   Blood Pressure KIT Use weekly 1 kit 0   Camphor-Menthol -Methyl Sal 12-25-28 % CREA Apply to area of sore muscles 113 g 0   Cholecalciferol  (VITAMIN D3) 50 MCG (2000 UT) capsule Take 1 capsule (2,000 Units total) by mouth in the morning. 30 capsule 5   diclofenac  Sodium (VOLTAREN ) 1 % GEL Apply 2 g topically 4 (four) times daily. 100 g 0   FLUoxetine  (PROZAC ) 40 MG capsule Take 1 capsule (40 mg total) by mouth in the morning. 90 capsule 0   hydrocortisone  1 % ointment Apply 1 Application topically 2 (two) times daily. Apply to face- avoiding around the mouth and eyes. 30 g 0   hydrocortisone  2.5 % cream Apply topically 2 (two) times daily. (Patient taking differently: Apply 1 application. topically daily as needed (For eczma).) 40 g 0   hydrOXYzine  (ATARAX ) 25 MG tablet Take 1-2 tablets (25-50 mg total) by mouth at bedtime as needed (sleep). 180 tablet 0   ketoconazole  (NIZORAL ) 2 % cream Apply 1 Application topically daily. 60 g 1   meloxicam  (MOBIC ) 15 MG tablet Take 1 tablet (15 mg total) by mouth daily. 30 tablet 0   prazosin  (MINIPRESS ) 1 MG capsule Take 1 capsule (1 mg total) by mouth at bedtime. 30 capsule 2    SYMBICORT 160-4.5 MCG/ACT inhaler Inhale 2 puffs into the lungs in the morning and at bedtime.     triamcinolone  ointment (KENALOG ) 0.5 % Apply 1 Application topically 2 (two) times daily. 30 g 0   VENTOLIN HFA 108 (90 Base) MCG/ACT inhaler Inhale 1 puff into the lungs every 6 (six) hours as needed for shortness of breath.     No current facility-administered medications on file prior to visit.    ALLERGIES: Allergies  Allergen Reactions   Ibuprofen  Other (See Comments)    FAMILY HISTORY: Family History  Problem Relation Age of Onset   Bipolar disorder Mother    Diabetes Mother    Bipolar disorder Sister    Schizophrenia Sister     Objective:  Blood pressure 116/81, pulse 89, height 5' 7 (1.702 m), weight 216 lb 3.2 oz (98.1 kg), SpO2 98%. General: No acute distress.  Patient appears well-groomed.   Head:  Normocephalic/atraumatic Eyes:  fundi examined but not visualized Neck: supple, no paraspinal tenderness, full range of motion Heart: regular rate and rhythm Neurological Exam: Mental status: alert and oriented to person, place, and time, speech fluent and not dysarthric, language intact. Cranial nerves: CN I: not tested CN II: pupils equal, round and reactive to light, visual fields intact CN III, IV, VI:  full range of motion, no nystagmus, no ptosis CN V: facial sensation intact. CN VII:  upper and lower face symmetric CN VIII: hearing intact CN IX, X: gag intact, uvula midline CN XI: sternocleidomastoid and trapezius muscles intact CN XII: tongue midline Bulk & Tone: normal, no fasciculations. Motor:  muscle strength 5/5 throughout Sensation:  Pinprick and vibratory sensation intact. Deep Tendon Reflexes:  2+ throughout,  toes downgoing.   Finger to nose testing:  Without dysmetria.   Gait:  Normal station and stride.  Romberg negative.    Thank you for allowing me to take part in the care of this patient.  Juliene Dunnings, DO  CC:  Elna Redo, MD  Lamarr Riding, NP

## 2024-05-27 ENCOUNTER — Ambulatory Visit (INDEPENDENT_AMBULATORY_CARE_PROVIDER_SITE_OTHER): Admitting: Neurology

## 2024-05-27 ENCOUNTER — Other Ambulatory Visit: Payer: Self-pay | Admitting: Neurology

## 2024-05-27 ENCOUNTER — Encounter: Payer: Self-pay | Admitting: Neurology

## 2024-05-27 VITALS — BP 116/81 | HR 89 | Ht 67.0 in | Wt 216.2 lb

## 2024-05-27 DIAGNOSIS — G43709 Chronic migraine without aura, not intractable, without status migrainosus: Secondary | ICD-10-CM

## 2024-05-27 MED ORDER — ELETRIPTAN HYDROBROMIDE 40 MG PO TABS: 40.0000 mg | ORAL_TABLET | ORAL | 5 refills | Status: DC | PRN

## 2024-05-27 NOTE — Patient Instructions (Signed)
  Recommend starting Botox Take eletriptan  40mg  at earliest onset of headache.  May repeat dose once in 2 hours if needed.  Maximum 2 tablets in 24 hours. Limit use of pain relievers to no more than 9 days out of the month.  These medications include acetaminophen , NSAIDs (ibuprofen /Advil /Motrin , naproxen /Aleve , triptans (Imitrex /sumatriptan ), Excedrin, and narcotics.  This will help reduce risk of rebound headaches. Be aware of common food triggers: Routine exercise Stay adequately hydrated (aim for 64 oz water daily) Keep headache diary Maintain proper stress management Maintain proper sleep hygiene Do not skip meals Consider supplements:  magnesium citrate 400mg  daily, riboflavin 400mg  daily, coenzyme Q10 100mg  three times daily.
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
## 2024-06-04 ENCOUNTER — Telehealth (HOSPITAL_COMMUNITY): Admitting: Psychiatry

## 2024-06-04 ENCOUNTER — Encounter (HOSPITAL_COMMUNITY): Payer: Self-pay | Admitting: Psychiatry

## 2024-06-04 DIAGNOSIS — F331 Major depressive disorder, recurrent, moderate: Secondary | ICD-10-CM

## 2024-06-04 DIAGNOSIS — E559 Vitamin D deficiency, unspecified: Secondary | ICD-10-CM | POA: Diagnosis not present

## 2024-06-04 DIAGNOSIS — F4310 Post-traumatic stress disorder, unspecified: Secondary | ICD-10-CM

## 2024-06-04 DIAGNOSIS — G4700 Insomnia, unspecified: Secondary | ICD-10-CM | POA: Diagnosis not present

## 2024-06-04 DIAGNOSIS — F411 Generalized anxiety disorder: Secondary | ICD-10-CM | POA: Diagnosis not present

## 2024-06-04 DIAGNOSIS — F410 Panic disorder [episodic paroxysmal anxiety] without agoraphobia: Secondary | ICD-10-CM

## 2024-06-04 MED ORDER — FLUOXETINE HCL 40 MG PO CAPS: 40.0000 mg | ORAL_CAPSULE | Freq: Every morning | ORAL | 0 refills | Status: DC

## 2024-06-04 MED ORDER — VITAMIN D3 50 MCG (2000 UT) PO CAPS: 4000.0000 [IU] | ORAL_CAPSULE | Freq: Every morning | ORAL | 5 refills | Status: AC

## 2024-06-04 MED ORDER — FLUOXETINE HCL 20 MG PO CAPS: 20.0000 mg | ORAL_CAPSULE | Freq: Every morning | ORAL | 0 refills | Status: DC

## 2024-06-04 MED ORDER — HYDROXYZINE HCL 25 MG PO TABS: 25.0000 mg | ORAL_TABLET | Freq: Every evening | ORAL | 0 refills | Status: DC | PRN

## 2024-06-04 MED ORDER — PRAZOSIN HCL 1 MG PO CAPS: 1.0000 mg | ORAL_CAPSULE | Freq: Every day | ORAL | 2 refills | Status: DC

## 2024-06-04 NOTE — Patient Instructions (Signed)
 Thank you for attending your appointment today.  -- INCREASE Prozac  to 60 mg daily -- INCREASE Vitamin D  to 4000 IU daily -- START prazosin  1 mg nightly if you have not done so already -- Continue other medications as prescribed.  Please do not make any changes to medications without first discussing with your provider. If you are experiencing a psychiatric emergency, please call 911 or present to your nearest emergency department. Additional crisis, medication management, and therapy resources are included below.  [facility]  [address] [place], [place] [address] [phone] WALK-IN URGENT CARE 24/7 FOR ANYONE [address], [place], [place]  [phone] Fax: [phone] [email or web address] *Interpreters available *Accepts all insurance and uninsured for Urgent Care needs *Accepts Medicaid and uninsured for outpatient treatment (below)      ONLY FOR [facility]  Below:    Outpatient New Patient Assessment/Therapy Walk-ins:        Monday, Wednesday, and Thursday 8am until slots are full (first come, first served)                   New Patient Psychiatry/Medication Management        Monday-Friday 8am-11am (first come, first served)               For all walk-ins we ask that you arrive by 7:15am, because patients will be seen in the order of arrival.

## 2024-06-15 ENCOUNTER — Other Ambulatory Visit (HOSPITAL_COMMUNITY): Payer: Self-pay

## 2024-06-15 ENCOUNTER — Telehealth: Payer: Self-pay | Admitting: Pharmacy Technician

## 2024-06-15 ENCOUNTER — Telehealth: Payer: Self-pay

## 2024-06-15 NOTE — Telephone Encounter (Signed)
 PA has been submitted, and telephone encounter has been created. Please see telephone encounter dated 9.29.25.

## 2024-06-15 NOTE — Telephone Encounter (Signed)
 Pharmacy Patient Advocate Encounter   Received notification from Pt Calls Messages that prior authorization for BOTOX 200 is required/requested.   Insurance verification completed.   The patient is insured through [facility] .   Per test claim: PA required; PA submitted to above mentioned insurance via Latent Key/confirmation #/EOC [id number] Status is pending

## 2024-06-15 NOTE — Telephone Encounter (Signed)
 PA botox  needed

## 2024-06-18 NOTE — Telephone Encounter (Signed)
 Pharmacy Patient Advocate Encounter  Received notification from [employer] that Prior Authorization for BOTOX 200 has been DENIED.  Full denial letter will be uploaded to the media tab. See denial reason below.   PA #/Case ID/Reference #: [id number]

## 2024-06-23 ENCOUNTER — Telehealth: Payer: Self-pay | Admitting: Pharmacy Technician

## 2024-06-23 ENCOUNTER — Telehealth: Payer: Self-pay

## 2024-06-23 ENCOUNTER — Other Ambulatory Visit (HOSPITAL_COMMUNITY): Payer: Self-pay

## 2024-06-23 MED ORDER — NORTRIPTYLINE HCL 10 MG PO CAPS
10.0000 mg | ORAL_CAPSULE | Freq: Every day | ORAL | 5 refills | Status: AC
Start: 2024-06-23 — End: ?

## 2024-06-23 NOTE — Telephone Encounter (Signed)
 Pharmacy Patient Advocate Encounter  Received notification from [employer] that Prior Authorization for ELETRIPTAN  40MG  has been APPROVED from 10.7.25 to 10.7.26. Ran test claim, Copay is $4. This test claim was processed through [facility]- copay amounts may vary at other pharmacies due to pharmacy/plan contracts, or as the patient moves through the different stages of their insurance plan.   PA #/Case ID/Reference #: [id number]

## 2024-06-23 NOTE — Telephone Encounter (Signed)
 PA has been submitted, and telephone encounter has been created. Please see telephone encounter dated 10.7.25.

## 2024-06-23 NOTE — Telephone Encounter (Signed)
 Per Patient Eletriptan  PA is needed.

## 2024-06-23 NOTE — Telephone Encounter (Signed)
 Pharmacy Patient Advocate Encounter   Received notification from Pt Calls Messages that prior authorization for ELETRIPTAN  40MG  is required/requested.   Insurance verification completed.   The patient is insured through [facility].   Per test claim: PA required; PA submitted to above mentioned insurance via Latent Key/confirmation #/EOC [id number] Status is pending

## 2024-06-26 ENCOUNTER — Ambulatory Visit: Admitting: Neurology

## 2024-06-26 DIAGNOSIS — J309 Allergic rhinitis, unspecified: Secondary | ICD-10-CM | POA: Diagnosis not present

## 2024-07-17 ENCOUNTER — Other Ambulatory Visit: Payer: Self-pay | Admitting: Neurology
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
## 2024-07-29 ENCOUNTER — Telehealth (INDEPENDENT_AMBULATORY_CARE_PROVIDER_SITE_OTHER): Admitting: Psychiatry

## 2024-07-29 ENCOUNTER — Encounter (HOSPITAL_COMMUNITY): Payer: Self-pay | Admitting: Psychiatry

## 2024-07-29 DIAGNOSIS — F411 Generalized anxiety disorder: Secondary | ICD-10-CM | POA: Diagnosis not present

## 2024-07-29 DIAGNOSIS — F4310 Post-traumatic stress disorder, unspecified: Secondary | ICD-10-CM | POA: Diagnosis not present

## 2024-07-29 DIAGNOSIS — F331 Major depressive disorder, recurrent, moderate: Secondary | ICD-10-CM | POA: Diagnosis not present

## 2024-07-29 DIAGNOSIS — F410 Panic disorder [episodic paroxysmal anxiety] without agoraphobia: Secondary | ICD-10-CM

## 2024-07-29 MED ORDER — PRAZOSIN HCL 2 MG PO CAPS: ORAL_CAPSULE | ORAL | 2 refills | Status: AC

## 2024-07-29 MED ORDER — FLUOXETINE HCL 20 MG PO CAPS: 20.0000 mg | ORAL_CAPSULE | Freq: Every morning | ORAL | 0 refills | Status: AC

## 2024-07-29 MED ORDER — HYDROXYZINE HCL 25 MG PO TABS: 25.0000 mg | ORAL_TABLET | Freq: Every evening | ORAL | 0 refills | Status: AC | PRN

## 2024-07-29 MED ORDER — FLUOXETINE HCL 40 MG PO CAPS: 40.0000 mg | ORAL_CAPSULE | Freq: Every morning | ORAL | 0 refills | Status: AC

## 2024-07-29 NOTE — Patient Instructions (Addendum)
 Thank you for attending your appointment today.  -- INCREASE prazosin  to 2 mg nightly. If tolerating well after 1 week, increase to 4 mg nightly. -- Continue other medications as prescribed.  Please do not make any changes to medications without first discussing with your provider. If you are experiencing a psychiatric emergency, please call 911 or present to your nearest emergency department. Additional crisis, medication management, and therapy resources are included below.  [facility]  [address], [place], [place] [address] [phone] WALK-IN URGENT CARE 24/7 FOR ANYONE [address] [place], [place]  [phone] Fax: [phone] [email or web address] *Interpreters available *Accepts all insurance and uninsured for Urgent Care needs *Accepts Medicaid and uninsured for outpatient treatment (below)      ONLY FOR [facility]  Below:    Outpatient New Patient Assessment/Therapy Walk-ins:        Monday, Wednesday, and Thursday 8am until slots are full (first come, first served)                   New Patient Psychiatry/Medication Management        Monday-Friday 8am-11am (first come, first served)               For all walk-ins we ask that you arrive by 7:15am, because patients will be seen in the order of arrival.

## 2024-07-31 DIAGNOSIS — J309 Allergic rhinitis, unspecified: Secondary | ICD-10-CM | POA: Diagnosis not present

## 2024-09-15 ENCOUNTER — Ambulatory Visit: Admitting: Neurology

## 2024-09-24 ENCOUNTER — Ambulatory Visit (INDEPENDENT_AMBULATORY_CARE_PROVIDER_SITE_OTHER): Admitting: Student in an Organized Health Care Education/Training Program

## 2024-09-24 ENCOUNTER — Encounter (HOSPITAL_COMMUNITY): Payer: Self-pay | Admitting: Student in an Organized Health Care Education/Training Program

## 2024-09-24 VITALS — BP 135/88 | HR 93 | Ht 67.0 in | Wt 231.0 lb

## 2024-09-24 DIAGNOSIS — F331 Major depressive disorder, recurrent, moderate: Secondary | ICD-10-CM | POA: Diagnosis not present

## 2024-09-24 DIAGNOSIS — R5382 Chronic fatigue, unspecified: Secondary | ICD-10-CM

## 2024-09-24 DIAGNOSIS — G4739 Other sleep apnea: Secondary | ICD-10-CM

## 2024-09-24 DIAGNOSIS — F411 Generalized anxiety disorder: Secondary | ICD-10-CM

## 2024-09-24 DIAGNOSIS — F410 Panic disorder [episodic paroxysmal anxiety] without agoraphobia: Secondary | ICD-10-CM | POA: Diagnosis not present

## 2024-09-24 DIAGNOSIS — Z6836 Body mass index (BMI) 36.0-36.9, adult: Secondary | ICD-10-CM

## 2024-09-24 DIAGNOSIS — G4700 Insomnia, unspecified: Secondary | ICD-10-CM | POA: Diagnosis not present

## 2024-09-24 MED ORDER — BUPROPION HCL ER (XL) 150 MG PO TB24: 150.0000 mg | ORAL_TABLET | Freq: Every day | ORAL | 2 refills | Status: DC

## 2024-09-24 MED ORDER — GABAPENTIN 100 MG PO CAPS: 100.0000 mg | ORAL_CAPSULE | Freq: Every day | ORAL | 2 refills | Status: AC
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
## 2024-09-24 NOTE — Patient Instructions (Signed)
 [facility] Patient Name: Sara Manning Date of Visit: [09/24/2024  Provider Name: Dr. Homer  Dear Sara Manning, it was a pleasure seeing you today, and we appreciate the opportunity to care for you.   These are the changes we have made to your medications:  Stop Prozac  Stop Prazosin  Start Wellbutrin  XL 150 mg daily, for depression Start gabapentin  100 mg nightly for sleep difficulties, please call if you have side effects or feel that the medication isn't working Continue hydroxyzine  as needed for anxiety, we will discuss making changes to this at the next visit Restart taking vitamin D  4,000 international units  daily   Lifestyle Recommendations Sleep Hygiene: Aim for 7-9 hours of quality sleep each night. Establish a regular sleep routine and create a restful environment. Stress Management: Practice stress-reducing techniques such as mindfulness, meditation, deep breathing exercises, or hobbies that you enjoy. Smoking Cessation: If you smoke, consider quitting. We can provide resources and support to help you. Alcohol Consumption: Limit alcohol intake to moderate levels--up to one drink per day for women and up to two drinks per day for men. Routine Health Screenings: Stay up-to-date with routine health screenings and vaccinations as recommended.

## 2024-10-16 ENCOUNTER — Other Ambulatory Visit (HOSPITAL_COMMUNITY): Payer: Self-pay | Admitting: Student in an Organized Health Care Education/Training Program

## 2024-10-16 DIAGNOSIS — F331 Major depressive disorder, recurrent, moderate: Secondary | ICD-10-CM

## 2024-10-29 ENCOUNTER — Encounter (HOSPITAL_COMMUNITY): Admitting: Student in an Organized Health Care Education/Training Program

## 2024-11-11 ENCOUNTER — Institutional Professional Consult (permissible substitution): Admitting: Neurology

## 2024-12-24 ENCOUNTER — Ambulatory Visit: Admitting: Neurology
